# Patient Record
Sex: Female | Born: 1965 | Race: White | Hispanic: No | Marital: Married | Smoking: Former smoker
Health system: Southern US, Community
[De-identification: ages and names within clinical notes are randomized; demographics above are authoritative.]

## PROBLEM LIST (undated history)

## (undated) DIAGNOSIS — H359 Unspecified retinal disorder: Secondary | ICD-10-CM

## (undated) DIAGNOSIS — H269 Unspecified cataract: Secondary | ICD-10-CM

## (undated) DIAGNOSIS — N390 Urinary tract infection, site not specified: Secondary | ICD-10-CM

## (undated) DIAGNOSIS — Z87440 Personal history of urinary (tract) infections: Secondary | ICD-10-CM

## (undated) DIAGNOSIS — N200 Calculus of kidney: Secondary | ICD-10-CM

## (undated) DIAGNOSIS — N3281 Overactive bladder: Secondary | ICD-10-CM

## (undated) DIAGNOSIS — J42 Unspecified chronic bronchitis: Secondary | ICD-10-CM

## (undated) DIAGNOSIS — I10 Essential (primary) hypertension: Secondary | ICD-10-CM

## (undated) DIAGNOSIS — R32 Unspecified urinary incontinence: Secondary | ICD-10-CM

## (undated) DIAGNOSIS — K219 Gastro-esophageal reflux disease without esophagitis: Secondary | ICD-10-CM

## (undated) DIAGNOSIS — H3321 Serous retinal detachment, right eye: Secondary | ICD-10-CM

## (undated) DIAGNOSIS — Z8489 Family history of other specified conditions: Secondary | ICD-10-CM

## (undated) DIAGNOSIS — K8020 Calculus of gallbladder without cholecystitis without obstruction: Secondary | ICD-10-CM

## (undated) DIAGNOSIS — E669 Obesity, unspecified: Secondary | ICD-10-CM

## (undated) DIAGNOSIS — K635 Polyp of colon: Secondary | ICD-10-CM

## (undated) DIAGNOSIS — M1990 Unspecified osteoarthritis, unspecified site: Secondary | ICD-10-CM

## (undated) HISTORY — DX: Polyp of colon: K63.5

## (undated) HISTORY — DX: Urinary tract infection, site not specified: N39.0

## (undated) HISTORY — PX: CHOLECYSTECTOMY: SHX55

## (undated) HISTORY — PX: EYE SURGERY: SHX253

## (undated) HISTORY — DX: Serous retinal detachment, right eye: H33.21

## (undated) HISTORY — DX: Obesity, unspecified: E66.9

## (undated) HISTORY — DX: Calculus of gallbladder without cholecystitis without obstruction: K80.20

## (undated) HISTORY — PX: JOINT REPLACEMENT: SHX530

## (undated) HISTORY — DX: Unspecified cataract: H26.9

---

## 2006-09-26 HISTORY — PX: CYSTOSCOPY W/ STONE MANIPULATION: SHX1427

## 2011-10-14 HISTORY — PX: TUBAL LIGATION: SHX77

## 2012-03-28 ENCOUNTER — Emergency Department: Payer: BC Managed Care – PPO

## 2012-03-28 ENCOUNTER — Encounter: Payer: Self-pay | Admitting: *Deleted

## 2012-03-28 ENCOUNTER — Emergency Department (HOSPITAL_BASED_OUTPATIENT_CLINIC_OR_DEPARTMENT_OTHER): Payer: BC Managed Care – PPO

## 2012-03-28 ENCOUNTER — Emergency Department (INDEPENDENT_AMBULATORY_CARE_PROVIDER_SITE_OTHER): Payer: BC Managed Care – PPO

## 2012-03-28 ENCOUNTER — Emergency Department
Admission: EM | Admit: 2012-03-28 | Discharge: 2012-03-28 | Disposition: A | Discharge status: Home / Self Care | Payer: Self-pay | Source: Home / Self Care | Attending: Emergency Medicine | Admitting: Emergency Medicine

## 2012-03-28 DIAGNOSIS — X58XXXA Exposure to other specified factors, initial encounter: Secondary | ICD-10-CM

## 2012-03-28 DIAGNOSIS — M79646 Pain in unspecified finger(s): Secondary | ICD-10-CM

## 2012-03-28 DIAGNOSIS — M25539 Pain in unspecified wrist: Secondary | ICD-10-CM

## 2012-03-28 DIAGNOSIS — M654 Radial styloid tenosynovitis [de Quervain]: Secondary | ICD-10-CM

## 2012-03-28 DIAGNOSIS — M79609 Pain in unspecified limb: Secondary | ICD-10-CM

## 2012-03-28 DIAGNOSIS — S6980XA Other specified injuries of unspecified wrist, hand and finger(s), initial encounter: Secondary | ICD-10-CM

## 2012-03-28 DIAGNOSIS — S6990XA Unspecified injury of unspecified wrist, hand and finger(s), initial encounter: Secondary | ICD-10-CM

## 2012-03-28 HISTORY — DX: Unspecified urinary incontinence: R32

## 2012-03-28 HISTORY — DX: Unspecified retinal disorder: H35.9

## 2012-03-28 NOTE — ED Notes (Signed)
Pt states that she injured her RT thumb x 1 mth ago while mowing her yard. Since then she c/o pain, swelling, and heat. She has taken naproxen.
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
## 2012-03-29 NOTE — ED Provider Notes (Signed)
Agree with exam, assessment, and plan.   Lattie Haw, MD 03/29/12 [phone]

## 2012-04-06 ENCOUNTER — Emergency Department
Admission: EM | Admit: 2012-04-06 | Discharge: 2012-04-06 | Disposition: A | Discharge status: Home / Self Care | Payer: BC Managed Care – PPO | Source: Home / Self Care

## 2012-04-06 ENCOUNTER — Telehealth: Payer: Self-pay | Admitting: Family Medicine

## 2012-04-06 DIAGNOSIS — N390 Urinary tract infection, site not specified: Secondary | ICD-10-CM

## 2012-04-06 DIAGNOSIS — R82998 Other abnormal findings in urine: Secondary | ICD-10-CM

## 2012-04-06 DIAGNOSIS — R300 Dysuria: Secondary | ICD-10-CM

## 2012-04-06 LAB — POCT URINALYSIS DIP (MANUAL ENTRY)
Bilirubin, UA: NEGATIVE
Ketones, POC UA: NEGATIVE
Protein Ur, POC: 30
Spec Grav, UA: >=1.03 (ref 1.005–1.03)

## 2012-04-06 MED ORDER — CEPHALEXIN 500 MG PO CAPS: 500.0000 mg | ORAL_CAPSULE | Freq: Three times a day (TID) | ORAL | Status: AC

## 2012-04-06 NOTE — ED Provider Notes (Signed)
History     CSN: [id number]  Arrival date & time 04/06/12  1213   First MD Initiated Contact with Patient 04/06/12 1325      Chief Complaint  Patient presents with  . Dysuria    x 1 week  HPI Comments: DYSURIA Onset:  3-4 days Description: increased urination, change in urine color, worsening odor Modifying factors: none  Symptoms Urgency:  yes Frequency: no  Hesitancy:  no Hematuria:  no Flank Pain:  no Fever: no Nausea/Vomiting:  no Missed LMP: no STD exposure: no Discharge: minimal  Irritants: no Rash: no  Red Flags   More than 3 UTI's last 12 months:  no PMH of  Diabetes or Immunosuppression:  no Renal Disease/Calculi: yes; hx/o kidney stones in the past. Does not feel like previous UTIs.  Urinary Tract Abnormality:  no Instrumentation or Trauma: no  Pt reports having UTI approx 6 months ago with similar presentation treated with abx by PCP that resolved. No urine culture was done at that time.    Patient is a 46 y.o. female presenting with dysuria. The history is provided by the patient.  Dysuria  This is a new problem.    Past Medical History  Diagnosis Date  . Incontinence   . Retina disorder     Past Surgical History  Procedure Date  . Cesarean section   . Tubal ligation   . Kidney stone surgery     Family History  Problem Relation Age of Onset  . Hypertension Mother   . Depression Mother   . Hypertension Father   . Cancer Father     tongue/kidney    History  Substance Use Topics  . Smoking status: Former Games developer  . Smokeless tobacco: Not on file  . Alcohol Use: Yes    OB History    Grav Para Term Preterm Abortions TAB SAB Ect Mult Living                  Review of Systems  Genitourinary: Positive for dysuria.  All other systems reviewed and are negative.    Allergies  Review of patient's allergies indicates no known allergies.  Home Medications   Current Outpatient Rx  Name Route Sig Dispense Refill  . ASPIRIN 81  MG PO TABS Oral Take 81 mg by mouth daily.    Marland Kitchen ESTRACE PO Oral Take by mouth.    Marland Kitchen NAPROXEN SODIUM 220 MG PO TABS Oral Take 220 mg by mouth 2 (two) times daily with a meal.    . SOLIFENACIN SUCCINATE 10 MG PO TABS Oral Take 5 mg by mouth daily.    . CEPHALEXIN 500 MG PO CAPS Oral Take 1 capsule (500 mg total) by mouth 3 (three) times daily. 21 capsule 0    BP 146/85  Pulse 84  Temp 98.1 F (36.7 C) (Oral)  Resp 16  Ht 5\' 4"  (1.626 m)  Wt 238 lb (107.956 kg)  BMI 40.85 kg/m2  SpO2 96%  LMP 09/29/2011  Physical Exam  Constitutional: She appears well-developed and well-nourished.  HENT:  Head: Normocephalic and atraumatic.  Eyes: Conjunctivae are normal. Pupils are equal, round, and reactive to light.  Neck: Normal range of motion. Neck supple.  Cardiovascular: Normal rate and regular rhythm.   Pulmonary/Chest: Effort normal and breath sounds normal.  Abdominal: Soft. Bowel sounds are normal.       No suprapubic tenderness.    Musculoskeletal: Normal range of motion.  Neurological: She is alert.  ED Course  Procedures (including critical care time)   Labs Reviewed  POCT URINALYSIS DIP (MANUAL ENTRY) - Abnormal; Notable for the following:   URINE CULTURE   No results found.   1. Dark urine       MDM  Exam clinically consistent with UTI.  Will treat with keflex Urine culture.  Discussed infectious red flags.  Handout given.  Follow up as needed.     The patient and/or caregiver has been counseled thoroughly with regard to treatment plan and/or medications prescribed including dosage, schedule, interactions, rationale for use, and possible side effects and they verbalize understanding. Diagnoses and expected course of recovery discussed and will return if not improved as expected or if the condition worsens. Patient and/or caregiver verbalized understanding.              Floydene Flock, MD 04/06/12 1340

## 2012-04-06 NOTE — ED Provider Notes (Signed)
Agree with exam, assessment, and plan.   Lattie Haw, MD 04/06/12 [phone]

## 2012-04-06 NOTE — ED Notes (Signed)
Wanda Valencia complains of dark urine that has a foul odor for 1 week. Denies fever, chills or sweats.

## 2012-04-08 LAB — URINE CULTURE

## 2012-04-09 ENCOUNTER — Telehealth: Payer: Self-pay | Admitting: Family Medicine

## 2012-04-30 ENCOUNTER — Ambulatory Visit (INDEPENDENT_AMBULATORY_CARE_PROVIDER_SITE_OTHER): Payer: BC Managed Care – PPO | Admitting: Sports Medicine

## 2012-04-30 ENCOUNTER — Encounter: Payer: Self-pay | Admitting: Sports Medicine

## 2012-04-30 VITALS — BP 153/93 | HR 81 | Wt 239.0 lb

## 2012-04-30 DIAGNOSIS — M25549 Pain in joints of unspecified hand: Secondary | ICD-10-CM | POA: Insufficient documentation

## 2012-04-30 NOTE — Addendum Note (Signed)
Addended by: Monica Becton on: 04/30/2012 10:02 PM   Modules accepted: Level of Service
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
## 2012-04-30 NOTE — Patient Instructions (Signed)
Great to meet you. Continuing brace for another 2 weeks. Continue icing as needed. Stop Aleve, naproxen, ibuprofen, and Tylenol instead. Come back to see me in 2-3 weeks, and we can discuss. Wanda Valencia. Wanda Valencia, M.D.    .

## 2012-05-21 ENCOUNTER — Ambulatory Visit (INDEPENDENT_AMBULATORY_CARE_PROVIDER_SITE_OTHER): Payer: BC Managed Care – PPO | Admitting: Sports Medicine

## 2012-05-21 ENCOUNTER — Encounter: Payer: Self-pay | Admitting: Sports Medicine

## 2012-05-21 VITALS — BP 154/94 | HR 78 | Temp 98.2°F | Resp 16 | Wt 237.0 lb

## 2012-05-21 DIAGNOSIS — M25549 Pain in joints of unspecified hand: Secondary | ICD-10-CM

## 2012-05-21 NOTE — Progress Notes (Signed)
Patient ID: Wanda Valencia, female   DOB: 1966/07/16, 46 y.o.   MRN: [id number] Subjective:    CC: right thumb pain  HPI: overall pain is improved, not constant like before, but still present.  She localizes the to the right metacarpal phalangeal joint of the first digit.  She has been in immobilization in a thumb spica, and has been on oral NSAIDs.  She's also doing home rehabilitation exercises.  The pain is localized, and does not radiate.  Past medical history, Surgical history, Family history, Social history, Allergies, and medications have been entered into the medical record, reviewed, and no changes needed.   Review of Systems: No fevers, chills, night sweats, weight loss, chest pain, or shortness of breath.   Objective:    General: Well Developed, well nourished, and in no acute distress.  Neuro: Alert and oriented x3, extra-ocular muscles intact.  HEENT: Normocephalic, atraumatic, pupils equal round reactive to light, neck supple, no masses, no lymphadenopathy, thyroid nonpalpable.  Skin: Warm and dry, no rashes. Cardiac: Regular rate and rhythm, no murmurs rubs or gallops.  Respiratory: Clear to auscultation bilaterally. Not using accessory muscles, speaking in full sentences. Tender to palpation at the joint line at the first metacarpophalangeal joint.  Range of motion is good. She has a negative Finkelstein sign. There is no tenderness at the carpometacarpal joint.  Procedure: Real-time Ultrasound Guided Injection of right first metacarpophalangeal joint Device: GE Logiq E  Ultrasound guided injection is preferred based studies that show increased duration, increased effect, greater accuracy, decreased procedural pain, increased response rate, and decreased cost with ultrasound guided versus blind injection.  Verbal informed consent obtained.  Time-out conducted.  Noted no overlying erythema, induration, or other signs of local infection.  Skin prepped in a sterile fashion.    Local anesthesia: Topical Ethyl chloride.  With sterile technique and under real time ultrasound guidance:  Needle advanced in a short axis into the joint, 1/2 cc Kenalog 40, 1 cc lidocaine injected easily. Completed without difficulty  Pain immediately resolved suggesting accurate placement of the medication.  Advised to call if fevers/chills, erythema, induration, drainage, or persistent bleeding.  Images permanently stored and available for review in the ultrasound unit.  Impression: Technically successful ultrasound guided injection.  Impression and Recommendations:
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
## 2012-06-19 ENCOUNTER — Ambulatory Visit: Payer: BC Managed Care – PPO | Admitting: Sports Medicine

## 2012-07-17 ENCOUNTER — Ambulatory Visit (INDEPENDENT_AMBULATORY_CARE_PROVIDER_SITE_OTHER): Payer: BC Managed Care – PPO | Admitting: Sports Medicine

## 2012-07-17 ENCOUNTER — Ambulatory Visit (INDEPENDENT_AMBULATORY_CARE_PROVIDER_SITE_OTHER): Payer: BC Managed Care – PPO

## 2012-07-17 ENCOUNTER — Other Ambulatory Visit: Payer: Self-pay | Admitting: Sports Medicine

## 2012-07-17 ENCOUNTER — Encounter: Payer: Self-pay | Admitting: Sports Medicine

## 2012-07-17 VITALS — BP 161/88 | HR 84 | Temp 98.4°F | Resp 18 | Ht 63.0 in | Wt 237.0 lb

## 2012-07-17 DIAGNOSIS — M25562 Pain in left knee: Secondary | ICD-10-CM

## 2012-07-17 DIAGNOSIS — R030 Elevated blood-pressure reading, without diagnosis of hypertension: Secondary | ICD-10-CM

## 2012-07-17 DIAGNOSIS — R52 Pain, unspecified: Secondary | ICD-10-CM

## 2012-07-17 DIAGNOSIS — I10 Essential (primary) hypertension: Secondary | ICD-10-CM | POA: Insufficient documentation

## 2012-07-17 DIAGNOSIS — Z0000 Encounter for general adult medical examination without abnormal findings: Secondary | ICD-10-CM

## 2012-07-17 DIAGNOSIS — Z23 Encounter for immunization: Secondary | ICD-10-CM

## 2012-07-17 DIAGNOSIS — Z299 Encounter for prophylactic measures, unspecified: Secondary | ICD-10-CM

## 2012-07-17 DIAGNOSIS — M2240 Chondromalacia patellae, unspecified knee: Secondary | ICD-10-CM | POA: Insufficient documentation

## 2012-07-17 DIAGNOSIS — M25569 Pain in unspecified knee: Secondary | ICD-10-CM

## 2012-07-17 DIAGNOSIS — M1710 Unilateral primary osteoarthritis, unspecified knee: Secondary | ICD-10-CM

## 2012-07-17 DIAGNOSIS — M25549 Pain in joints of unspecified hand: Secondary | ICD-10-CM

## 2012-07-17 DIAGNOSIS — E669 Obesity, unspecified: Secondary | ICD-10-CM | POA: Insufficient documentation

## 2012-07-17 LAB — CBC
HCT: 41.5 % (ref 36.0–46.0)
Hemoglobin: 14.3 g/dL (ref 12.0–15.0)
MCH: 30.6 pg (ref 26.0–34.0)
MCHC: 34.5 g/dL (ref 30.0–36.0)
MCV: 88.9 fL (ref 78.0–100.0)
Platelets: 317 K/uL (ref 150–400)
RBC: 4.67 MIL/uL (ref 3.87–5.11)
RDW: 14 % (ref 11.5–15.5)
WBC: 6 10*3/uL (ref 4.0–10.5)

## 2012-07-17 LAB — HEMOGLOBIN A1C
Hgb A1c MFr Bld: 5.7 % — ABNORMAL HIGH (ref <5.7)
Mean Plasma Glucose: 117 mg/dL — ABNORMAL HIGH (ref <117)

## 2012-07-17 LAB — COMPREHENSIVE METABOLIC PANEL
Albumin: 4.4 g/dL (ref 3.5–5.2)
Alkaline Phosphatase: 90 U/L (ref 39–117)
BUN: 14 mg/dL (ref 6–23)
CO2: 27 mEq/L (ref 19–32)
Chloride: 105 mEq/L (ref 96–112)
Glucose, Bld: 87 mg/dL (ref 70–99)
Potassium: 4.6 mEq/L (ref 3.5–5.3)
Sodium: 140 mEq/L (ref 135–145)
Total Protein: 6.7 g/dL (ref 6.0–8.3)

## 2012-07-17 LAB — LIPID PANEL
Cholesterol: 199 mg/dL (ref 0–200)
HDL: 51 mg/dL (ref >39)
LDL Cholesterol: 130 mg/dL — ABNORMAL HIGH (ref 0–99)
Total CHOL/HDL Ratio: 3.9 Ratio
Triglycerides: 92 mg/dL (ref <150)
VLDL: 18 mg/dL (ref 0–40)

## 2012-07-17 LAB — TSH: TSH: 2.126 u[IU]/mL (ref 0.350–4.500)

## 2012-07-17 LAB — COMPREHENSIVE METABOLIC PANEL WITH GFR
ALT: 33 U/L (ref 0–35)
AST: 27 U/L (ref 0–37)
Calcium: 9.7 mg/dL (ref 8.4–10.5)
Creat: 0.72 mg/dL (ref 0.50–1.10)
Total Bilirubin: 0.5 mg/dL (ref 0.3–1.2)

## 2012-07-17 LAB — CHG MAMMOGRAM, SCREENING

## 2012-07-17 MED ORDER — PHENTERMINE HCL 37.5 MG PO TABS: 37.5000 mg | ORAL_TABLET | Freq: Every day | ORAL | Status: DC

## 2012-07-17 MED ORDER — MELOXICAM 15 MG PO TABS: ORAL_TABLET | ORAL | Status: DC

## 2012-07-17 NOTE — Patient Instructions (Signed)
Check out the DASH diet = 1.5 Gram Low Sodium Diet   A 1.5 gram sodium diet restricts the amount of sodium in the diet to no more than 1.5 g or 1500 mg daily.  The American Heart Association recommends Americans over the age of 20 to consume no more than 1500 mg of sodium each day to reduce the risk of developing high blood pressure.  Research also shows that limiting sodium may reduce heart attack and stroke risk.  Many foods contain sodium for flavor and sometimes as a preservative.  When the amount of sodium in a diet needs to be low, it is important to know what to look for when choosing foods and drinks.  The following includes some information and guidelines to help make it easier for you to adapt to a low sodium diet.    QUICK TIPS  Do not add salt to food.  Avoid convenience items and fast food.  Choose unsalted snack foods.  Buy lower sodium products, often labeled as "lower sodium" or "no salt added."  Check food labels to learn how much sodium is in 1 serving.  When eating at a restaurant, ask that your food be prepared with less salt or none, if possible.    READING FOOD LABELS FOR SODIUM INFORMATION  The nutrition facts label is a good place to find how much sodium is in foods. Look for products with no more than 400 mg of sodium per serving.  Remember that 1.5 g = 1500 mg.  The food label may also list foods as:  Sodium-free: Less than 5 mg in a serving.  Very low sodium: 35 mg or less in a serving.  Low-sodium: 140 mg or less in a serving.  Light in sodium: 50% less sodium in a serving. For example, if a food that usually has 300 mg of sodium is changed to become light in sodium, it will have 150 mg of sodium.  Reduced sodium: 25% less sodium in a serving. For example, if a food that usually has 400 mg of sodium is changed to reduced sodium, it will have 300 mg of sodium.    CHOOSING FOODS  Grains  Avoid: Salted crackers and snack items. Some cereals, including instant hot  cereals. Bread stuffing and biscuit mixes. Seasoned rice or pasta mixes.  Choose: Unsalted snack items. Low-sodium cereals, oats, puffed wheat and rice, shredded wheat. English muffins and bread. Pasta.  Meats  Avoid: Salted, canned, smoked, spiced, pickled meats, including fish and poultry. Bacon, ham, sausage, cold cuts, hot dogs, anchovies.  Choose: Low-sodium canned tuna and salmon. Fresh or frozen meat, poultry, and fish.  Dairy  Avoid: Processed cheese and spreads. Cottage cheese. Buttermilk and condensed milk. Regular cheese.  Choose: Milk. Low-sodium cottage cheese. Yogurt. Sour cream. Low-sodium cheese.  Fruits and Vegetables  Avoid: Regular canned vegetables. Regular canned tomato sauce and paste. Frozen vegetables in sauces. Olives. Pickles. Relishes. Sauerkraut.  Choose: Low-sodium canned vegetables. Low-sodium tomato sauce and paste. Frozen or fresh vegetables. Fresh and frozen fruit.  Condiments  Avoid: Canned and packaged gravies. Worcestershire sauce. Tartar sauce. Barbecue sauce. Soy sauce. Steak sauce. Ketchup. Onion, garlic, and table salt. Meat flavorings and tenderizers.  Choose: Fresh and dried herbs and spices. Low-sodium varieties of mustard and ketchup. Lemon juice. Tabasco sauce. Horseradish.    SAMPLE 1.5 GRAM SODIUM MEAL PLAN:   Breakfast / Sodium (mg)  1 cup low-fat milk / 143 mg  1 whole-wheat English muffin / 240 mg    1 tbs heart-healthy margarine / 153 mg  1 hard-boiled egg / 139 mg  1 small orange / 0 mg  Lunch / Sodium (mg)  1 cup raw carrots / 76 mg  2 tbs no salt added peanut butter / 5 mg  2 slices whole-wheat bread / 270 mg  1 tbs jelly / 6 mg   cup red grapes / 2 mg  Dinner / Sodium (mg)  1 cup whole-wheat pasta / 2 mg  1 cup low-sodium tomato sauce / 73 mg  3 oz lean ground beef / 57 mg  1 small side salad (1 cup raw spinach leaves,  cup cucumber,  cup yellow bell pepper) with 1 tsp olive oil and 1 tsp red wine vinegar / 25 mg  Snack /  Sodium (mg)  1 container low-fat vanilla yogurt / 107 mg  3 graham cracker squares / 127 mg  Nutrient Analysis  Calories: 1745  Protein: 75 g  Carbohydrate: 237 g  Fat: 57 g  Sodium: 1425 mg  Document Released: 09/12/2005 Document Revised: 05/25/2011 Document Reviewed: 12/14/2009  [employer] Patient Information 2012 [employer].   

## 2012-07-17 NOTE — Assessment & Plan Note (Signed)
Physical exam normal. Some acrochordons present, she will come back to have these removed. Checking lipids, CMET, CBC, TSH, A1c.

## 2012-07-17 NOTE — Assessment & Plan Note (Signed)
Start phentermine. We'll followup monthly for weight checks, and blood pressure checks.

## 2012-07-17 NOTE — Assessment & Plan Note (Signed)
DASH diet. We'll recheck at subsequent visits, if no better will need to start lisinopril/Hydrochlorothiazide.

## 2012-07-17 NOTE — Assessment & Plan Note (Signed)
With gelling. 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 We'll x-ray her knee, and start Mobic. Home rehabilitation exercises will be printed out.

## 2012-07-17 NOTE — Progress Notes (Signed)
Subjective:    CC: CPE.  HPI:  Obesity: Has been on phentermine in the past, she was only on this for 3 months, and lost 15-20 pounds. Is interested in trying this again.  Elevated blood pressure: No specific diagnosis yet of hypertension. No headaches, chest pain, lower extremity swelling.  Left knee pain: Present for many years, particularly bad now that she is put on some weight. Pain is localized in the anterior knee. She exhibits stiffness in the morning, as well as when sitting for long periods of time. No imaging, or workup done yet for this.  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 Pain continues to be 100% resolved.  Preventive: Needs flu shot, up-to-date on tetanus, mammogram, and Pap smears.  Past medical history, Surgical history, Family history, Social history, Allergies, and medications have been entered into the medical record, reviewed, and no changes needed.   Review of Systems: No headache, visual changes, nausea, vomiting, diarrhea, constipation, dizziness, abdominal pain, skin rash, fevers, chills, night sweats, swollen lymph nodes, weight loss, chest pain, body aches, joint swelling, muscle aches, or shortness of breath.   Objective:    General: Well Developed, well nourished, and in no acute distress.  Neuro: Alert and oriented x3, extra-ocular muscles intact.  HEENT: Normocephalic, atraumatic, pupils equal round reactive to light, neck supple, no masses, no lymphadenopathy, thyroid nonpalpable.  Skin: Warm and dry, no rashes noted. Multiple acrochordons noted on back. Cardiac: Regular rate and rhythm, no murmurs rubs or gallops.  Respiratory: Clear to auscultation bilaterally. Not using accessory muscles, speaking in full sentences.  Abdominal: Soft, nontender, nondistended, positive bowel sounds, no masses, no organomegaly.  Musculoskeletal: Shoulder, elbow, wrist, hip, knee,  ankle stable, and with full range of motion.  Impression and Recommendations:

## 2012-07-17 NOTE — Assessment & Plan Note (Signed)
Pain completely resolved status post ultrasound guided injection.

## 2012-07-18 ENCOUNTER — Encounter: Payer: Self-pay | Admitting: Sports Medicine

## 2012-08-20 ENCOUNTER — Ambulatory Visit (INDEPENDENT_AMBULATORY_CARE_PROVIDER_SITE_OTHER): Payer: BC Managed Care – PPO | Admitting: Sports Medicine

## 2012-08-20 ENCOUNTER — Encounter: Payer: Self-pay | Admitting: Sports Medicine

## 2012-08-20 VITALS — BP 160/94 | HR 84 | Wt 240.0 lb

## 2012-08-20 DIAGNOSIS — M25569 Pain in unspecified knee: Secondary | ICD-10-CM

## 2012-08-20 DIAGNOSIS — M25562 Pain in left knee: Secondary | ICD-10-CM

## 2012-08-20 NOTE — Assessment & Plan Note (Signed)
Ultrasound guided injection as above. Mobic as needed. Come back to see me in one month.

## 2012-08-20 NOTE — Progress Notes (Signed)
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  Pain is localized along the medial and lateral joint lines, she does get a significant amount of gelling. The pain is localized, does not radiate.  Past medical history, Surgical history, Family history, Social history, Allergies, and medications have been entered into the medical record, reviewed, and no changes needed.   Review of Systems: No headache, visual changes, nausea, vomiting, diarrhea, constipation, dizziness, abdominal pain, skin rash, fevers, chills, night sweats, weight loss, swollen lymph nodes, body aches, joint swelling, muscle aches, chest pain, or shortness of breath.   Objective:   Vitals:  Afebrile, vital signs stable. General: Well Developed, well nourished, and in no acute distress.  Neuro/Psych: Alert and oriented x3, extra-ocular muscles intact, able to move all 4 extremities.  Skin: Warm and dry, no rashes noted.  Respiratory: Not using accessory muscles, speaking in full sentences, trachea midline.  Cardiovascular: Pulses palpable, no extremity edema. Abdomen: Does not appear distended. Left Knee: Normal to inspection with no erythema or effusion or obvious bony abnormalities. Medial and lateral joint line pain. ROM full in flexion and extension and lower leg rotation. Ligaments with solid consistent endpoints including ACL, PCL, LCL, MCL. Negative Mcmurray's, Apley's, and Thessalonian tests. Non painful patellar compression. Patellar glide without crepitus. Patellar and quadriceps tendons unremarkable. Hamstring and quadriceps strength is normal.   Procedure: Real-time Ultrasound Guided Injection of left knee Device: GE Logiq E  Ultrasound guided injection is preferred based studies that show increased  duration, increased effect, greater accuracy, decreased procedural pain, increased response rate, and decreased cost with ultrasound guided versus blind injection.  Verbal informed consent obtained.  Time-out conducted.  Noted no overlying erythema, induration, or other signs of local infection.  Skin prepped in a sterile fashion.  Local anesthesia: Topical Ethyl chloride.  With sterile technique and under real time ultrasound guidance:  2 cc Kenalog 40, 4 cc lidocaine injected into the suprapatellar recess. Completed without difficulty  Pain immediately resolved suggesting accurate placement of the medication.  Advised to call if fevers/chills, erythema, induration, drainage, or persistent bleeding.  Images permanently stored and available for review in the ultrasound unit.  Impression: Technically successful ultrasound guided injection.  Impression and Recommendations:   This case required medical decision making of moderate complexity.

## 2012-09-24 ENCOUNTER — Ambulatory Visit: Payer: BC Managed Care – PPO | Admitting: Sports Medicine

## 2012-11-29 ENCOUNTER — Encounter: Payer: Self-pay | Admitting: Sports Medicine

## 2012-11-29 ENCOUNTER — Ambulatory Visit (INDEPENDENT_AMBULATORY_CARE_PROVIDER_SITE_OTHER): Payer: BC Managed Care – PPO | Admitting: Sports Medicine

## 2012-11-29 ENCOUNTER — Other Ambulatory Visit: Payer: Self-pay | Admitting: Sports Medicine

## 2012-11-29 VITALS — BP 153/93 | HR 80 | Temp 98.0°F | Wt 234.0 lb

## 2012-11-29 DIAGNOSIS — R030 Elevated blood-pressure reading, without diagnosis of hypertension: Secondary | ICD-10-CM

## 2012-11-29 DIAGNOSIS — E785 Hyperlipidemia, unspecified: Secondary | ICD-10-CM

## 2012-11-29 DIAGNOSIS — J0100 Acute maxillary sinusitis, unspecified: Secondary | ICD-10-CM

## 2012-11-29 MED ORDER — FLUTICASONE PROPIONATE 50 MCG/ACT NA SUSP: NASAL | Status: DC

## 2012-11-29 MED ORDER — AMOXICILLIN-POT CLAVULANATE 875-125 MG PO TABS: 1.0000 | ORAL_TABLET | Freq: Two times a day (BID) | ORAL | Status: DC

## 2012-11-29 NOTE — Assessment & Plan Note (Signed)
Rechecking at next visit, if continues to be elevated I am going to add Lipitor.

## 2012-11-29 NOTE — Assessment & Plan Note (Signed)
Augmentin, Flonase. 

## 2012-11-29 NOTE — Assessment & Plan Note (Signed)
Still elevated but currently ill. If continues to be elevated next visit I will treat.

## 2012-11-29 NOTE — Progress Notes (Signed)
Subjective:    CC: Sinus congestion  HPI: Malin is a pleasant 47 year-old lady who presents with a history of sinus congestion, cough, headache, and sore throat for three weeks. She also complains of maxillary sinus pressure and left-sided otalgia. Her symptoms had nearly subsided until two days ago, at which time her sinus congestion and pressure worsened. She has been taking Nyquil, Dayquil, and Sudafed, which have helped to relieve her symptoms until the past two days.   Past medical history, Surgical history, Family history not pertinant except as noted below, Social history, Allergies, and medications have been entered into the medical record, reviewed, and no changes needed.   Review of Systems: No fevers, chills, night sweats, weight loss, chest pain, or shortness of breath.   Objective:    General: Well Developed, well nourished, and in no acute distress.  Neuro: Alert and oriented x3, extra-ocular muscles intact, sensation grossly intact.  HEENT: Normocephalic, atraumatic, pupils equal round reactive to light, neck supple, no masses, no lymphadenopathy, thyroid nonpalpable. TMs normal bilaterally, mild TTP over maxillary sinuses; throat erythematous with no exudate. Skin: Warm and dry, no rashes. Cardiac: Regular rate and rhythm, no murmurs rubs or gallops.  Respiratory: Clear to auscultation bilaterally, no wheezes or crackles. Not using accessory muscles, speaking in full sentences.  I was present for all essential parts of this visit and procedure. Ihor Austin. Benjamin Stain, M.D. Impression and Recommendations:

## 2013-01-07 ENCOUNTER — Ambulatory Visit (INDEPENDENT_AMBULATORY_CARE_PROVIDER_SITE_OTHER): Payer: BC Managed Care – PPO | Admitting: Sports Medicine

## 2013-01-07 ENCOUNTER — Encounter: Payer: Self-pay | Admitting: Sports Medicine

## 2013-01-07 VITALS — BP 180/99 | HR 82 | Wt 235.0 lb

## 2013-01-07 DIAGNOSIS — E785 Hyperlipidemia, unspecified: Secondary | ICD-10-CM

## 2013-01-07 DIAGNOSIS — M2240 Chondromalacia patellae, unspecified knee: Secondary | ICD-10-CM

## 2013-01-07 DIAGNOSIS — I10 Essential (primary) hypertension: Secondary | ICD-10-CM

## 2013-01-07 MED ORDER — LISINOPRIL-HYDROCHLOROTHIAZIDE 20-25 MG PO TABS: 1.0000 | ORAL_TABLET | Freq: Every day | ORAL | Status: DC

## 2013-01-07 NOTE — Progress Notes (Signed)
  Subjective:    CC: Followup  HPI: Elevated blood pressure: Does not have a previous diagnosis of hypertension, but the last time I saw her we talked about the DASH plan which she's been working on. Unfortunately her blood pressure is not any better. She denies any headaches, chest pain, visual changes.  Hyperlipidemia: Has been working on dietary changes. Present for years likely.  Knee pain: Bilateral, the right side was injected sometime ago, pain has resolved, she has not done any exercises. Currently she is pain that she localizes in the anterior left knee, worse with flexion, and worse when trying to get up out of a chair. No mechanical symptoms. She does desire injection.  Past medical history, Surgical history, Family history not pertinant except as noted below, Social history, Allergies, and medications have been entered into the medical record, reviewed, and no changes needed.   Review of Systems: No fevers, chills, night sweats, weight loss, chest pain, or shortness of breath.   Objective:    General: Well Developed, well nourished, and in no acute distress.  Neuro: Alert and oriented x3, extra-ocular muscles intact, sensation grossly intact.  HEENT: Normocephalic, atraumatic, pupils equal round reactive to light, neck supple, no masses, no lymphadenopathy, thyroid nonpalpable.  Skin: Warm and dry, no rashes. Cardiac: Regular rate and rhythm, no murmurs rubs or gallops, no lower extremity edema.  Respiratory: Clear to auscultation bilaterally. Not using accessory muscles, speaking in full sentences.  Procedure: Real-time Ultrasound Guided Injection of left knee. Device: GE Logiq E  Ultrasound guided injection is preferred based studies that show increased duration, increased effect, greater accuracy, decreased procedural pain, increased response rate, and decreased cost with ultrasound guided versus blind injection.  Verbal informed consent obtained.  Time-out conducted.    Noted no overlying erythema, induration, or other signs of local infection.  Skin prepped in a sterile fashion.  Local anesthesia: Topical Ethyl chloride.  With sterile technique and under real time ultrasound guidance:  2 cc Kenalog 40, 4 cc lidocaine injected into the suprapatellar recess. Completed without difficulty  Pain immediately resolved suggesting accurate placement of the medication.  Advised to call if fevers/chills, erythema, induration, drainage, or persistent bleeding.  Images permanently stored and available for review in the ultrasound unit.  Impression: Technically successful ultrasound guided injection.  Impression and Recommendations:

## 2013-01-07 NOTE — Assessment & Plan Note (Addendum)
Right knee is doing well status post injection. I injected left knee today with ultrasound guidance. Strapped knee with compressive bandage. Formal physical therapy, return in 4 weeks for this.

## 2013-01-07 NOTE — Assessment & Plan Note (Signed)
Has had several months for dietary modification. She will come back in 2 weeks at which point we will need to check her creatinine is I'm starting her on an ACE inhibitor. His lipids are still elevated we will consider statin.

## 2013-01-07 NOTE — Assessment & Plan Note (Signed)
Lisinopril/hydrochlorothiazide. Return in 2 weeks, we can on medications at that time, and recheck her BMET and likely lipids. She does want blood work sent to an outside facility.

## 2013-01-14 ENCOUNTER — Encounter: Payer: Self-pay | Admitting: Physical Therapy

## 2013-01-18 ENCOUNTER — Ambulatory Visit (INDEPENDENT_AMBULATORY_CARE_PROVIDER_SITE_OTHER): Payer: BC Managed Care – PPO | Admitting: Physical Therapy

## 2013-01-18 DIAGNOSIS — M25569 Pain in unspecified knee: Secondary | ICD-10-CM

## 2013-01-18 DIAGNOSIS — M25669 Stiffness of unspecified knee, not elsewhere classified: Secondary | ICD-10-CM

## 2013-01-18 DIAGNOSIS — M2240 Chondromalacia patellae, unspecified knee: Secondary | ICD-10-CM

## 2013-01-18 DIAGNOSIS — M6281 Muscle weakness (generalized): Secondary | ICD-10-CM

## 2013-01-22 ENCOUNTER — Encounter (INDEPENDENT_AMBULATORY_CARE_PROVIDER_SITE_OTHER): Payer: BC Managed Care – PPO | Admitting: Physical Therapy

## 2013-01-22 DIAGNOSIS — M2240 Chondromalacia patellae, unspecified knee: Secondary | ICD-10-CM

## 2013-01-22 DIAGNOSIS — M25669 Stiffness of unspecified knee, not elsewhere classified: Secondary | ICD-10-CM

## 2013-01-22 DIAGNOSIS — M25569 Pain in unspecified knee: Secondary | ICD-10-CM

## 2013-01-22 DIAGNOSIS — M6281 Muscle weakness (generalized): Secondary | ICD-10-CM

## 2013-01-23 ENCOUNTER — Encounter: Payer: Self-pay | Admitting: Sports Medicine

## 2013-01-23 ENCOUNTER — Ambulatory Visit (INDEPENDENT_AMBULATORY_CARE_PROVIDER_SITE_OTHER): Payer: BC Managed Care – PPO | Admitting: Sports Medicine

## 2013-01-23 VITALS — BP 119/82 | HR 81

## 2013-01-23 DIAGNOSIS — I10 Essential (primary) hypertension: Secondary | ICD-10-CM

## 2013-01-23 DIAGNOSIS — E785 Hyperlipidemia, unspecified: Secondary | ICD-10-CM

## 2013-01-23 DIAGNOSIS — M2240 Chondromalacia patellae, unspecified knee: Secondary | ICD-10-CM

## 2013-01-23 NOTE — Assessment & Plan Note (Signed)
Pain free. Continue physical therapy. Return as needed for this.

## 2013-01-23 NOTE — Progress Notes (Signed)
  Subjective:    CC: Followup  HPI: Hypertension: Well controlled. No adverse effects from the medication, stable.  Hyperlipidemia: Due for recheck. Has been stable.  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  Past medical history, Surgical history, Family history not pertinant except as noted below, Social history, Allergies, and medications have been entered into the medical record, reviewed, and no changes needed.   Review of Systems: No fevers, chills, night sweats, weight loss, chest pain, or shortness of breath.   Objective:    General: Well Developed, well nourished, and in no acute distress.  Neuro: Alert and oriented x3, extra-ocular muscles intact, sensation grossly intact.  HEENT: Normocephalic, atraumatic, pupils equal round reactive to light, neck supple, no masses, no lymphadenopathy, thyroid nonpalpable.  Skin: Warm and dry, no rashes. Cardiac: Regular rate and rhythm, no murmurs rubs or gallops, 1+ lower extremity edema.  Respiratory: Clear to auscultation bilaterally. Not using accessory muscles, speaking in full sentences. Impression and Recommendations:

## 2013-01-23 NOTE — Assessment & Plan Note (Signed)
Rechecking lipids. 

## 2013-01-23 NOTE — Assessment & Plan Note (Signed)
Well-controlled. Rechecking BMET

## 2013-01-24 LAB — BASIC METABOLIC PANEL WITH GFR
CO2: 26 meq/L (ref 19–32)
Calcium: 9.8 mg/dL (ref 8.4–10.5)
Glucose, Bld: 98 mg/dL (ref 70–99)
Potassium: 4.2 meq/L (ref 3.5–5.3)
Sodium: 141 meq/L (ref 135–145)

## 2013-01-24 LAB — LIPID PANEL
Cholesterol: 176 mg/dL (ref 0–200)
HDL: 47 mg/dL (ref >39)
LDL Cholesterol: 108 mg/dL — ABNORMAL HIGH (ref 0–99)
Total CHOL/HDL Ratio: 3.7 ratio
Triglycerides: 105 mg/dL (ref <150)
VLDL: 21 mg/dL (ref 0–40)

## 2013-01-24 LAB — BASIC METABOLIC PANEL
BUN: 17 mg/dL (ref 6–23)
Chloride: 106 mEq/L (ref 96–112)
Creat: 0.82 mg/dL (ref 0.50–1.10)

## 2013-01-25 ENCOUNTER — Encounter (INDEPENDENT_AMBULATORY_CARE_PROVIDER_SITE_OTHER): Payer: BC Managed Care – PPO | Admitting: Physical Therapy

## 2013-01-25 DIAGNOSIS — M25669 Stiffness of unspecified knee, not elsewhere classified: Secondary | ICD-10-CM

## 2013-01-25 DIAGNOSIS — M6281 Muscle weakness (generalized): Secondary | ICD-10-CM

## 2013-01-25 DIAGNOSIS — M25569 Pain in unspecified knee: Secondary | ICD-10-CM

## 2013-01-25 DIAGNOSIS — M2240 Chondromalacia patellae, unspecified knee: Secondary | ICD-10-CM

## 2013-01-28 ENCOUNTER — Encounter: Payer: BC Managed Care – PPO | Admitting: Physical Therapy

## 2013-02-01 ENCOUNTER — Encounter: Payer: BC Managed Care – PPO | Admitting: Physical Therapy

## 2013-02-04 ENCOUNTER — Encounter: Payer: BC Managed Care – PPO | Admitting: Physical Therapy

## 2013-03-22 ENCOUNTER — Ambulatory Visit (INDEPENDENT_AMBULATORY_CARE_PROVIDER_SITE_OTHER): Payer: BC Managed Care – PPO | Admitting: Sports Medicine

## 2013-03-22 VITALS — BP 115/74 | HR 86 | Wt 235.0 lb

## 2013-03-22 DIAGNOSIS — M25541 Pain in joints of right hand: Secondary | ICD-10-CM

## 2013-03-22 DIAGNOSIS — M25549 Pain in joints of unspecified hand: Secondary | ICD-10-CM

## 2013-03-22 NOTE — Progress Notes (Signed)
  Subjective:    CC: Right thumb pain  HPI: I injected Wanda Valencia's right first metacarpophalangeal joint approximately 8 months ago. She had an excellent response and so recently, and desires repeat injection. Pain is localized, doesn't radiate, moderate.  Past medical history, Surgical history, Family history not pertinant except as noted below, Social history, Allergies, and medications have been entered into the medical record, reviewed, and no changes needed.   Review of Systems: No fevers, chills, night sweats, weight loss, chest pain, or shortness of breath.   Objective:    General: Well Developed, well nourished, and in no acute distress.  Neuro: Alert and oriented x3, extra-ocular muscles intact, sensation grossly intact.  HEENT: Normocephalic, atraumatic, pupils equal round reactive to light, neck supple, no masses, no lymphadenopathy, thyroid nonpalpable.  Skin: Warm and dry, no rashes. Cardiac: Regular rate and rhythm, no murmurs rubs or gallops, no lower extremity edema.  Respiratory: Clear to auscultation bilaterally. Not using accessory muscles, speaking in full sentences.  Procedure: Real-time Ultrasound Guided Injection of right first metacarpophalangeal joint Device: GE Logiq E  Verbal informed consent obtained.  Time-out conducted.  Noted no overlying erythema, induration, or other signs of local infection.  Skin prepped in a sterile fashion.  Local anesthesia: Topical Ethyl chloride.  With sterile technique and under real time ultrasound guidance:  Needle advanced into the joint, 0.5 cc Kenalog 40, 1 cc lidocaine injected easily. Completed without difficulty  Pain immediately resolved suggesting accurate placement of the medication.  Advised to call if fevers/chills, erythema, induration, drainage, or persistent bleeding.  Images permanently stored and available for review in the ultrasound unit.  Impression: Technically successful ultrasound guided  injection.  Impression and Recommendations:

## 2013-03-22 NOTE — Assessment & Plan Note (Signed)
Right first metacarpal phalangeal joint injection as above. The last one lasted for 8 months. Return as needed.

## 2013-03-27 ENCOUNTER — Other Ambulatory Visit: Payer: Self-pay | Admitting: Sports Medicine

## 2013-04-04 ENCOUNTER — Encounter: Payer: Self-pay | Admitting: Sports Medicine

## 2013-04-04 ENCOUNTER — Telehealth: Payer: Self-pay

## 2013-04-04 NOTE — Telephone Encounter (Signed)
Pt notified letter was ready for pickup.  

## 2013-04-04 NOTE — Telephone Encounter (Signed)
Letter in outbox

## 2013-04-04 NOTE — Telephone Encounter (Signed)
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 she is seeing you for knee injections, she needs a letter stating that she is seeing you for knee problems so she can park closer to the building. This is only to last for a few weeks. Delton Stelle,CMA

## 2013-04-07 ENCOUNTER — Emergency Department
Admission: EM | Admit: 2013-04-07 | Discharge: 2013-04-07 | Disposition: A | Discharge status: Home / Self Care | Payer: BC Managed Care – PPO | Source: Home / Self Care | Attending: Family Medicine | Admitting: Family Medicine

## 2013-04-07 DIAGNOSIS — N3000 Acute cystitis without hematuria: Secondary | ICD-10-CM

## 2013-04-07 DIAGNOSIS — R300 Dysuria: Secondary | ICD-10-CM

## 2013-04-07 HISTORY — DX: Essential (primary) hypertension: I10

## 2013-04-07 HISTORY — DX: Overactive bladder: N32.81

## 2013-04-07 LAB — POCT URINALYSIS DIP (MANUAL ENTRY)
Ketones, POC UA: NEGATIVE
Nitrite, UA: NEGATIVE
Protein Ur, POC: NEGATIVE
Urobilinogen, UA: 0.2 (ref 0–1)

## 2013-04-07 MED ORDER — CEPHALEXIN 250 MG/5ML PO SUSR: ORAL | Status: DC

## 2013-04-07 NOTE — ED Notes (Signed)
Start Friday night with burning during urination

## 2013-04-07 NOTE — ED Provider Notes (Signed)
History    CSN: [id number] Arrival date & time 04/07/13  1126  First MD Initiated Contact with Patient 04/07/13 1241     Chief Complaint  Patient presents with  . Dysuria      HPI Comments: Patient complains of onset of dysuria two days ago.  She has a history of overactive bladder.  She has had mild nausea but no vomiting.  Patient is a 47 y.o. female presenting with dysuria. The history is provided by the patient.  Dysuria Pain quality:  Burning Pain severity:  Mild Onset quality:  Sudden Duration:  2 days Timing:  Intermittent Progression:  Worsening Chronicity:  New Recent urinary tract infections: no   Relieved by:  Nothing Worsened by:  Nothing tried Ineffective treatments:  None tried Urinary symptoms: no discolored urine, no foul-smelling urine, no frequent urination, no hematuria, no hesitancy and no bladder incontinence   Associated symptoms: nausea   Associated symptoms: no abdominal pain, no fever, no flank pain, no genital lesions, no vaginal discharge and no vomiting    Past Medical History  Diagnosis Date  . Incontinence   . Retina disorder   . Hypertension   . Overactive bladder    Past Surgical History  Procedure Laterality Date  . Cesarean section    . Tubal ligation    . Kidney stone surgery    . Tubal ligation  10/14/2011   Family History  Problem Relation Age of Onset  . Hypertension Mother   . Depression Mother   . Hypertension Father   . Cancer Father     tongue/kidney   History  Substance Use Topics  . Smoking status: Former Games developer  . Smokeless tobacco: Not on file  . Alcohol Use: Yes   OB History   Grav Para Term Preterm Abortions TAB SAB Ect Mult Living                 Review of Systems  Constitutional: Negative for fever.  Gastrointestinal: Positive for nausea. Negative for vomiting and abdominal pain.  Genitourinary: Positive for dysuria. Negative for flank pain and vaginal discharge.  All other systems reviewed and are  negative.    Allergies  Review of patient's allergies indicates no known allergies.  Home Medications   Current Outpatient Rx  Name  Route  Sig  Dispense  Refill  . acetaminophen (TYLENOL) 500 MG tablet   Oral   Take 1,000 mg by mouth every 6 (six) hours as needed.         Marland Kitchen aspirin 81 MG tablet   Oral   Take 81 mg by mouth daily.         . cephALEXin (KEFLEX) 250 MG/5ML suspension      Take 10mL by mouth twice daily.   140 mL   0   . lisinopril-hydrochlorothiazide (PRINZIDE,ZESTORETIC) 20-25 MG per tablet   Oral   Take 1 tablet by mouth daily.   30 tablet   3   . meloxicam (MOBIC) 15 MG tablet      TAKE 1 TABLET BY MOUTH EVERY MORNING WITH BREAKFAST FOR 2 WEEKS THEN DAILY AS NEEDED FOR PAIN   90 tablet   3   . MYRBETRIQ 25 MG TB24      25 mg daily.           BP 112/76  Pulse 75  Temp(Src) 98.1 F (36.7 C) (Oral)  Ht 5\' 4"  (1.626 m)  Wt 232 lb 8 oz (105.461 kg)  BMI 39.89  kg/m2  SpO2 97%  LMP 09/29/2011 Physical Exam Nursing notes and Vital Signs reviewed. Appearance:  Patient appears stated age, and in no acute distress.  Patient is obese (BMI 39.9) Eyes:  Pupils are equal, round, and reactive to light and accomodation.  Extraocular movement is intact.  Conjunctivae are not inflamed  Ears:  Canals normal.  Tympanic membranes normal.  Nose:   Normal turbinates.  No sinus tenderness.   Pharynx:  Normal Neck:  Supple.  No adenopathy Lungs:  Clear to auscultation.  Breath sounds are equal.  Heart:  Regular rate and rhythm without murmurs, rubs, or gallops.  Abdomen:  Nontender without masses or hepatosplenomegaly.  Bowel sounds are present.  No CVA or flank tenderness.  Extremities:  No edema.  No calf tenderness Skin:  No rash present.    ED Course  Procedures  None   Labs Reviewed  URINE CULTURE pending  POCT URINALYSIS DIP (MANUAL ENTRY) BLO trace lysed; LEU moderate    1. Dysuria   2. Acute cystitis     MDM  Urine culture  pending. Begin Keflex (patient prefers liquid medication) Increase fluid intake. Followup with Family Doctor if not improved in about 5 days.  Lattie Haw, MD 04/07/13 [phone]

## 2013-04-09 ENCOUNTER — Telehealth: Payer: Self-pay | Admitting: Emergency Medicine

## 2013-04-09 LAB — URINE CULTURE

## 2013-05-13 ENCOUNTER — Telehealth: Payer: Self-pay | Admitting: *Deleted

## 2013-05-13 DIAGNOSIS — I10 Essential (primary) hypertension: Secondary | ICD-10-CM

## 2013-05-13 MED ORDER — LISINOPRIL-HYDROCHLOROTHIAZIDE 20-25 MG PO TABS: 1.0000 | ORAL_TABLET | Freq: Every day | ORAL | Status: DC

## 2013-05-13 NOTE — Telephone Encounter (Signed)
Pt calls and needs a 90 day supply of BP med insurance will no longer cover a 30 day supply as it is a maintenance drug.  Rx sent . Barry Dienes, LPN

## 2013-07-09 ENCOUNTER — Ambulatory Visit (INDEPENDENT_AMBULATORY_CARE_PROVIDER_SITE_OTHER): Payer: BC Managed Care – PPO | Admitting: Sports Medicine

## 2013-07-09 ENCOUNTER — Encounter: Payer: Self-pay | Admitting: Sports Medicine

## 2013-07-09 VITALS — BP 111/73 | HR 86 | Wt 234.0 lb

## 2013-07-09 DIAGNOSIS — R109 Unspecified abdominal pain: Secondary | ICD-10-CM

## 2013-07-09 DIAGNOSIS — L821 Other seborrheic keratosis: Secondary | ICD-10-CM

## 2013-07-09 DIAGNOSIS — N12 Tubulo-interstitial nephritis, not specified as acute or chronic: Secondary | ICD-10-CM | POA: Insufficient documentation

## 2013-07-09 DIAGNOSIS — L918 Other hypertrophic disorders of the skin: Secondary | ICD-10-CM | POA: Insufficient documentation

## 2013-07-09 DIAGNOSIS — N3090 Cystitis, unspecified without hematuria: Secondary | ICD-10-CM

## 2013-07-09 LAB — POCT URINALYSIS DIPSTICK
Bilirubin, UA: NEGATIVE
Glucose, UA: NEGATIVE
Nitrite, UA: NEGATIVE
Protein, UA: NEGATIVE
Spec Grav, UA: >=1.03
Urobilinogen, UA: 0.2
pH, UA: 6

## 2013-07-09 MED ORDER — CEPHALEXIN 500 MG PO CAPS: 500.0000 mg | ORAL_CAPSULE | Freq: Two times a day (BID) | ORAL | Status: DC

## 2013-07-09 MED ORDER — PHENAZOPYRIDINE HCL 200 MG PO TABS: 200.0000 mg | ORAL_TABLET | Freq: Three times a day (TID) | ORAL | Status: AC

## 2013-07-09 NOTE — Progress Notes (Signed)
  Subjective:    CC: Abdominal pain  HPI: This is a pleasant 47 year old female with a several day history of suprapubic pain, dysuria, frequency, urgency, with mild back pain, no constitutional symptoms, this feels like prior UTIs. Symptoms are mild, persistent. The radiation. No GI symptoms.  Skin tags: Would like these removed.  Skin lesion: Left abdomen, dark, wonders what can be done.  Past medical history, Surgical history, Family history not pertinant except as noted below, Social history, Allergies, and medications have been entered into the medical record, reviewed, and no changes needed.   Review of Systems: No fevers, chills, night sweats, weight loss, chest pain, or shortness of breath.   Objective:    General: Well Developed, well nourished, and in no acute distress.  Neuro: Alert and oriented x3, extra-ocular muscles intact, sensation grossly intact.  HEENT: Normocephalic, atraumatic, pupils equal round reactive to light, neck supple, no masses, no lymphadenopathy, thyroid nonpalpable.  Skin: Warm and dry, no rashes. There are multiple skin tags over her back, she also has a 1.5 cm seborrheic keratosis on the left abdomen. Cardiac: Regular rate and rhythm, no murmurs rubs or gallops, no lower extremity edema.  Respiratory: Clear to auscultation bilaterally. Not using accessory muscles, speaking in full sentences. Abdomen: Soft, tender to palpation in the suprapubic region, no costovertebral angle tenderness, no guarding, no rebound tenderness.  Urinalysis was reviewed and shows leukocytes. Sent for culture.  Procedure:  Cryodestruction of left abdominal 1.5 cm seborrheic keratosis. Consent obtained and verified. Time-out conducted. Noted no overlying erythema, induration, or other signs of local infection. Completed without difficulty using Cryo-Gun. Advised to call if fevers/chills, erythema, induration, drainage, or persistent bleeding.  Impression and  Recommendations:

## 2013-07-09 NOTE — Assessment & Plan Note (Signed)
Cryotherapy performed. She does understand that it may take several treatments to get these completely resolved. 

## 2013-07-09 NOTE — Assessment & Plan Note (Addendum)
Urine culture. Keflex, Pyridium. Return as needed for this. 

## 2013-07-09 NOTE — Assessment & Plan Note (Signed)
We will remove these at a future date.

## 2013-07-11 LAB — URINE CULTURE
Colony Count: NO GROWTH
Organism ID, Bacteria: NO GROWTH

## 2013-07-16 ENCOUNTER — Encounter: Payer: Self-pay | Admitting: Sports Medicine

## 2013-07-16 ENCOUNTER — Ambulatory Visit (INDEPENDENT_AMBULATORY_CARE_PROVIDER_SITE_OTHER): Payer: BC Managed Care – PPO | Admitting: Sports Medicine

## 2013-07-16 VITALS — BP 130/85 | HR 90 | Wt 238.0 lb

## 2013-07-16 DIAGNOSIS — L821 Other seborrheic keratosis: Secondary | ICD-10-CM

## 2013-07-16 DIAGNOSIS — L909 Atrophic disorder of skin, unspecified: Secondary | ICD-10-CM

## 2013-07-16 DIAGNOSIS — L918 Other hypertrophic disorders of the skin: Secondary | ICD-10-CM

## 2013-07-16 NOTE — Assessment & Plan Note (Signed)
Skin tag removal performed today.

## 2013-07-16 NOTE — Progress Notes (Signed)
   Procedure: Removal of 4 skin tags.  Risks, benefits, and alternatives explained and consent obtained. Time out conducted. Surface prepped in a sterile fashion. 1cc lidocaine with epinephine infiltrated under skin tag Adequate anesthesia ensured. Skin tag excised from skin surface. Dressing applied. Pt stable. Pt advised to call or RTC for continued bleeding, spreading erythema/induration, fevers, or chills.  Procedure:  Cryodestruction of left abdominal seborrheic keratosis.  Consent obtained and verified. Time-out conducted. Noted no overlying erythema, induration, or other signs of local infection. Completed without difficulty using Cryo-Gun. Advised to call if fevers/chills, erythema, induration, drainage, or persistent bleeding.

## 2013-07-16 NOTE — Patient Instructions (Signed)
Keep area dry for 48 hours, change dressings/Band-Aid twice per day. Return as needed.

## 2013-07-16 NOTE — Assessment & Plan Note (Signed)
Repeat cryotherapy performed today.

## 2013-08-19 ENCOUNTER — Ambulatory Visit (INDEPENDENT_AMBULATORY_CARE_PROVIDER_SITE_OTHER): Payer: BC Managed Care – PPO | Admitting: Sports Medicine

## 2013-08-19 ENCOUNTER — Encounter: Payer: Self-pay | Admitting: Sports Medicine

## 2013-08-19 VITALS — BP 129/82 | HR 82 | Wt 237.0 lb

## 2013-08-19 DIAGNOSIS — R109 Unspecified abdominal pain: Secondary | ICD-10-CM

## 2013-08-19 DIAGNOSIS — N12 Tubulo-interstitial nephritis, not specified as acute or chronic: Secondary | ICD-10-CM

## 2013-08-19 LAB — POCT URINALYSIS DIPSTICK
Bilirubin, UA: NEGATIVE
Blood, UA: NEGATIVE
Glucose, UA: NEGATIVE
Nitrite, UA: NEGATIVE
Protein, UA: NEGATIVE
Spec Grav, UA: 1.025
Urobilinogen, UA: 0.2
pH, UA: 7

## 2013-08-19 MED ORDER — CIPROFLOXACIN HCL 750 MG PO TABS: 750.0000 mg | ORAL_TABLET | Freq: Two times a day (BID) | ORAL | Status: DC

## 2013-08-19 NOTE — Assessment & Plan Note (Signed)
Urine culture. Cipro twice a day for 14 days. She has 4 urinary tract infections last year and 3 this year, I am going to obtain a renal ultrasound. Return if no better in 2 weeks.

## 2013-08-19 NOTE — Progress Notes (Signed)
Patient ID: Wanda Valencia, female   DOB: [birth year], 47 y.o.   MRN: [id number]  Subjective:    CC: Possible UTI  HPI: This is a 47 year old female who was seen about a month ago for uncomplicated UTI. She was started on keflex  with resolution symptoms. About 2 weeks ago, she started complaining of pain in the lower abdomen and back. She also complains of nausea without vomiting. No dysuria, urinary urgency or frequency.  She denies any fever or chills. She came in today because of worsening of symptoms. Symptoms are moderate.  Past medical history, Surgical history, Family history not pertinant except as noted below, Social history, Allergies, and medications have been entered into the medical record, reviewed, and no changes needed.   Review of Systems: No fevers, chills, night sweats, weight loss, chest pain, or shortness of breath.   Objective:    General: Well Developed, well nourished, and in no acute distress.  Neuro: Alert and oriented x3, extra-ocular muscles intact, sensation grossly intact.  HEENT: Normocephalic, atraumatic, pupils equal round reactive to light, neck supple, no masses, no lymphadenopathy, thyroid nonpalpable.  Skin: Warm and dry, no rashes. Cardiac: Regular rate and rhythm, no murmurs rubs or gallops, no lower extremity edema.  Respiratory: Clear to auscultation bilaterally. Not using accessory muscles, speaking in full sentences. Urinary: Positive  Bilateral CVA tenderness. Abdomen is otherwise soft, no rigidity, no rebound tenderness, no palpable masses.  Lab: Positive leukocytes on urinalysis   Impression and Recommendations:   Assessment: Given the CVA tenderness and positive leukocytes, there is a high suspicion for uncomplicated pyelonephritis. We will treat with aciprofloxacin  750 mg PO two times daily for 14 days Patient advised to return if symptoms do not improve.

## 2013-08-19 NOTE — Patient Instructions (Signed)

## 2013-08-21 ENCOUNTER — Telehealth: Payer: Self-pay | Admitting: *Deleted

## 2013-08-21 ENCOUNTER — Ambulatory Visit (INDEPENDENT_AMBULATORY_CARE_PROVIDER_SITE_OTHER): Payer: BC Managed Care – PPO

## 2013-08-21 DIAGNOSIS — N12 Tubulo-interstitial nephritis, not specified as acute or chronic: Secondary | ICD-10-CM

## 2013-08-21 DIAGNOSIS — Q619 Cystic kidney disease, unspecified: Secondary | ICD-10-CM

## 2013-08-21 LAB — URINE CULTURE: Colony Count: 50000

## 2013-08-21 MED ORDER — SULFAMETHOXAZOLE-TRIMETHOPRIM 200-40 MG/5ML PO SUSP: 20.0000 mL | Freq: Two times a day (BID) | ORAL | Status: DC

## 2013-08-21 NOTE — Telephone Encounter (Signed)
Pt informed.  Wanda Cortner, LPN  

## 2013-08-21 NOTE — Telephone Encounter (Signed)
Misty, I've sent a liquid formulation of bactrim to her [facility] on [address], this will take the place of cipro.

## 2013-08-21 NOTE — Telephone Encounter (Signed)
Dr. Benjamin Stain sent pt Cipro to pharmacy and pt has fear of choking on pills and states that the Cipro has made her sick on her stomach and would like to know if we can send something different to pharmacy that won't make her so sick and that she can either chew or open up and take.  Meyer Cory, LPN

## 2013-11-05 ENCOUNTER — Encounter: Payer: Self-pay | Admitting: Sports Medicine

## 2013-11-05 ENCOUNTER — Ambulatory Visit (INDEPENDENT_AMBULATORY_CARE_PROVIDER_SITE_OTHER): Payer: BC Managed Care – PPO | Admitting: Sports Medicine

## 2013-11-05 ENCOUNTER — Telehealth: Payer: Self-pay

## 2013-11-05 VITALS — BP 121/73 | HR 88 | Ht 64.0 in | Wt 239.0 lb

## 2013-11-05 DIAGNOSIS — Z20828 Contact with and (suspected) exposure to other viral communicable diseases: Secondary | ICD-10-CM

## 2013-11-05 DIAGNOSIS — M25549 Pain in joints of unspecified hand: Secondary | ICD-10-CM

## 2013-11-05 MED ORDER — OSELTAMIVIR PHOSPHATE 75 MG PO CAPS: 75.0000 mg | ORAL_CAPSULE | Freq: Two times a day (BID) | ORAL | Status: DC

## 2013-11-05 MED ORDER — OSELTAMIVIR PHOSPHATE 12 MG/ML PO SUSR: 75.0000 mg | Freq: Two times a day (BID) | ORAL | Status: DC

## 2013-11-05 NOTE — Progress Notes (Signed)
  Subjective:    CC: Followup  HPI: Metacarpophalangeal joint arthritis: Last injection lasted for 8 months, desires repeat, recurrence of pain and swelling of the right first MCP. Pain is localized, moderate, persistent without radiation, worse with use of the hand, she has difficulty gripping cups now.  Exposure to influenza: Daughter was diagnosed with influenza A and is currently on Tamiflu, she is now starting to have a sore throat. Wonders if she needs to be treated.  Past medical history, Surgical history, Family history not pertinant except as noted below, Social history, Allergies, and medications have been entered into the medical record, reviewed, and no changes needed.   Review of Systems: No fevers, chills, night sweats, weight loss, chest pain, or shortness of breath.   Objective:    General: Well Developed, well nourished, and in no acute distress.  Neuro: Alert and oriented x3, extra-ocular muscles intact, sensation grossly intact.  HEENT: Normocephalic, atraumatic, pupils equal round reactive to light, neck supple, no masses, no lymphadenopathy, thyroid nonpalpable.  Skin: Warm and dry, no rashes. Cardiac: Regular rate and rhythm, no murmurs rubs or gallops, no lower extremity edema.  Respiratory: Clear to auscultation bilaterally. Not using accessory muscles, speaking in full sentences.  Procedure: Real-time Ultrasound Guided Injection of right first metacarpophalangeal joint Device: GE Logiq E  Verbal informed consent obtained.  Time-out conducted.  Noted no overlying erythema, induration, or other signs of local infection.  Skin prepped in a sterile fashion.  Local anesthesia: Topical Ethyl chloride.  With sterile technique and under real time ultrasound guidance:  0.5 cc Kenalog 40, 0.5 cc lidocaine injected easily into the joint. Completed without difficulty  Pain immediately resolved suggesting accurate placement of the medication.  Advised to call if  fevers/chills, erythema, induration, drainage, or persistent bleeding.  Images permanently stored and available for review in the ultrasound unit.  Impression: Technically successful ultrasound guided injection.  Impression and Recommendations:

## 2013-11-05 NOTE — Telephone Encounter (Signed)
Patient called stated that the Tamiflu liquid was too expensive she would like a Rx for the tablets in the Tamiflu. Rhonda Cunningham,CMA

## 2013-11-05 NOTE — Telephone Encounter (Signed)
Called in.

## 2013-11-05 NOTE — Assessment & Plan Note (Signed)
Previous injection lasted for 8 months. Repeat first metacarpophalangeal joint injection today. Return as needed.

## 2013-11-05 NOTE — Assessment & Plan Note (Signed)
Daughter was diagnosed with influenza, currently taking Tamiflu. Now the patient is developing some mild symptoms. She does desire a liquid, I am going to add Tamiflu liquid. If the liquid is too expensive I am happy to switch this to a pill.

## 2013-11-14 ENCOUNTER — Other Ambulatory Visit: Payer: Self-pay | Admitting: Sports Medicine

## 2014-01-27 ENCOUNTER — Ambulatory Visit (INDEPENDENT_AMBULATORY_CARE_PROVIDER_SITE_OTHER): Payer: BC Managed Care – PPO | Admitting: Sports Medicine

## 2014-01-27 ENCOUNTER — Encounter: Payer: Self-pay | Admitting: Sports Medicine

## 2014-01-27 VITALS — BP 126/79 | HR 91 | Ht 64.0 in | Wt 241.0 lb

## 2014-01-27 DIAGNOSIS — M25549 Pain in joints of unspecified hand: Secondary | ICD-10-CM

## 2014-01-27 MED ORDER — NAPROXEN 500 MG PO TABS: 500.0000 mg | ORAL_TABLET | Freq: Two times a day (BID) | ORAL | Status: DC

## 2014-01-27 NOTE — Assessment & Plan Note (Signed)
Repeat first metacarpal phalangeal joint injection as above. Return as needed. We are switching to naproxen from Mobic.

## 2014-01-27 NOTE — Progress Notes (Signed)
  Subjective:    CC: Recheck thumb pain  HPI: Right metacarpophalangeal joint osteoarthritis: Last injection was about 4 months ago, the previous injection was 8 months prior, desires a repeat, pain is localized at the right first metacarpophalangeal joint without radiation, stiff in the mornings. Moderate, persistent.  Past medical history, Surgical history, Family history not pertinant except as noted below, Social history, Allergies, and medications have been entered into the medical record, reviewed, and no changes needed.   Review of Systems: No fevers, chills, night sweats, weight loss, chest pain, or shortness of breath.   Objective:    General: Well Developed, well nourished, and in no acute distress.  Neuro: Alert and oriented x3, extra-ocular muscles intact, sensation grossly intact.  HEENT: Normocephalic, atraumatic, pupils equal round reactive to light, neck supple, no masses, no lymphadenopathy, thyroid nonpalpable.  Skin: Warm and dry, no rashes. Cardiac: Regular rate and rhythm, no murmurs rubs or gallops, no lower extremity edema.  Respiratory: Clear to auscultation bilaterally. Not using accessory muscles, speaking in full sentences.  Procedure: Real-time Ultrasound Guided Injection of right first MCP Device: GE Logiq E  Verbal informed consent obtained.  Time-out conducted.  Noted no overlying erythema, induration, or other signs of local infection.  Skin prepped in a sterile fashion.  Local anesthesia: Topical Ethyl chloride.  With sterile technique and under real time ultrasound guidance:  25-gauge needle advanced into the MCP, a total 0.5 cc Kenalog 40, 0.5 cc lidocaine injected slowly. Completed without difficulty  Pain immediately resolved suggesting accurate placement of the medication.  Advised to call if fevers/chills, erythema, induration, drainage, or persistent bleeding.  Images permanently stored and available for review in the ultrasound unit.    Impression: Technically successful ultrasound guided injection.  Impression and Recommendations:

## 2014-04-01 ENCOUNTER — Other Ambulatory Visit: Payer: Self-pay | Admitting: Sports Medicine

## 2014-04-21 ENCOUNTER — Ambulatory Visit (INDEPENDENT_AMBULATORY_CARE_PROVIDER_SITE_OTHER): Payer: BC Managed Care – PPO | Admitting: Physician Assistant

## 2014-04-21 ENCOUNTER — Encounter: Payer: Self-pay | Admitting: Physician Assistant

## 2014-04-21 VITALS — BP 126/75 | HR 73 | Ht 64.0 in | Wt 240.0 lb

## 2014-04-21 DIAGNOSIS — K6289 Other specified diseases of anus and rectum: Secondary | ICD-10-CM

## 2014-04-21 DIAGNOSIS — K625 Hemorrhage of anus and rectum: Secondary | ICD-10-CM

## 2014-04-21 DIAGNOSIS — K5900 Constipation, unspecified: Secondary | ICD-10-CM

## 2014-04-21 MED ORDER — HYDROCORTISONE 2.5 % RE CREA: 1.0000 "application " | TOPICAL_CREAM | Freq: Two times a day (BID) | RECTAL | Status: DC

## 2014-04-21 NOTE — Patient Instructions (Addendum)
Miralax 1 capful daily as needed  anusul twice daily as needed.   Hemorrhoids Hemorrhoids are swollen veins around the rectum or anus. There are two types of hemorrhoids:   Internal hemorrhoids. These occur in the veins just inside the rectum. They may poke through to the outside and become irritated and painful.  External hemorrhoids. These occur in the veins outside the anus and can be felt as a painful swelling or hard lump near the anus. CAUSES  Pregnancy.   Obesity.   Constipation or diarrhea.   Straining to have a bowel movement.   Sitting for long periods on the toilet.  Heavy lifting or other activity that caused you to strain.  Anal intercourse. SYMPTOMS   Pain.   Anal itching or irritation.   Rectal bleeding.   Fecal leakage.   Anal swelling.   One or more lumps around the anus.  DIAGNOSIS  Your caregiver may be able to diagnose hemorrhoids by visual examination. Other examinations or tests that may be performed include:   Examination of the rectal area with a gloved hand (digital rectal exam).   Examination of anal canal using a small tube (scope).   A blood test if you have lost a significant amount of blood.  A test to look inside the colon (sigmoidoscopy or colonoscopy). TREATMENT Most hemorrhoids can be treated at home. However, if symptoms do not seem to be getting better or if you have a lot of rectal bleeding, your caregiver may perform a procedure to help make the hemorrhoids get smaller or remove them completely. Possible treatments include:   Placing a rubber band at the base of the hemorrhoid to cut off the circulation (rubber band ligation).   Injecting a chemical to shrink the hemorrhoid (sclerotherapy).   Using a tool to burn the hemorrhoid (infrared light therapy).   Surgically removing the hemorrhoid (hemorrhoidectomy).   Stapling the hemorrhoid to block blood flow to the tissue (hemorrhoid stapling).  HOME CARE  INSTRUCTIONS   Eat foods with fiber, such as whole grains, beans, nuts, fruits, and vegetables. Ask your doctor about taking products with added fiber in them (fibersupplements).  Increase fluid intake. Drink enough water and fluids to keep your urine clear or pale yellow.   Exercise regularly.   Go to the bathroom when you have the urge to have a bowel movement. Do not wait.   Avoid straining to have bowel movements.   Keep the anal area dry and clean. Use wet toilet paper or moist towelettes after a bowel movement.   Medicated creams and suppositories may be used or applied as directed.   Only take over-the-counter or prescription medicines as directed by your caregiver.   Take warm sitz baths for 15-20 minutes, 3-4 times a day to ease pain and discomfort.   Place ice packs on the hemorrhoids if they are tender and swollen. Using ice packs between sitz baths may be helpful.   Put ice in a plastic bag.   Place a towel between your skin and the bag.   Leave the ice on for 15-20 minutes, 3-4 times a day.   Do not use a donut-shaped pillow or sit on the toilet for long periods. This increases blood pooling and pain.  SEEK MEDICAL CARE IF:  You have increasing pain and swelling that is not controlled by treatment or medicine.  You have uncontrolled bleeding.  You have difficulty or you are unable to have a bowel movement.  You have pain or  inflammation outside the area of the hemorrhoids. MAKE SURE YOU:  Understand these instructions.  Will watch your condition.  Will get help right away if you are not doing well or get worse. Document Released: 09/09/2000 Document Revised: 08/29/2012 Document Reviewed: 07/17/2012 [facility] Patient Information 2015 [place]. This information is not intended to replace advice given to you by your health care provider. Make sure you discuss any questions you have with your health care provider.   High-Fiber Diet Fiber  is found in fruits, vegetables, and grains. A high-fiber diet encourages the addition of more whole grains, legumes, fruits, and vegetables in your diet. The recommended amount of fiber for adult males is 38 g per day. For adult females, it is 25 g per day. Pregnant and lactating women should get 28 g of fiber per day. If you have a digestive or bowel problem, ask your caregiver for advice before adding high-fiber foods to your diet. Eat a variety of high-fiber foods instead of only a select few type of foods.  PURPOSE  To increase stool bulk.  To make bowel movements more regular to prevent constipation.  To lower cholesterol.  To prevent overeating. WHEN IS THIS DIET USED?  It may be used if you have constipation and hemorrhoids.  It may be used if you have uncomplicated diverticulosis (intestine condition) and irritable bowel syndrome.  It may be used if you need help with weight management.  It may be used if you want to add it to your diet as a protective measure against atherosclerosis, diabetes, and cancer. SOURCES OF FIBER  Whole-grain breads and cereals.  Fruits, such as apples, oranges, bananas, berries, prunes, and pears.  Vegetables, such as green peas, carrots, sweet potatoes, beets, broccoli, cabbage, spinach, and artichokes.  Legumes, such split peas, soy, lentils.  Almonds. FIBER CONTENT IN FOODS Starches and Grains / Dietary Fiber (g)  Cheerios, 1 cup / 3 g  Corn Flakes cereal, 1 cup / 0.7 g  Rice crispy treat cereal, 1 cup / 0.3 g  Instant oatmeal (cooked),  cup / 2 g  Frosted wheat cereal, 1 cup / 5.1 g  Brown, long-grain rice (cooked), 1 cup / 3.5 g  White, long-grain rice (cooked), 1 cup / 0.6 g  Enriched macaroni (cooked), 1 cup / 2.5 g Legumes / Dietary Fiber (g)  Baked beans (canned, plain, or vegetarian),  cup / 5.2 g  Kidney beans (canned),  cup / 6.8 g  Pinto beans (cooked),  cup / 5.5 g Breads and Crackers / Dietary Fiber  (g)  Plain or honey graham crackers, 2 squares / 0.7 g  Saltine crackers, 3 squares / 0.3 g  Plain, salted pretzels, 10 pieces / 1.8 g  Whole-wheat bread, 1 slice / 1.9 g  White bread, 1 slice / 0.7 g  Raisin bread, 1 slice / 1.2 g  Plain bagel, 3 oz / 2 g  Flour tortilla, 1 oz / 0.9 g  Corn tortilla, 1 small / 1.5 g  Hamburger or hotdog bun, 1 small / 0.9 g Fruits / Dietary Fiber (g)  Apple with skin, 1 medium / 4.4 g  Sweetened applesauce,  cup / 1.5 g  Banana,  medium / 1.5 g  Grapes, 10 grapes / 0.4 g  Orange, 1 small / 2.3 g  Raisin, 1.5 oz / 1.6 g  Melon, 1 cup / 1.4 g Vegetables / Dietary Fiber (g)  Green beans (canned),  cup / 1.3 g  Carrots (cooked),  cup / 2.3 g  Broccoli (cooked),  cup / 2.8 g  Peas (cooked),  cup / 4.4 g  Mashed potatoes,  cup / 1.6 g  Lettuce, 1 cup / 0.5 g  Corn (canned),  cup / 1.6 g  Tomato,  cup / 1.1 g Document Released: 09/12/2005 Document Revised: 03/13/2012 Document Reviewed: 12/15/2011 [facility] Patient Information 2015 [place], [place]. This information is not intended to replace advice given to you by your health care provider. Make sure you discuss any questions you have with your health care provider.

## 2014-04-21 NOTE — Progress Notes (Signed)
   Subjective:    Patient ID: Wanda Valencia, female    DOB: 1965/12/22, 48 y.o.   MRN: [id number]  HPI Pt presents to the clinic with rectal pain that started 4 weeks ago after she had a hard stool with straining. She bled with bowel movement and then for a few days after. She had rectal discomfort for 10 days but seemed to be getting better. She then had another hard stool with pain and felt like it has gotten worse. rectacare is helping. She did start metamucil about 2 weeks ago and not had another hard painful stool. No blood since last episode. She goes every morning but stool consistency is often harder. No abdominal pain. No fever, chills. No hx of hemorrhoids.     Review of Systems  All other systems reviewed and are negative.      Objective:   Physical Exam  Constitutional: She is oriented to person, place, and time. She appears well-developed and well-nourished.  HENT:  Head: Normocephalic and atraumatic.  Cardiovascular: Normal rate, regular rhythm and normal heart sounds.   Pulmonary/Chest: Effort normal and breath sounds normal.  Genitourinary: Guaiac negative stool.     Neurological: She is alert and oriented to person, place, and time.  Psychiatric: She has a normal mood and affect. Her behavior is normal.          Assessment & Plan:  Rectal pain/bleeding/constipation- anal fissure vs hemorrhoid. Hemoccult negative today. She certainly does have one hemorrhoid that seems to be resolving  but could be a fissure as well. Treat conservately at first with anusol bid as needed. Encouraged warm sitz baths and working on soft stools. Encouraged miralax 1 capful daily, high fiber diet and increase in water. HO given. Follow up if worsening or not improving.

## 2014-05-02 ENCOUNTER — Ambulatory Visit (INDEPENDENT_AMBULATORY_CARE_PROVIDER_SITE_OTHER): Payer: BC Managed Care – PPO | Admitting: Sports Medicine

## 2014-05-02 ENCOUNTER — Encounter: Payer: Self-pay | Admitting: Sports Medicine

## 2014-05-02 VITALS — BP 130/79 | HR 89 | Wt 242.0 lb

## 2014-05-02 DIAGNOSIS — M2240 Chondromalacia patellae, unspecified knee: Secondary | ICD-10-CM

## 2014-05-02 DIAGNOSIS — IMO0002 Reserved for concepts with insufficient information to code with codable children: Secondary | ICD-10-CM

## 2014-05-02 DIAGNOSIS — M1710 Unilateral primary osteoarthritis, unspecified knee: Secondary | ICD-10-CM

## 2014-05-02 NOTE — Progress Notes (Signed)
  Subjective:    CC: Knee pain  HPI: Knee osteoarthritis: Bilateral, starting to have a recurrence of pain after injection one year in 4 months ago, the pain is moderate, persistent, localized to the medial joint lines bilaterally with gelling and no mechanical symptoms. No radiation.  Past medical history, Surgical history, Family history not pertinant except as noted below, Social history, Allergies, and medications have been entered into the medical record, reviewed, and no changes needed.   Review of Systems: No fevers, chills, night sweats, weight loss, chest pain, or shortness of breath.   Objective:    General: Well Developed, well nourished, and in no acute distress.  Neuro: Alert and oriented x3, extra-ocular muscles intact, sensation grossly intact.  HEENT: Normocephalic, atraumatic, pupils equal round reactive to light, neck supple, no masses, no lymphadenopathy, thyroid nonpalpable.  Skin: Warm and dry, no rashes. Cardiac: Regular rate and rhythm, no murmurs rubs or gallops, no lower extremity edema.  Respiratory: Clear to auscultation bilaterally. Not using accessory muscles, speaking in full sentences. Bilateral Knee: Normal to inspection with no erythema or effusion or obvious bony abnormalities. Tender to palpation at the joint lines bilaterally. ROM normal in flexion and extension and lower leg rotation. Ligaments with solid consistent endpoints including ACL, PCL, LCL, MCL. Negative Mcmurray's and provocative meniscal tests. Non painful patellar compression. Patellar and quadriceps tendons unremarkable. Hamstring and quadriceps strength is normal.  Procedure: Real-time Ultrasound Guided Injection of left knee Device: GE Logiq E  Verbal informed consent obtained.  Time-out conducted.  Noted no overlying erythema, induration, or other signs of local infection.  Skin prepped in a sterile fashion.  Local anesthesia: Topical Ethyl chloride.  With sterile technique and  under real time ultrasound guidance:  2 cc Kenalog 40, 4 cc lidocaine injected easily into the suprapatellar recess, syringe switched and 30 mg/2 mL of OrthoVisc (sodium hyaluronate) in a prefilled syringe was injected easily into the knee through a 22-gauge needle. Completed without difficulty  Pain immediately resolved suggesting accurate placement of the medication.  Advised to call if fevers/chills, erythema, induration, drainage, or persistent bleeding.  Images permanently stored and available for review in the ultrasound unit.  Impression: Technically successful ultrasound guided injection.  Procedure: Real-time Ultrasound Guided Injection of right knee Device: GE Logiq E  Verbal informed consent obtained.  Time-out conducted.  Noted no overlying erythema, induration, or other signs of local infection.  Skin prepped in a sterile fashion.  Local anesthesia: Topical Ethyl chloride.  With sterile technique and under real time ultrasound guidance:  2 cc Kenalog 40, 4 cc lidocaine injected easily into the suprapatellar recess, syringe switched and 30 mg/2 mL of OrthoVisc (sodium hyaluronate) in a prefilled syringe was injected easily into the knee through a 22-gauge needle. Completed without difficulty  Pain immediately resolved suggesting accurate placement of the medication.  Advised to call if fevers/chills, erythema, induration, drainage, or persistent bleeding.  Images permanently stored and available for review in the ultrasound unit.  Impression: Technically successful ultrasound guided injection.  Impression and Recommendations:

## 2014-05-02 NOTE — Assessment & Plan Note (Signed)
Bilateral injection and OrthoVisc. Return in one week for OrthoVisc injection #2 of 4 into both knees.

## 2014-05-08 ENCOUNTER — Encounter: Payer: Self-pay | Admitting: Sports Medicine

## 2014-05-08 ENCOUNTER — Ambulatory Visit (INDEPENDENT_AMBULATORY_CARE_PROVIDER_SITE_OTHER): Payer: BC Managed Care – PPO | Admitting: Sports Medicine

## 2014-05-08 VITALS — BP 132/80 | HR 64 | Ht 64.0 in | Wt 234.0 lb

## 2014-05-08 DIAGNOSIS — M2240 Chondromalacia patellae, unspecified knee: Secondary | ICD-10-CM

## 2014-05-08 NOTE — Progress Notes (Signed)
    Procedure: Real-time Ultrasound Guided Injection of left knee Device: GE Logiq E  Verbal informed consent obtained.  Time-out conducted.  Noted no overlying erythema, induration, or other signs of local infection.  Skin prepped in a sterile fashion.  Local anesthesia: Topical Ethyl chloride.  With sterile technique and under real time ultrasound guidance:  30 mg/2 mL of OrthoVisc (sodium hyaluronate) in a prefilled syringe was injected easily into the knee through a 22-gauge needle. Completed without difficulty  Pain immediately resolved suggesting accurate placement of the medication.  Advised to call if fevers/chills, erythema, induration, drainage, or persistent bleeding.  Images permanently stored and available for review in the ultrasound unit.  Impression: Technically successful ultrasound guided injection.  Procedure: Real-time Ultrasound Guided Injection of right knee Device: GE Logiq E  Verbal informed consent obtained.  Time-out conducted.  Noted no overlying erythema, induration, or other signs of local infection.  Skin prepped in a sterile fashion.  Local anesthesia: Topical Ethyl chloride.  With sterile technique and under real time ultrasound guidance: 30 mg/2 mL of OrthoVisc (sodium hyaluronate) in a prefilled syringe was injected easily into the knee through a 22-gauge needle. Completed without difficulty  Pain immediately resolved suggesting accurate placement of the medication.  Advised to call if fevers/chills, erythema, induration, drainage, or persistent bleeding.  Images permanently stored and available for review in the ultrasound unit.  Impression: Technically successful ultrasound guided injection.  Impression and Recommendations:

## 2014-05-08 NOTE — Assessment & Plan Note (Signed)
OrthoVisc injection #2 into both knees. Return in one week for #3, currently pain-free.

## 2014-05-10 ENCOUNTER — Other Ambulatory Visit: Payer: Self-pay | Admitting: Sports Medicine

## 2014-05-11 NOTE — Telephone Encounter (Signed)
Refilled lisinopril-hydrochlorothiazide for 6 more months. She hasn't been seen for a HTN visit for over a year but due to the fact she has a physical in October and has been seen multiple times for other problems and her blood pressure was stable I refilled medication.

## 2014-05-15 ENCOUNTER — Ambulatory Visit: Payer: BC Managed Care – PPO | Admitting: Sports Medicine

## 2014-05-16 ENCOUNTER — Encounter: Payer: Self-pay | Admitting: Sports Medicine

## 2014-05-16 ENCOUNTER — Ambulatory Visit (INDEPENDENT_AMBULATORY_CARE_PROVIDER_SITE_OTHER): Payer: BC Managed Care – PPO | Admitting: Sports Medicine

## 2014-05-16 VITALS — BP 116/67 | HR 79 | Ht 64.0 in | Wt 239.0 lb

## 2014-05-16 DIAGNOSIS — M2240 Chondromalacia patellae, unspecified knee: Secondary | ICD-10-CM

## 2014-05-16 NOTE — Progress Notes (Signed)
    Procedure: Real-time Ultrasound Guided Injection of left knee Device: GE Logiq E  Verbal informed consent obtained.  Time-out conducted.  Noted no overlying erythema, induration, or other signs of local infection.  Skin prepped in a sterile fashion.  Local anesthesia: Topical Ethyl chloride.  With sterile technique and under real time ultrasound guidance:  30 mg/2 mL of OrthoVisc (sodium hyaluronate) in a prefilled syringe was injected easily into the knee through a 22-gauge needle. Completed without difficulty  Pain immediately resolved suggesting accurate placement of the medication.  Advised to call if fevers/chills, erythema, induration, drainage, or persistent bleeding.  Images permanently stored and available for review in the ultrasound unit.  Impression: Technically successful ultrasound guided injection.  Procedure: Real-time Ultrasound Guided Injection of right knee Device: GE Logiq E  Verbal informed consent obtained.  Time-out conducted.  Noted no overlying erythema, induration, or other signs of local infection.  Skin prepped in a sterile fashion.  Local anesthesia: Topical Ethyl chloride.  With sterile technique and under real time ultrasound guidance: 30 mg/2 mL of OrthoVisc (sodium hyaluronate) in a prefilled syringe was injected easily into the knee through a 22-gauge needle. Completed without difficulty  Pain immediately resolved suggesting accurate placement of the medication.  Advised to call if fevers/chills, erythema, induration, drainage, or persistent bleeding.  Images permanently stored and available for review in the ultrasound unit.  Impression: Technically successful ultrasound guided injection.  Impression and Recommendations:     

## 2014-05-16 NOTE — Assessment & Plan Note (Signed)
OrthoVisc injection #3 into both knees. Return in one week for #4.

## 2014-05-20 ENCOUNTER — Encounter: Payer: Self-pay | Admitting: Physician Assistant

## 2014-05-20 ENCOUNTER — Ambulatory Visit (INDEPENDENT_AMBULATORY_CARE_PROVIDER_SITE_OTHER): Payer: BC Managed Care – PPO | Admitting: Physician Assistant

## 2014-05-20 VITALS — BP 108/67 | HR 75 | Temp 98.2°F | Ht 64.0 in | Wt 238.0 lb

## 2014-05-20 DIAGNOSIS — R8290 Unspecified abnormal findings in urine: Secondary | ICD-10-CM

## 2014-05-20 DIAGNOSIS — R109 Unspecified abdominal pain: Secondary | ICD-10-CM

## 2014-05-20 DIAGNOSIS — R1030 Lower abdominal pain, unspecified: Secondary | ICD-10-CM

## 2014-05-20 DIAGNOSIS — R82998 Other abnormal findings in urine: Secondary | ICD-10-CM

## 2014-05-20 DIAGNOSIS — Z87442 Personal history of urinary calculi: Secondary | ICD-10-CM

## 2014-05-20 DIAGNOSIS — R10A0 Flank pain, unspecified side: Secondary | ICD-10-CM

## 2014-05-20 LAB — POCT URINALYSIS DIPSTICK
Bilirubin, UA: NEGATIVE
Blood, UA: NEGATIVE
Glucose, UA: NEGATIVE
Ketones, UA: NEGATIVE
LEUKOCYTES UA: NEGATIVE
Nitrite, UA: NEGATIVE
PH UA: 6.5
PROTEIN UA: NEGATIVE
Spec Grav, UA: 1.02
Urobilinogen, UA: 0.2

## 2014-05-20 MED ORDER — TAMSULOSIN HCL 0.4 MG PO CAPS: 0.4000 mg | ORAL_CAPSULE | Freq: Every day | ORAL | Status: DC

## 2014-05-20 NOTE — Progress Notes (Signed)
   Subjective:    Patient ID: Wanda Valencia, female    DOB: [birth year], 48 y.o.   MRN: [id number]  HPI Pt is a 48 yo female with hx of kidney stones and recurrent UTI's that presents with 3 days of bilateral flank pain. This morning she also noticed an odor to her urine and some abdominal cramping. Pain is sporatic and rates 4/10. In October 2014 she had renal u/s that showed some stones in kidney. Denies any fever, chills, n/v/d. No pain with urination.    Review of Systems  All other systems reviewed and are negative.      Objective:   Physical Exam  Constitutional: She is oriented to person, place, and time. She appears well-developed and well-nourished.  HENT:  Head: Normocephalic and atraumatic.  Cardiovascular: Normal rate, regular rhythm and normal heart sounds.   Pulmonary/Chest: Effort normal and breath sounds normal.  No CVA tenderness.   Abdominal: Soft. Bowel sounds are normal.  Discomfort with lower right and left palpation but no tenderness, guarding, or rebound.   Neurological: She is alert and oriented to person, place, and time.  Skin: Skin is dry.  Psychiatric: She has a normal mood and affect. Her behavior is normal.          Assessment & Plan:  Flank pain/abdominal cramping/urine odor/hx of kidney stones-  Results for orders placed in visit on 05/20/14  POCT URINALYSIS DIPSTICK      Result Value Ref Range   Color, UA yellow     Clarity, UA clear     Glucose, UA neg     Bilirubin, UA neg     Ketones, UA neg     Spec Grav, UA 1.020     Blood, UA neg     pH, UA 6.5     Protein, UA neg     Urobilinogen, UA 0.2     Nitrite, UA neg     Leukocytes, UA Negative     Will culture could be early that not detecting in dipstick.  Gave flomax to help if is start of a stone to pass. Use tylenol for pain. No nausea. Discussed with pt with no blood makes kidney stone less likely.  Continue to drink water and stay hydrated.  Will get CBC and cmp today.  Red  flags discussed. Pt aware if any worsening symptoms to call office. At that time we could get images if pain increasing.

## 2014-05-21 LAB — CBC WITH DIFFERENTIAL/PLATELET
BASOS ABS: 0 10*3/uL (ref 0.0–0.1)
Basophils Relative: 0 % (ref 0–1)
EOS PCT: 5 % (ref 0–5)
Eosinophils Absolute: 0.3 10*3/uL (ref 0.0–0.7)
HCT: 40.6 % (ref 36.0–46.0)
Hemoglobin: 13.3 g/dL (ref 12.0–15.0)
Lymphocytes Relative: 26 % (ref 12–46)
Lymphs Abs: 1.5 10*3/uL (ref 0.7–4.0)
MCH: 30.2 pg (ref 26.0–34.0)
MCHC: 32.8 g/dL (ref 30.0–36.0)
MCV: 92.1 fL (ref 78.0–100.0)
Monocytes Absolute: 0.6 10*3/uL (ref 0.1–1.0)
Monocytes Relative: 11 % (ref 3–12)
Neutro Abs: 3.3 10*3/uL (ref 1.7–7.7)
Neutrophils Relative %: 58 % (ref 43–77)
Platelets: 276 10*3/uL (ref 150–400)
RBC: 4.41 MIL/uL (ref 3.87–5.11)
RDW: 13.5 % (ref 11.5–15.5)
WBC: 5.7 10*3/uL (ref 4.0–10.5)

## 2014-05-21 LAB — COMPLETE METABOLIC PANEL WITH GFR
ALT: 40 U/L — ABNORMAL HIGH (ref 0–35)
AST: 29 U/L (ref 0–37)
Albumin: 4.1 g/dL (ref 3.5–5.2)
Alkaline Phosphatase: 62 U/L (ref 39–117)
BUN: 22 mg/dL (ref 6–23)
CALCIUM: 9.5 mg/dL (ref 8.4–10.5)
CO2: 29 mEq/L (ref 19–32)
CREATININE: 0.92 mg/dL (ref 0.50–1.10)
Chloride: 103 mEq/L (ref 96–112)
GFR, EST NON AFRICAN AMERICAN: 73 mL/min
GFR, Est African American: 84 mL/min
Glucose, Bld: 92 mg/dL (ref 70–99)
Potassium: 4.4 mEq/L (ref 3.5–5.3)
Sodium: 139 mEq/L (ref 135–145)
Total Bilirubin: 0.5 mg/dL (ref 0.2–1.2)
Total Protein: 6.2 g/dL (ref 6.0–8.3)

## 2014-05-22 ENCOUNTER — Other Ambulatory Visit: Payer: Self-pay | Admitting: Physician Assistant

## 2014-05-22 MED ORDER — CIPROFLOXACIN HCL 500 MG PO TABS: 500.0000 mg | ORAL_TABLET | Freq: Two times a day (BID) | ORAL | Status: DC

## 2014-05-23 ENCOUNTER — Encounter: Payer: Self-pay | Admitting: Sports Medicine

## 2014-05-23 ENCOUNTER — Ambulatory Visit (INDEPENDENT_AMBULATORY_CARE_PROVIDER_SITE_OTHER): Payer: BC Managed Care – PPO | Admitting: Sports Medicine

## 2014-05-23 ENCOUNTER — Other Ambulatory Visit: Payer: Self-pay | Admitting: Physician Assistant

## 2014-05-23 VITALS — BP 116/71 | HR 97 | Ht 64.0 in | Wt 240.0 lb

## 2014-05-23 DIAGNOSIS — M2240 Chondromalacia patellae, unspecified knee: Secondary | ICD-10-CM

## 2014-05-23 LAB — URINE CULTURE: Colony Count: 75000

## 2014-05-23 MED ORDER — CIPROFLOXACIN 500 MG/5ML (10%) PO SUSR: 500.0000 mg | Freq: Two times a day (BID) | ORAL | Status: DC

## 2014-05-23 NOTE — Progress Notes (Signed)
    Procedure: Real-time Ultrasound Guided Injection of left knee Device: GE Logiq E  Verbal informed consent obtained.  Time-out conducted.  Noted no overlying erythema, induration, or other signs of local infection.  Skin prepped in a sterile fashion.  Local anesthesia: Topical Ethyl chloride.  With sterile technique and under real time ultrasound guidance:  30 mg/2 mL of OrthoVisc (sodium hyaluronate) in a prefilled syringe was injected easily into the knee through a 22-gauge needle. Completed without difficulty  Pain immediately resolved suggesting accurate placement of the medication.  Advised to call if fevers/chills, erythema, induration, drainage, or persistent bleeding.  Images permanently stored and available for review in the ultrasound unit.  Impression: Technically successful ultrasound guided injection.  Procedure: Real-time Ultrasound Guided Injection of right knee Device: GE Logiq E  Verbal informed consent obtained.  Time-out conducted.  Noted no overlying erythema, induration, or other signs of local infection.  Skin prepped in a sterile fashion.  Local anesthesia: Topical Ethyl chloride.  With sterile technique and under real time ultrasound guidance: 30 mg/2 mL of OrthoVisc (sodium hyaluronate) in a prefilled syringe was injected easily into the knee through a 22-gauge needle. Completed without difficulty  Pain immediately resolved suggesting accurate placement of the medication.  Advised to call if fevers/chills, erythema, induration, drainage, or persistent bleeding.  Images permanently stored and available for review in the ultrasound unit.  Impression: Technically successful ultrasound guided injection.  Impression and Recommendations:

## 2014-05-23 NOTE — Assessment & Plan Note (Signed)
OrthoVisc injection #4 of 4 into both knees. Pain-free. Return as needed.

## 2014-06-09 ENCOUNTER — Telehealth: Payer: Self-pay | Admitting: *Deleted

## 2014-06-09 DIAGNOSIS — R748 Abnormal levels of other serum enzymes: Secondary | ICD-10-CM

## 2014-06-09 NOTE — Telephone Encounter (Signed)
CMP ordered to recheck liver enzymes. 

## 2014-06-10 LAB — COMPLETE METABOLIC PANEL WITH GFR
ALK PHOS: 66 U/L (ref 39–117)
ALT: 43 U/L — AB (ref 0–35)
AST: 32 U/L (ref 0–37)
Albumin: 4.3 g/dL (ref 3.5–5.2)
BUN: 17 mg/dL (ref 6–23)
CO2: 28 mEq/L (ref 19–32)
Calcium: 9.9 mg/dL (ref 8.4–10.5)
Chloride: 105 mEq/L (ref 96–112)
Creat: 0.69 mg/dL (ref 0.50–1.10)
GFR, Est African American: >89 mL/min
GLUCOSE: 91 mg/dL (ref 70–99)
POTASSIUM: 4.6 meq/L (ref 3.5–5.3)
SODIUM: 143 meq/L (ref 135–145)
TOTAL PROTEIN: 6.5 g/dL (ref 6.0–8.3)
Total Bilirubin: 0.4 mg/dL (ref 0.2–1.2)

## 2014-06-13 ENCOUNTER — Other Ambulatory Visit: Payer: Self-pay | Admitting: Physician Assistant

## 2014-06-13 DIAGNOSIS — R748 Abnormal levels of other serum enzymes: Secondary | ICD-10-CM

## 2014-06-16 ENCOUNTER — Other Ambulatory Visit: Payer: Self-pay | Admitting: Physician Assistant

## 2014-06-17 ENCOUNTER — Ambulatory Visit (INDEPENDENT_AMBULATORY_CARE_PROVIDER_SITE_OTHER): Payer: BC Managed Care – PPO

## 2014-06-17 ENCOUNTER — Encounter: Payer: Self-pay | Admitting: Physician Assistant

## 2014-06-17 DIAGNOSIS — K760 Fatty (change of) liver, not elsewhere classified: Secondary | ICD-10-CM | POA: Insufficient documentation

## 2014-06-17 DIAGNOSIS — K8020 Calculus of gallbladder without cholecystitis without obstruction: Secondary | ICD-10-CM

## 2014-06-17 DIAGNOSIS — K8050 Calculus of bile duct without cholangitis or cholecystitis without obstruction: Secondary | ICD-10-CM | POA: Insufficient documentation

## 2014-06-17 LAB — IRON AND TIBC
%SAT: 25 % (ref 20–55)
Iron: 81 ug/dL (ref 42–145)
TIBC: 327 ug/dL (ref 250–470)
UIBC: 246 ug/dL (ref 125–400)

## 2014-06-17 LAB — HEPATITIS PANEL, ACUTE
HCV Ab: NEGATIVE
Hep A IgM: NONREACTIVE
Hep B C IgM: NONREACTIVE
Hepatitis B Surface Ag: NEGATIVE

## 2014-06-17 LAB — FERRITIN: FERRITIN: 30 ng/mL (ref 10–291)

## 2014-06-28 ENCOUNTER — Other Ambulatory Visit: Payer: Self-pay | Admitting: Physician Assistant

## 2014-07-14 ENCOUNTER — Ambulatory Visit (INDEPENDENT_AMBULATORY_CARE_PROVIDER_SITE_OTHER): Payer: BC Managed Care – PPO | Admitting: Sports Medicine

## 2014-07-14 ENCOUNTER — Encounter: Payer: Self-pay | Admitting: Sports Medicine

## 2014-07-14 VITALS — BP 136/93 | HR 97 | Ht 64.0 in | Wt 240.0 lb

## 2014-07-14 DIAGNOSIS — M2240 Chondromalacia patellae, unspecified knee: Secondary | ICD-10-CM

## 2014-07-14 DIAGNOSIS — I10 Essential (primary) hypertension: Secondary | ICD-10-CM | POA: Diagnosis not present

## 2014-07-14 DIAGNOSIS — Z23 Encounter for immunization: Secondary | ICD-10-CM | POA: Diagnosis not present

## 2014-07-14 DIAGNOSIS — Z0000 Encounter for general adult medical examination without abnormal findings: Secondary | ICD-10-CM | POA: Diagnosis not present

## 2014-07-14 MED ORDER — FLUTICASONE PROPIONATE 50 MCG/ACT NA SUSP: NASAL | Status: DC

## 2014-07-14 NOTE — Assessment & Plan Note (Addendum)
Unremarkable physical exam today. Influenza vaccine given today. Colonoscopy was done at 38 due to maternal history of colon cancer, mammogram and Pap smears were normal 2 weeks ago. Mild rhinitis, adding Flonase.

## 2014-07-14 NOTE — Assessment & Plan Note (Signed)
Unfortunately pain has returned after viscous supplementation. Return in 1.5 months and we can consider repeat steroid injection If no effect she would be a candidate for arthroscopy.

## 2014-07-14 NOTE — Progress Notes (Signed)
  Subjective:    CC: Complete physical  HPI:  This is a very pleasant 48 year old female, she is here for her physical, she is up-to-date on all screening measures, her colonoscopy was done when she was 38 due to early family history of colon cancer. She does have a slight sore throat, and would like me to take a look.  Past medical history, Surgical history, Family history not pertinant except as noted below, Social history, Allergies, and medications have been entered into the medical record, reviewed, and no changes needed.   Review of Systems: No headache, visual changes, nausea, vomiting, diarrhea, constipation, dizziness, abdominal pain, skin rash, fevers, chills, night sweats, swollen lymph nodes, weight loss, chest pain, body aches, joint swelling, muscle aches, shortness of breath, mood changes, visual or auditory hallucinations.  Objective:    General: Well Developed, well nourished, and in no acute distress.  Neuro: Alert and oriented x3, extra-ocular muscles intact, sensation grossly intact. Cranial nerves II through XII are intact, motor, sensory, and coordinative functions are all intact. HEENT: Normocephalic, atraumatic, pupils equal round reactive to light, neck supple, no masses, no lymphadenopathy, thyroid nonpalpable. Oropharynx, nasopharynx, external ear canals are unremarkable, with the exception of nasal turbinates being slightly boggy. Skin: Warm and dry, no rashes noted.  Cardiac: Regular rate and rhythm, no murmurs rubs or gallops.  Respiratory: Clear to auscultation bilaterally. Not using accessory muscles, speaking in full sentences.  Abdominal: Soft, nontender, nondistended, positive bowel sounds, no masses, no organomegaly.  Musculoskeletal: Shoulder, elbow, wrist, hip, knee, ankle stable, and with full range of motion.  Impression and Recommendations:    The patient was counselled, risk factors were discussed, anticipatory guidance given.

## 2014-07-14 NOTE — Assessment & Plan Note (Signed)
Good control

## 2014-08-23 ENCOUNTER — Other Ambulatory Visit: Payer: Self-pay | Admitting: Physician Assistant

## 2014-08-25 ENCOUNTER — Ambulatory Visit (INDEPENDENT_AMBULATORY_CARE_PROVIDER_SITE_OTHER): Payer: BC Managed Care – PPO | Admitting: Sports Medicine

## 2014-08-25 ENCOUNTER — Encounter: Payer: Self-pay | Admitting: Sports Medicine

## 2014-08-25 VITALS — BP 145/79 | HR 83 | Ht 64.0 in | Wt 240.0 lb

## 2014-08-25 DIAGNOSIS — M722 Plantar fascial fibromatosis: Secondary | ICD-10-CM | POA: Diagnosis not present

## 2014-08-25 DIAGNOSIS — M2240 Chondromalacia patellae, unspecified knee: Secondary | ICD-10-CM | POA: Diagnosis not present

## 2014-08-25 MED ORDER — DICLOFENAC SODIUM 75 MG PO TBEC: 75.0000 mg | DELAYED_RELEASE_TABLET | Freq: Two times a day (BID) | ORAL | Status: DC

## 2014-08-25 NOTE — Progress Notes (Signed)
  Subjective:    CC: Follow-up  HPI: Bilateral knee osteoarthritis: Going on a trip soon, it has been 3 months and she desires a repeat injection.  Left heel pain: Localize on the plantar aspect of the calcaneal insertion of the plantar fascia, moderate, persistent, worse in the morning. No radiation.  Past medical history, Surgical history, Family history not pertinant except as noted below, Social history, Allergies, and medications have been entered into the medical record, reviewed, and no changes needed.   Review of Systems: No fevers, chills, night sweats, weight loss, chest pain, or shortness of breath.   Objective:    General: Well Developed, well nourished, and in no acute distress.  Neuro: Alert and oriented x3, extra-ocular muscles intact, sensation grossly intact.  HEENT: Normocephalic, atraumatic, pupils equal round reactive to light, neck supple, no masses, no lymphadenopathy, thyroid nonpalpable.  Skin: Warm and dry, no rashes. Cardiac: Regular rate and rhythm, no murmurs rubs or gallops, no lower extremity edema.  Respiratory: Clear to auscultation bilaterally. Not using accessory muscles, speaking in full sentences. Foot: No visible erythema or swelling. Range of motion is full in all directions. Strength is 5/5 in all directions. No hallux valgus. No pes cavus or pes planus. No abnormal callus noted. No pain over the navicular prominence, or base of fifth metatarsal. Tender to palpation of the calcaneal insertion of plantar fascia. No pain at the Achilles insertion. No pain over the calcaneal bursa. No pain of the retrocalcaneal bursa. No tenderness to palpation over the tarsals, metatarsals, or phalanges. No hallux rigidus or limitus. No tenderness palpation over interphalangeal joints. No pain with compression of the metatarsal heads. Neurovascularly intact distally.  Procedure: Real-time Ultrasound Guided Injection of left knee  Device: GE Logiq E  Verbal  informed consent obtained.  Time-out conducted.  Noted no overlying erythema, induration, or other signs of local infection.  Skin prepped in a sterile fashion.  Local anesthesia: Topical Ethyl chloride.  With sterile technique and under real time ultrasound guidance: 2 mL kenalog 40, 4 mL lidocaine injected easily.  Completed without difficulty  Pain immediately resolved suggesting accurate placement of the medication.  Advised to call if fevers/chills, erythema, induration, drainage, or persistent bleeding.  Images permanently stored and available for review in the ultrasound unit.  Impression: Technically successful ultrasound guided injection.  Procedure: Real-time Ultrasound Guided Injection of right knee  Device: GE Logiq E  Verbal informed consent obtained.  Time-out conducted.  Noted no overlying erythema, induration, or other signs of local infection.  Skin prepped in a sterile fashion.  Local anesthesia: Topical Ethyl chloride.  With sterile technique and under real time ultrasound guidance: 2 mL kenalog 40, 4 mL lidocaine injected easily.  Completed without difficulty  Pain immediately resolved suggesting accurate placement of the medication.  Advised to call if fevers/chills, erythema, induration, drainage, or persistent bleeding.  Images permanently stored and available for review in the ultrasound unit.  Impression: Technically successful ultrasound guided injection.  Impression and Recommendations:

## 2014-08-25 NOTE — Assessment & Plan Note (Signed)
Gel cups and rehabilitation exercises given.

## 2014-08-25 NOTE — Assessment & Plan Note (Signed)
Bilateral injection as above. If no response or insufficient duration of response we will refer for arthroscopy.

## 2014-09-26 DIAGNOSIS — K635 Polyp of colon: Secondary | ICD-10-CM

## 2014-09-26 HISTORY — DX: Polyp of colon: K63.5

## 2014-09-26 HISTORY — PX: COLONOSCOPY: SHX174

## 2014-10-13 ENCOUNTER — Ambulatory Visit: Payer: Self-pay | Admitting: Sports Medicine

## 2014-11-19 ENCOUNTER — Ambulatory Visit (INDEPENDENT_AMBULATORY_CARE_PROVIDER_SITE_OTHER): Payer: BLUE CROSS/BLUE SHIELD | Admitting: Physician Assistant

## 2014-11-19 ENCOUNTER — Encounter: Payer: Self-pay | Admitting: Physician Assistant

## 2014-11-19 ENCOUNTER — Telehealth: Payer: Self-pay | Admitting: *Deleted

## 2014-11-19 VITALS — BP 124/73 | HR 74 | Temp 98.0°F | Ht 64.0 in | Wt 241.0 lb

## 2014-11-19 DIAGNOSIS — R110 Nausea: Secondary | ICD-10-CM

## 2014-11-19 DIAGNOSIS — R300 Dysuria: Secondary | ICD-10-CM | POA: Diagnosis not present

## 2014-11-19 DIAGNOSIS — N3001 Acute cystitis with hematuria: Secondary | ICD-10-CM | POA: Diagnosis not present

## 2014-11-19 LAB — POCT URINALYSIS DIPSTICK
Bilirubin, UA: NEGATIVE
GLUCOSE UA: NEGATIVE
Nitrite, UA: NEGATIVE
Spec Grav, UA: 1.02
Urobilinogen, UA: 0.2
pH, UA: 6

## 2014-11-19 MED ORDER — CIPROFLOXACIN 500 MG/5ML (10%) PO SUSR: 500.0000 mg | Freq: Two times a day (BID) | ORAL | Status: DC

## 2014-11-19 NOTE — Telephone Encounter (Signed)
Called multiple [place] pharmacies and no one has liquid cipro so gave a verbal order for bactrim suspension bid for 8 days.

## 2014-11-19 NOTE — Telephone Encounter (Signed)
Can we call another pharmacy to see if she does have.

## 2014-11-19 NOTE — Progress Notes (Signed)
   Subjective:    Patient ID: Wanda Valencia, female    DOB: 08-Feb-1966, 49 y.o.   MRN: [id number]  HPI Patient is a 49 year old female who presents to the clinic with UTI symptoms that started yesterday morning with painful urination. She has a history of UTIs. This morning she woke up with a few sharp pains in her lower right abdomen. She also felt very nauseated this morning. She has not taken anything to make better. She denies any flank pain. She denies any fever, chills, or bowel changes.   Review of Systems  All other systems reviewed and are negative.      Objective:   Physical Exam  Constitutional: She is oriented to person, place, and time. She appears well-developed and well-nourished.  HENT:  Head: Normocephalic and atraumatic.  Cardiovascular: Normal rate, regular rhythm and normal heart sounds.   Pulmonary/Chest: Effort normal and breath sounds normal. She has no wheezes.  No CVA tenderness.  Abdominal: Soft. Bowel sounds are normal. She exhibits no distension and no mass. There is no tenderness. There is no rebound and no guarding.  Neurological: She is alert and oriented to person, place, and time.  Skin: Skin is dry.  Psychiatric: She has a normal mood and affect. Her behavior is normal.          Assessment & Plan:  Acute cystitis-.. Results for orders placed or performed in visit on 11/19/14  POCT urinalysis dipstick  Result Value Ref Range   Color, UA dark yellow    Clarity, UA slightly cloudy    Glucose, UA neg    Bilirubin, UA neg    Ketones, UA trace    Spec Grav, UA 1.020    Blood, UA trace-intact    pH, UA 6.0    Protein, UA trace    Urobilinogen, UA 0.2    Nitrite, UA neg    Leukocytes, UA moderate (2+)    Treated with cipro. She does not tolerate pills. We did send over solution for 3 days. Handout given for UTIs. Encouraged hydration. She could also try peridium over-the-counter to relieve some symptoms. Offered antinausea. Patient declined.  Follow-up if symptoms persist or worsening.

## 2014-11-19 NOTE — Patient Instructions (Signed)

## 2014-11-19 NOTE — Telephone Encounter (Signed)
Pt left vm stating that the pharm is out of the liquid cipro and won't be able to get any until tomorrow.  She wanted to know if there was something else that can be sent.  Please advise.

## 2014-11-24 ENCOUNTER — Encounter: Payer: Self-pay | Admitting: Physician Assistant

## 2014-11-24 ENCOUNTER — Encounter: Payer: Self-pay | Admitting: Sports Medicine

## 2014-11-24 MED ORDER — LISINOPRIL-HYDROCHLOROTHIAZIDE 20-25 MG PO TABS: 1.0000 | ORAL_TABLET | Freq: Every day | ORAL | Status: DC

## 2014-12-08 ENCOUNTER — Other Ambulatory Visit: Payer: Self-pay | Admitting: Physician Assistant

## 2014-12-09 ENCOUNTER — Ambulatory Visit (INDEPENDENT_AMBULATORY_CARE_PROVIDER_SITE_OTHER): Payer: BLUE CROSS/BLUE SHIELD | Admitting: Sports Medicine

## 2014-12-09 ENCOUNTER — Encounter: Payer: Self-pay | Admitting: Sports Medicine

## 2014-12-09 VITALS — BP 112/80 | HR 70 | Wt 238.0 lb

## 2014-12-09 DIAGNOSIS — B079 Viral wart, unspecified: Secondary | ICD-10-CM

## 2014-12-09 NOTE — Assessment & Plan Note (Signed)
Aggressive cryotherapy 1. Return in 2 weeks. We can repeat cryotherapy versus consideration of Aldara versus surgical removal.

## 2014-12-09 NOTE — Patient Instructions (Signed)
Warts Warts are a common viral infection. They are most commonly caused by the human papillomavirus (HPV). Warts can occur at all ages. However, they occur most frequently in older children and infrequently in the elderly. Warts may be single or multiple. Location and size varies. Warts can be spread by scratching the wart and then scratching normal skin. The life cycle of warts varies. However, most will disappear over many months to a couple years. Warts commonly do not cause problems (asymptomatic) unless they are over an area of pressure, such as the bottom of the foot. If they are large enough, they may cause pain with walking. DIAGNOSIS  Warts are most commonly diagnosed by their appearance. Tissue samples (biopsies) are not required unless the wart looks abnormal. Most warts have a rough surface, are round, oval, or irregular, and are skin-colored to light yellow, brown, or gray. They are generally less than  inch (1.3 cm), but they can be any size. TREATMENT   Observation or no treatment.  Freezing with liquid nitrogen.  High heat (cautery).  Boosting the body's immunity to fight off the wart (immunotherapy using Candida antigen).  Laser surgery.  Application of various irritants and solutions. HOME CARE INSTRUCTIONS  Follow your caregiver's instructions. No special precautions are necessary. Often, treatment may be followed by a return (recurrence) of warts. Warts are generally difficult to treat and get rid of. If treatment is done in a clinic setting, usually more than 1 treatment is required. This is usually done on only a monthly basis until the wart is completely gone. SEEK IMMEDIATE MEDICAL CARE IF: The treated skin becomes red, puffy (swollen), or painful. Document Released: 06/22/2005 Document Revised: 01/07/2013 Document Reviewed: 12/18/2009 Exit[employer] Patient Information 2015 [employer]. This information is not intended to replace advice given to you by your health care  provider. Make sure you discuss any questions you have with your health care provider.  

## 2014-12-09 NOTE — Progress Notes (Signed)
  Subjective:    CC: Thumb lesion  HPI: For the past several weeks this pleasant 49 year old female has had a lesion on her right thumb, is approximately 1 cm across, nontender, she thinks it's a cyst, but she did try an over-the-counter cryotherapy which did not work. Symptoms are mild, persistent.  Past medical history, Surgical history, Family history not pertinant except as noted below, Social history, Allergies, and medications have been entered into the medical record, reviewed, and no changes needed.   Review of Systems: No fevers, chills, night sweats, weight loss, chest pain, or shortness of breath.   Objective:    General: Well Developed, well nourished, and in no acute distress.  Neuro: Alert and oriented x3, extra-ocular muscles intact, sensation grossly intact.  HEENT: Normocephalic, atraumatic, pupils equal round reactive to light, neck supple, no masses, no lymphadenopathy, thyroid nonpalpable.  Skin: Warm and dry, no rashes. Cardiac: Regular rate and rhythm, no murmurs rubs or gallops, no lower extremity edema.  Respiratory: Clear to auscultation bilaterally. Not using accessory muscles, speaking in full sentences. Right hand: There is a dome shaped 1 cm fleshy lesion with visible thrombosed capillaries over the dorsum of the right first interphalangeal joint. It is nontender.  Procedure:  Cryodestruction of right thumb verruca Consent obtained and verified. Time-out conducted. Noted no overlying erythema, induration, or other signs of local infection. Completed without difficulty using Cryo-Gun. Advised to call if fevers/chills, erythema, induration, drainage, or persistent bleeding.  Impression and Recommendations:

## 2014-12-23 ENCOUNTER — Encounter: Payer: Self-pay | Admitting: Sports Medicine

## 2014-12-23 ENCOUNTER — Ambulatory Visit (INDEPENDENT_AMBULATORY_CARE_PROVIDER_SITE_OTHER): Payer: BLUE CROSS/BLUE SHIELD | Admitting: Sports Medicine

## 2014-12-23 VITALS — BP 106/69 | HR 80 | Ht 64.0 in | Wt 241.0 lb

## 2014-12-23 DIAGNOSIS — B079 Viral wart, unspecified: Secondary | ICD-10-CM | POA: Diagnosis not present

## 2014-12-23 NOTE — Progress Notes (Signed)
  Subjective:    CC: Recheck wart  HPI: This pleasant 49 year old female had cryotherapy about 2-3 weeks ago, there was a fantastic response with near complete resolution of the wart on her thumb, she is a minimal to do a single additional session.  Past medical history, Surgical history, Family history not pertinant except as noted below, Social history, Allergies, and medications have been entered into the medical record, reviewed, and no changes needed.   Review of Systems: No fevers, chills, night sweats, weight loss, chest pain, or shortness of breath.   Objective:    General: Well Developed, well nourished, and in no acute distress.  Neuro: Alert and oriented x3, extra-ocular muscles intact, sensation grossly intact.  HEENT: Normocephalic, atraumatic, pupils equal round reactive to light, neck supple, no masses, no lymphadenopathy, thyroid nonpalpable.  Skin: Warm and dry, no rashes. The wart on her left thumb is almost completely gone, its flat, with only a few small dark spots. Cardiac: Regular rate and rhythm, no murmurs rubs or gallops, no lower extremity edema.  Respiratory: Clear to auscultation bilaterally. Not using accessory muscles, speaking in full sentences.  Procedure:  Cryodestruction of left thumb verruca Consent obtained and verified. Time-out conducted. Noted no overlying erythema, induration, or other signs of local infection. Completed without difficulty using Cryo-Gun. Advised to call if fevers/chills, erythema, induration, drainage, or persistent bleeding.  Impression and Recommendations:

## 2014-12-23 NOTE — Assessment & Plan Note (Signed)
Excellent response to initial cryotherapy, repeat cryotherapy today. I do suspect complete resolution.

## 2015-02-06 ENCOUNTER — Ambulatory Visit (INDEPENDENT_AMBULATORY_CARE_PROVIDER_SITE_OTHER): Payer: BLUE CROSS/BLUE SHIELD | Admitting: Sports Medicine

## 2015-02-06 ENCOUNTER — Encounter: Payer: Self-pay | Admitting: Sports Medicine

## 2015-02-06 VITALS — BP 96/54 | HR 80 | Ht 64.0 in | Wt 243.0 lb

## 2015-02-06 DIAGNOSIS — M2240 Chondromalacia patellae, unspecified knee: Secondary | ICD-10-CM

## 2015-02-06 NOTE — Progress Notes (Signed)
  Subjective:    CC: Follow-up  HPI: Osteoarthritis of both knees: Previous injection was 6 months ago. Unfortunately are only lasting about 6 weeks which makes her a candidate for operative arthroscopy. She does desire repeat interventional treatment today. Pain is moderate, persistent, localized at the medial joint line as well as under the kneecaps without radiation or mechanical symptoms.  Past medical history, Surgical history, Family history not pertinant except as noted below, Social history, Allergies, and medications have been entered into the medical record, reviewed, and no changes needed.   Review of Systems: No fevers, chills, night sweats, weight loss, chest pain, or shortness of breath.   Objective:    General: Well Developed, well nourished, and in no acute distress.  Neuro: Alert and oriented x3, extra-ocular muscles intact, sensation grossly intact.  HEENT: Normocephalic, atraumatic, pupils equal round reactive to light, neck supple, no masses, no lymphadenopathy, thyroid nonpalpable.  Skin: Warm and dry, no rashes. Cardiac: Regular rate and rhythm, no murmurs rubs or gallops, no lower extremity edema.  Respiratory: Clear to auscultation bilaterally. Not using accessory muscles, speaking in full sentences.  Procedure: Real-time Ultrasound Guided Injection of left knee  Device: GE Logiq E  Verbal informed consent obtained.  Time-out conducted.  Noted no overlying erythema, induration, or other signs of local infection.  Skin prepped in a sterile fashion.  Local anesthesia: Topical Ethyl chloride.  With sterile technique and under real time ultrasound guidance: 2 mL kenalog 40, 4 mL lidocaine injected easily.  Completed without difficulty  Pain immediately resolved suggesting accurate placement of the medication.  Advised to call if fevers/chills, erythema, induration, drainage, or persistent bleeding.  Images permanently stored and available for review in  the ultrasound unit.  Impression: Technically successful ultrasound guided injection.  Procedure: Real-time Ultrasound Guided Injection of right knee  Device: GE Logiq E  Verbal informed consent obtained.  Time-out conducted.  Noted no overlying erythema, induration, or other signs of local infection.  Skin prepped in a sterile fashion.  Local anesthesia: Topical Ethyl chloride.  With sterile technique and under real time ultrasound guidance: 2 mL kenalog 40, 4 mL lidocaine injected easily.  Completed without difficulty  Pain immediately resolved suggesting accurate placement of the medication.  Advised to call if fevers/chills, erythema, induration, drainage, or persistent bleeding.  Images permanently stored and available for review in the ultrasound unit.  Impression: Technically successful ultrasound guided injection.  Impression and Recommendations:

## 2015-02-06 NOTE — Assessment & Plan Note (Signed)
Bilateral injection as above, previous injection was 6 months ago. Injections are however only lasting approximately 6 weeks so she will schedule with Dr. Luiz BlareGraves for arthroscopy. Certainly we can proceed with another injection plus/minus Visco supplementation afterwards.

## 2015-02-09 LAB — HM COLONOSCOPY

## 2015-02-26 ENCOUNTER — Encounter: Payer: Self-pay | Admitting: *Deleted

## 2015-02-26 ENCOUNTER — Emergency Department
Admission: EM | Admit: 2015-02-26 | Discharge: 2015-02-26 | Disposition: A | Discharge status: Home / Self Care | Payer: BLUE CROSS/BLUE SHIELD | Source: Home / Self Care | Attending: Emergency Medicine | Admitting: Emergency Medicine

## 2015-02-26 DIAGNOSIS — R358 Other polyuria: Secondary | ICD-10-CM | POA: Diagnosis not present

## 2015-02-26 DIAGNOSIS — N3000 Acute cystitis without hematuria: Secondary | ICD-10-CM | POA: Diagnosis not present

## 2015-02-26 DIAGNOSIS — R3589 Other polyuria: Secondary | ICD-10-CM

## 2015-02-26 LAB — POCT URINALYSIS DIP (MANUAL ENTRY)
Bilirubin, UA: NEGATIVE
Blood, UA: NEGATIVE
Glucose, UA: NEGATIVE
Ketones, POC UA: NEGATIVE
Nitrite, UA: NEGATIVE
Protein Ur, POC: NEGATIVE
Spec Grav, UA: 1.025 (ref 1.005–1.03)
Urobilinogen, UA: 0.2 (ref 0–1)
pH, UA: 6 (ref 5–8)

## 2015-02-26 MED ORDER — CIPROFLOXACIN 500 MG/5ML (10%) PO SUSR
500.0000 mg | Freq: Two times a day (BID) | ORAL | Status: DC
Start: 2015-02-26 — End: 2015-03-27

## 2015-02-26 NOTE — ED Provider Notes (Signed)
CSN: [id number]     Arrival date & time 02/26/15  1543 History   First MD Initiated Contact with Patient 02/26/15 1557     Chief Complaint  Patient presents with  . Polyuria  . Nausea   (Consider location/radiation/quality/duration/timing/severity/associated sxs/prior Treatment) HPI This is a 49 y.o. female who presents today with UTI symptoms for 9 days.  + dysuria + frequency + urgency No hematuria No vaginal discharge Positive for low-grade fever and sweats. No chills Mild suprapubic lower abdominal pain. Mild bilateral flank pain Mild nausea, but she is tolerating by mouth's No vomiting No back pain No fatigue She denies chance of pregnancy. Has tried over-the-counter measures without improvement.   She has a history of recurrent UTIs, last one about 4 months ago, treated effectively with Cipro She denies seeing a urologist recently for evaluation of recurrent UTIs.  No cardiorespiratory symptoms. Past Medical History  Diagnosis Date  . Incontinence   . Retina disorder   . Hypertension   . Overactive bladder    Past Surgical History  Procedure Laterality Date  . Cesarean section    . Tubal ligation    . Kidney stone surgery    . Tubal ligation  10/14/2011   Family History  Problem Relation Age of Onset  . Hypertension Mother   . Depression Mother   . Hypertension Father   . Cancer Father     tongue/kidney   History  Substance Use Topics  . Smoking status: Former Games developermoker  . Smokeless tobacco: Not on file  . Alcohol Use: Yes   OB History    No data available     Review of Systems  All other systems reviewed and are negative.   Allergies  Review of patient's allergies indicates no known allergies.  Home Medications   Prior to Admission medications   Medication Sig Start Date End Date Taking? Authorizing Provider  acetaminophen (TYLENOL) 500 MG tablet Take 1,000 mg by mouth every 6 (six) hours as needed.    Historical Provider, MD  aspirin 81 MG  tablet Take 81 mg by mouth daily.    Historical Provider, MD  ciprofloxacin (CIPRO) 500 MG/5ML (10%) suspension Take 5 mLs (500 mg total) by mouth 2 (two) times daily. For 10 days 02/26/15   Lajean Manesavid Massey, MD  diclofenac (VOLTAREN) 75 MG EC tablet Take 1 tablet (75 mg total) by mouth 2 (two) times daily. 08/25/14   Monica Bectonhomas J Thekkekandam, MD  fluticasone (FLONASE) 50 MCG/ACT nasal spray One spray in each nostril twice a day, use left hand for right nostril, and right hand for left nostril. Patient taking differently: Place 1 spray into both nostrils as needed. One spray in each nostril twice a day, use left hand for right nostril, and right hand for left nostril. 07/14/14   Monica Bectonhomas J Thekkekandam, MD  lisinopril-hydrochlorothiazide (PRINZIDE,ZESTORETIC) 20-25 MG per tablet Take 1 tablet by mouth daily. 11/24/14   Monica Bectonhomas J Thekkekandam, MD   BP 107/73 mmHg  Pulse 87  Temp(Src) 97.8 F (36.6 C) (Oral)  Resp 16  Wt 236 lb (107.049 kg)  SpO2 98%  LMP 09/29/2011 Physical Exam  Constitutional: She is oriented to person, place, and time. She appears well-developed and well-nourished. No distress.  HENT:  Mouth/Throat: Oropharynx is clear and moist.  Eyes: No scleral icterus.  Neck: Neck supple.  Cardiovascular: Normal rate, regular rhythm and normal heart sounds.   Pulmonary/Chest: Breath sounds normal.  Abdominal: Soft. She exhibits no mass. There is no hepatosplenomegaly. There  is tenderness in the suprapubic area. There is no rebound, no guarding and no CVA tenderness.  Lymphadenopathy:    She has no cervical adenopathy.  Neurological: She is alert and oriented to person, place, and time.  Skin: Skin is warm and dry.  Nursing note and vitals reviewed.   ED Course  Procedures (including critical care time) Labs Review Labs Reviewed  URINE CULTURE  POCT URINALYSIS DIP (MANUAL ENTRY)   Results for orders placed or performed during the hospital encounter of 02/26/15  POCT urinalysis dipstick  (new)  Result Value Ref Range   Color, UA yellow    Clarity, UA clear    Glucose, UA neg    Bilirubin, UA negative    Bilirubin, UA negative    Spec Grav, UA 1.025 1.005 - 1.03   Blood, UA negative    pH, UA 6.0 5 - 8   Protein Ur, POC negative    Urobilinogen, UA 0.2 0 - 1   Nitrite, UA Negative    Leukocytes, UA moderate (2+)      MDM   1. Polyuria   2. Acute cystitis without hematuria    Treatment options discussed, as well as risks, benefits, alternatives. She requests antibiotic liquid form. Patient voiced understanding and agreement with the following plans: ciprofloxacin (CIPRO) 500 MG/5ML (10%) suspension Take 5 mLs (500 mg total) by mouth 2 (two) times daily. For 10 days 100 mL  Urine culture sent Other symptomatic care discussed Follow-up with your primary care doctor in 10 days , sooner  if not improving, or sooner if symptoms become worse. Advised her to discuss with her PCP possibility of referral to urologist for recurrent UTIs. Precautions discussed. Red flags discussed. Questions invited and answered. Patient voiced understanding and agreement.       Lajean Manes, MD 02/26/15 [phone]

## 2015-02-26 NOTE — ED Notes (Signed)
Pt c/o 9 days of polyuria and nausea. Also c/o sweats and flank pain in AM.

## 2015-02-28 ENCOUNTER — Telehealth: Payer: Self-pay | Admitting: *Deleted

## 2015-02-28 LAB — URINE CULTURE: Colony Count: 50000

## 2015-03-20 ENCOUNTER — Encounter: Payer: Self-pay | Admitting: Emergency Medicine

## 2015-03-20 ENCOUNTER — Emergency Department (INDEPENDENT_AMBULATORY_CARE_PROVIDER_SITE_OTHER)
Admission: EM | Admit: 2015-03-20 | Discharge: 2015-03-20 | Disposition: A | Discharge status: Home / Self Care | Payer: BLUE CROSS/BLUE SHIELD | Source: Home / Self Care | Attending: Family Medicine | Admitting: Family Medicine

## 2015-03-20 DIAGNOSIS — N3000 Acute cystitis without hematuria: Secondary | ICD-10-CM | POA: Diagnosis not present

## 2015-03-20 LAB — POCT URINALYSIS DIP (MANUAL ENTRY)
BILIRUBIN UA: NEGATIVE
BILIRUBIN UA: NEGATIVE
Glucose, UA: NEGATIVE
Nitrite, UA: NEGATIVE
Protein Ur, POC: NEGATIVE
RBC UA: NEGATIVE
SPEC GRAV UA: 1.025 (ref 1.005–1.03)
Urobilinogen, UA: 0.2 (ref 0–1)
pH, UA: 7 (ref 5–8)

## 2015-03-20 MED ORDER — CEPHALEXIN 250 MG/5ML PO SUSR: ORAL | Status: DC

## 2015-03-20 NOTE — ED Provider Notes (Signed)
CSN: [id number]     Arrival date & time 03/20/15  1558 History   First MD Initiated Contact with Patient 03/20/15 1722     Chief Complaint  Patient presents with  . Urinary Frequency      HPI Comments: Patient complains of five day history of urinary frequency and mild nausea without vomiting.  She developed mild dysuria today.  No fevers, chills, and sweats.  No abdominal pain.  She has a past history of bladder polyps in 2007.  Patient's last menstrual period was 09/29/2011.   Patient is a 49 y.o. female presenting with dysuria. The history is provided by the patient.  Dysuria Pain quality:  Burning Pain severity:  Mild Onset quality:  Sudden Duration:  1 day Timing:  Constant Progression:  Worsening Chronicity:  New Recent urinary tract infections: no   Relieved by:  None tried Worsened by:  Nothing tried Ineffective treatments:  None tried Urinary symptoms: frequent urination and hesitancy   Urinary symptoms: no discolored urine, no foul-smelling urine, no hematuria and no bladder incontinence   Associated symptoms: nausea   Associated symptoms: no abdominal pain, no fever, no flank pain, no genital lesions, no vaginal discharge and no vomiting   Risk factors: no recurrent urinary tract infections     Past Medical History  Diagnosis Date  . Incontinence   . Retina disorder   . Hypertension   . Overactive bladder    Past Surgical History  Procedure Laterality Date  . Cesarean section    . Tubal ligation    . Kidney stone surgery    . Tubal ligation  10/14/2011   Family History  Problem Relation Age of Onset  . Hypertension Mother   . Depression Mother   . Hypertension Father   . Cancer Father     tongue/kidney   History  Substance Use Topics  . Smoking status: Former Games developer  . Smokeless tobacco: Not on file  . Alcohol Use: Yes   OB History    No data available     Review of Systems  Constitutional: Negative for fever.  Gastrointestinal: Positive for  nausea. Negative for vomiting and abdominal pain.  Genitourinary: Positive for dysuria. Negative for flank pain and vaginal discharge.  All other systems reviewed and are negative.   Allergies  Review of patient's allergies indicates no known allergies.  Home Medications   Prior to Admission medications   Medication Sig Start Date End Date Taking? Authorizing Provider  acetaminophen (TYLENOL) 500 MG tablet Take 1,000 mg by mouth every 6 (six) hours as needed.    Historical Provider, MD  aspirin 81 MG tablet Take 81 mg by mouth daily.    Historical Provider, MD  cephALEXin (KEFLEX) 250 MG/5ML suspension Take 38mL by mouth twice daily (every 12 hours) 03/20/15   Lattie Haw, MD  ciprofloxacin (CIPRO) 500 MG/5ML (10%) suspension Take 5 mLs (500 mg total) by mouth 2 (two) times daily. For 10 days 02/26/15   Lajean Manes, MD  diclofenac (VOLTAREN) 75 MG EC tablet Take 1 tablet (75 mg total) by mouth 2 (two) times daily. 08/25/14   Monica Becton, MD  fluticasone (FLONASE) 50 MCG/ACT nasal spray One spray in each nostril twice a day, use left hand for right nostril, and right hand for left nostril. Patient taking differently: Place 1 spray into both nostrils as needed. One spray in each nostril twice a day, use left hand for right nostril, and right hand for left nostril. 07/14/14  Monica Becton, MD  lisinopril-hydrochlorothiazide (PRINZIDE,ZESTORETIC) 20-25 MG per tablet Take 1 tablet by mouth daily. 11/24/14   Monica Becton, MD   BP 97/66 mmHg  Pulse 68  Temp(Src) 98.3 F (36.8 C) (Oral)  Resp 16  Ht  (1.626 m)  Wt 235 lb (106.595 kg)  BMI 40.32 kg/m2  SpO2 97%  LMP 09/29/2011 Physical Exam Nursing notes and Vital Signs reviewed. Appearance:  Patient appears stated age, and in no acute distress Eyes:  Pupils are equal, round, and reactive to light and accomodation.  Extraocular movement is intact.  Conjunctivae are not inflamed  Nose:  Normal Pharynx:   Normal; moist mucous membranes  Neck:  Supple.  No adenopathy Lungs:  Clear to auscultation.  Breath sounds are equal.  Heart:  Regular rate and rhythm without murmurs, rubs, or gallops.  Abdomen:  Nontender without masses or hepatosplenomegaly.  Bowel sounds are present.  No CVA or flank tenderness.  Extremities:  No edema.  No calf tenderness Skin:  No rash present.   ED Course  Procedures  None    Labs Reviewed  POCT URINALYSIS DIP (MANUAL ENTRY) - Abnormal; Notable for the following:    Color, UA light yellow (*)    Leukocytes, UA Trace (*)    All other components within normal limits  URINE CULTURE      MDM   1. Acute cystitis without hematuria    Urine culture pending.  Begin Keflex suspension /17mL, 10mL Q12 hours. Increase fluid intake.  May use non-prescription AZO for about two days, if desired, to decrease urinary discomfort.  If symptoms become significantly worse during the night or over the weekend, proceed to the local emergency room.  Followup with Family Doctor if not improved in one week.     Lattie Haw, MD 03/24/15 2013

## 2015-03-20 NOTE — ED Notes (Signed)
Reports urinary frequency x 5 days; second UTI in recent history.

## 2015-03-20 NOTE — ED Notes (Signed)
Bed: K[facility]3 Expected date:  Expected time:  Means of arrival:  Comments: [facility]

## 2015-03-20 NOTE — Discharge Instructions (Signed)
Increase fluid intake.  May use non-prescription AZO for about two days, if desired, to decrease urinary discomfort.  °If symptoms become significantly worse during the night or over the weekend, proceed to the local emergency room.  ° ° °Urinary Tract Infection °Urinary tract infections (UTIs) can develop anywhere along your urinary tract. Your urinary tract is your body's drainage system for removing wastes and extra water. Your urinary tract includes two kidneys, two ureters, a bladder, and a urethra. Your kidneys are a pair of bean-shaped organs. Each kidney is about the size of your fist. They are located below your ribs, one on each side of your spine. °CAUSES °Infections are caused by microbes, which are microscopic organisms, including fungi, viruses, and bacteria. These organisms are so small that they can only be seen through a microscope. Bacteria are the microbes that most commonly cause UTIs. °SYMPTOMS  °Symptoms of UTIs may vary by age and gender of the patient and by the location of the infection. Symptoms in young women typically include a frequent and intense urge to urinate and a painful, burning feeling in the bladder or urethra during urination. Older women and men are more likely to be tired, shaky, and weak and have muscle aches and abdominal pain. A fever may mean the infection is in your kidneys. Other symptoms of a kidney infection include pain in your back or sides below the ribs, nausea, and vomiting. °DIAGNOSIS °To diagnose a UTI, your caregiver will ask you about your symptoms. Your caregiver also will ask to provide a urine sample. The urine sample will be tested for bacteria and white blood cells. White blood cells are made by your body to help fight infection. °TREATMENT  °Typically, UTIs can be treated with medication. Because most UTIs are caused by a bacterial infection, they usually can be treated with the use of antibiotics. The choice of antibiotic and length of treatment depend  on your symptoms and the type of bacteria causing your infection. °HOME CARE INSTRUCTIONS °· If you were prescribed antibiotics, take them exactly as your caregiver instructs you. Finish the medication even if you feel better after you have only taken some of the medication. °· Drink enough water and fluids to keep your urine clear or pale yellow. °· Avoid caffeine, tea, and carbonated beverages. They tend to irritate your bladder. °· Empty your bladder often. Avoid holding urine for long periods of time. °· Empty your bladder before and after sexual intercourse. °· After a bowel movement, women should cleanse from front to back. Use each tissue only once. °SEEK MEDICAL CARE IF:  °· You have back pain. °· You develop a fever. °· Your symptoms do not begin to resolve within 3 days. °SEEK IMMEDIATE MEDICAL CARE IF:  °· You have severe back pain or lower abdominal pain. °· You develop chills. °· You have nausea or vomiting. °· You have continued burning or discomfort with urination. °MAKE SURE YOU:  °· Understand these instructions. °· Will watch your condition. °· Will get help right away if you are not doing well or get worse. °Document Released: 06/22/2005 Document Revised: 03/13/2012 Document Reviewed: 10/21/2011 °ExitCare® Patient Information ©2015 [employer]. This information is not intended to replace advice given to you by your health care provider. Make sure you discuss any questions you have with your health care provider. ° °

## 2015-03-22 LAB — URINE CULTURE: Colony Count: 70000

## 2015-03-27 ENCOUNTER — Ambulatory Visit (INDEPENDENT_AMBULATORY_CARE_PROVIDER_SITE_OTHER): Payer: BLUE CROSS/BLUE SHIELD | Admitting: Family Medicine

## 2015-03-27 ENCOUNTER — Encounter: Payer: Self-pay | Admitting: Family Medicine

## 2015-03-27 VITALS — BP 117/74 | HR 70 | Wt 237.0 lb

## 2015-03-27 DIAGNOSIS — R350 Frequency of micturition: Secondary | ICD-10-CM

## 2015-03-27 DIAGNOSIS — R195 Other fecal abnormalities: Secondary | ICD-10-CM

## 2015-03-27 LAB — POCT URINALYSIS DIPSTICK
Bilirubin, UA: NEGATIVE
Glucose, UA: NEGATIVE
Ketones, UA: NEGATIVE
Nitrite, UA: NEGATIVE
Protein, UA: NEGATIVE
RBC UA: NEGATIVE
SPEC GRAV UA: 1.025
Urobilinogen, UA: 0.2
pH, UA: 6

## 2015-03-27 LAB — POCT HEMOGLOBIN: HEMOGLOBIN: 12.5 g/dL (ref 12.2–16.2)

## 2015-03-27 NOTE — Progress Notes (Signed)
CC: Wanda Valencia is a 49 y.o. female is here for Abdominal Pain   Subjective: HPI:  Since Monday patient has been experiencing lower pelvic pain and nausea and black stool. She denies any diarrhea but she usually only poops once a day now it's been twice a day. Symptoms are mild in severity. Interestingly her husband has identical symptoms that began on Sunday. She only takes medication for blood pressure and begin Keflex late last week but is on no other new medication. No interventions as of yet. Pain is nonradiating. Pain is not related to bowel or bladder habits however she does admit to urinary urgency and frequency. She denies any epigastric pain or back pain. Interestingly she had a colonoscopy in May which was reportedly normal. She takes ranitidine daily for GERD which is currently well controlled on her mind. She denies racing heart, chest pain, shortness of breath, nor fatigue.   Review Of Systems Outlined In HPI  Past Medical History  Diagnosis Date  . Incontinence   . Retina disorder   . Hypertension   . Overactive bladder     Past Surgical History  Procedure Laterality Date  . Cesarean section    . Tubal ligation    . Kidney stone surgery    . Tubal ligation  10/14/2011   Family History  Problem Relation Age of Onset  . Hypertension Mother   . Depression Mother   . Hypertension Father   . Cancer Father     tongue/kidney    History   Social History  . Marital Status: Married    Spouse Name: N/A  . Number of Children: N/A  . Years of Education: N/A   Occupational History  . Not on file.   Social History Main Topics  . Smoking status: Former Games developermoker  . Smokeless tobacco: Not on file  . Alcohol Use: Yes  . Drug Use: No  . Sexual Activity: Not on file   Other Topics Concern  . Not on file   Social History Narrative     Objective: BP 117/74 mmHg  Pulse 70  Wt 237 lb (107.502 kg)  LMP 09/29/2011  General: Alert and Oriented, No Acute  Distress HEENT: Pupils equal, round, reactive to light. Conjunctivae clear. Moist pedis membranes Lungs: Clear and comfortable work of breathing Cardiac: Regular rate and rhythm.  Abdomen: Normal bowel sounds, soft and non tender without palpable masses. Extremities: No peripheral edema.  Strong peripheral pulses.  Mental Status: No depression, anxiety, nor agitation. Skin: Warm and dry.  Assessment & Plan: Wanda Valencia was seen today for abdominal pain.  Diagnoses and all orders for this visit:  Dark stools Orders: -     POCT hemoglobin  Frequent urination Orders: -     Urinalysis Dipstick -     Urine Culture   Dark stools: Fingerstick hemoglobin is reassuring. It seems kind of odd that her husband has identical symptoms which makes me think there is probably something infectious going on as opposed to a GI bleed. Given her comfort level, normal vitals, normal hemoglobin she'll start taking Zofran for nausea and will collect stool samples for stool cards over the weekend to see if there is actually blood that is causing her stool to be black. Signs and symptoms requring emergent/urgent reevaluation were discussed with the patient. Urinalysis is unremarkable will follow culture   Return if symptoms worsen or fail to improve.

## 2015-03-29 LAB — URINE CULTURE
COLONY COUNT: NO GROWTH
ORGANISM ID, BACTERIA: NO GROWTH

## 2015-04-01 ENCOUNTER — Encounter: Payer: Self-pay | Admitting: Emergency Medicine

## 2015-04-01 ENCOUNTER — Emergency Department (INDEPENDENT_AMBULATORY_CARE_PROVIDER_SITE_OTHER)
Admission: EM | Admit: 2015-04-01 | Discharge: 2015-04-01 | Disposition: A | Discharge status: Home / Self Care | Payer: BLUE CROSS/BLUE SHIELD | Source: Home / Self Care | Attending: Family Medicine | Admitting: Family Medicine

## 2015-04-01 ENCOUNTER — Other Ambulatory Visit (INDEPENDENT_AMBULATORY_CARE_PROVIDER_SITE_OTHER): Payer: BLUE CROSS/BLUE SHIELD | Admitting: Sports Medicine

## 2015-04-01 DIAGNOSIS — R195 Other fecal abnormalities: Secondary | ICD-10-CM | POA: Diagnosis not present

## 2015-04-01 DIAGNOSIS — J069 Acute upper respiratory infection, unspecified: Secondary | ICD-10-CM

## 2015-04-01 LAB — POC HEMOCCULT BLD/STL (HOME/3-CARD/SCREEN)
Card #2 Fecal Occult Blod, POC: NEGATIVE
FECAL OCCULT BLD: NEGATIVE
FECAL OCCULT BLD: NEGATIVE

## 2015-04-01 MED ORDER — SALINE SPRAY 0.65 % NA SOLN: 1.0000 | NASAL | Status: DC | PRN

## 2015-04-01 MED ORDER — BENZONATATE 100 MG PO CAPS: 100.0000 mg | ORAL_CAPSULE | Freq: Three times a day (TID) | ORAL | Status: DC

## 2015-04-01 NOTE — Discharge Instructions (Signed)
You may take 400-600mg  Ibuprofen (Motrin) every 6-8 hours for fever and pain  Alternate with Tylenol  You may take  Tylenol every 4-6 hours as needed for fever and pain  Follow-up with your primary care provider next week for recheck of symptoms if not improving.  Be sure to drink plenty of fluids and rest, at least 8hrs of sleep a night, preferably more while you are sick. Return urgent care or go to your closest ER if you cannot keep down fluids/signs of dehydration, fever not reducing with Tylenol, difficulty breathing/wheezing, stiff neck, worsening condition, or other concerns (see below)

## 2015-04-01 NOTE — ED Notes (Signed)
Dry Cough, wheezing, congestion x 3 days

## 2015-04-01 NOTE — ED Provider Notes (Signed)
CSN: [id number]     Arrival date & time 04/01/15  1325 History   First MD Initiated Contact with Patient 04/01/15 1347     Chief Complaint  Patient presents with  . Cough   (Consider location/radiation/quality/duration/timing/severity/associated sxs/prior Treatment) HPI Pt is a 49yo female presenting to [facility] with c/o gradually worsening dry cough, wheeze and nasal congestion with sore throat for 3 days. Cough is mild intermittent in severity.  Pt states she was around two other people with similar symptoms this past weekend, both tested negative for strep throat. She has taken ibuprofen with minimal relief. Pt denies fever, chills, n/v/d. Denies difficulty breathing or swallowing. Denies hx of asthma or COPD. Denies CP, leg pain or swelling.  Past Medical History  Diagnosis Date  . Incontinence   . Retina disorder   . Hypertension   . Overactive bladder    Past Surgical History  Procedure Laterality Date  . Cesarean section    . Tubal ligation    . Kidney stone surgery    . Tubal ligation  10/14/2011   Family History  Problem Relation Age of Onset  . Hypertension Mother   . Depression Mother   . Hypertension Father   . Cancer Father     tongue/kidney   History  Substance Use Topics  . Smoking status: Former Games developer  . Smokeless tobacco: Not on file  . Alcohol Use: Yes   OB History    No data available     Review of Systems  Constitutional: Positive for appetite change. Negative for fever, chills, diaphoresis and fatigue.  HENT: Positive for congestion and sore throat. Negative for ear pain, trouble swallowing and voice change.   Respiratory: Positive for cough ( dry) and wheezing. Negative for shortness of breath and stridor.   Cardiovascular: Negative for chest pain and palpitations.  Gastrointestinal: Negative for nausea, vomiting, abdominal pain and diarrhea.  Musculoskeletal: Negative for myalgias, back pain and arthralgias.    Allergies  Review of patient's  allergies indicates not on file.  Home Medications   Prior to Admission medications   Medication Sig Start Date End Date Taking? Authorizing Provider  benzonatate (TESSALON) 100 MG capsule Take 1 capsule (100 mg total) by mouth every 8 (eight) hours. 04/01/15   Junius Finner, PA-C  lisinopril-hydrochlorothiazide (PRINZIDE,ZESTORETIC) 20-25 MG per tablet Take 1 tablet by mouth daily. 11/24/14   Monica Becton, MD  sodium chloride (OCEAN) 0.65 % SOLN nasal spray Place 1 spray into both nostrils as needed for congestion. 04/01/15   Junius Finner, PA-C   BP 128/73 mmHg  Pulse 76  Temp(Src) 98 F (36.7 C) (Oral)  Ht  (1.6 m)  Wt 238 lb (107.956 kg)  BMI 42.17 kg/m2  SpO2 96%  LMP 09/29/2011 Physical Exam  Constitutional: She appears well-developed and well-nourished. No distress.  HENT:  Head: Normocephalic and atraumatic.  Right Ear: Hearing, tympanic membrane, external ear and ear canal normal.  Left Ear: Hearing, tympanic membrane, external ear and ear canal normal.  Nose: Nose normal.  Mouth/Throat: Uvula is midline and mucous membranes are normal. Posterior oropharyngeal erythema present. No oropharyngeal exudate, posterior oropharyngeal edema or tonsillar abscesses.  Eyes: Conjunctivae are normal. No scleral icterus.  Neck: Normal range of motion. Neck supple.  Cardiovascular: Normal rate, regular rhythm and normal heart sounds.   Pulmonary/Chest: Effort normal and breath sounds normal. No respiratory distress. She has no wheezes. She has no rales. She exhibits no tenderness.  No respiratory distress, able to speak  in full sentences w/o difficulty. No wheeze, stridor or rhonchi. Lungs: CTAB  Abdominal: Soft. Bowel sounds are normal. She exhibits no distension and no mass. There is no tenderness. There is no rebound and no guarding.  Musculoskeletal: Normal range of motion.  Neurological: She is alert.  Skin: Skin is warm and dry. She is not diaphoretic.  Nursing note and  vitals reviewed.   ED Course  Procedures (including critical care time) Labs Review Labs Reviewed - No data to display  Imaging Review No results found.   MDM   1. Acute upper respiratory infection    Pt is a 49yo female c/o gradually worsening URI symptoms. Pt appears well, non-toxic, afebrile, NAD. Lungs: CTAB. No wheeze or rhonchi on exam.  Doubt pneumonia at this time due to short duration of symptoms and mild severity.  Will hold off on CXR at this time, tx symptomatically. Rx: ocean saline nasal spray and tessalon pearls for cough.  Advised pt to use acetaminophen and ibuprofen as needed for fever and pain. Encouraged rest and fluids. Return precautions provided. Pt verbalized understanding and agreement with tx plan.     Junius Finnerrin O'Malley, PA-C 04/01/15 1523

## 2015-04-14 ENCOUNTER — Ambulatory Visit (INDEPENDENT_AMBULATORY_CARE_PROVIDER_SITE_OTHER): Payer: BLUE CROSS/BLUE SHIELD | Admitting: Family Medicine

## 2015-04-14 ENCOUNTER — Encounter: Payer: Self-pay | Admitting: Family Medicine

## 2015-04-14 ENCOUNTER — Ambulatory Visit (INDEPENDENT_AMBULATORY_CARE_PROVIDER_SITE_OTHER): Payer: BLUE CROSS/BLUE SHIELD

## 2015-04-14 VITALS — BP 120/78 | HR 86 | Ht 63.0 in | Wt 234.0 lb

## 2015-04-14 DIAGNOSIS — R059 Cough, unspecified: Secondary | ICD-10-CM | POA: Insufficient documentation

## 2015-04-14 DIAGNOSIS — K8020 Calculus of gallbladder without cholecystitis without obstruction: Secondary | ICD-10-CM

## 2015-04-14 DIAGNOSIS — R05 Cough: Secondary | ICD-10-CM

## 2015-04-14 MED ORDER — PREDNISONE 10 MG PO TABS: 30.0000 mg | ORAL_TABLET | Freq: Every day | ORAL | Status: DC

## 2015-04-14 MED ORDER — IPRATROPIUM BROMIDE 0.06 % NA SOLN: 2.0000 | Freq: Four times a day (QID) | NASAL | Status: DC

## 2015-04-14 MED ORDER — GUAIFENESIN-CODEINE 100-10 MG/5ML PO SOLN: 5.0000 mL | Freq: Every evening | ORAL | Status: DC | PRN

## 2015-04-14 NOTE — Assessment & Plan Note (Signed)
Likely bronchitis. Trial of prednisone Atrovent nasal spray and codeine cough syrup. Chest x-ray pending. We'll call in antibiotics as needed.

## 2015-04-14 NOTE — Patient Instructions (Signed)
Thank you for coming in today. Take prednisone, use cough syrup at bedtime, and use nasal spray. I will call you with x-ray results. Call or go to the emergency room if you get worse, have trouble breathing, have chest pains, or palpitations.   Acute Bronchitis Bronchitis is inflammation of the airways that extend from the windpipe into the lungs (bronchi). The inflammation often causes mucus to develop. This leads to a cough, which is the most common symptom of bronchitis.  In acute bronchitis, the condition usually develops suddenly and goes away over time, usually in a couple weeks. Smoking, allergies, and asthma can make bronchitis worse. Repeated episodes of bronchitis may cause further lung problems.  CAUSES Acute bronchitis is most often caused by the same virus that causes a cold. The virus can spread from person to person (contagious) through coughing, sneezing, and touching contaminated objects. SIGNS AND SYMPTOMS   Cough.   Fever.   Coughing up mucus.   Body aches.   Chest congestion.   Chills.   Shortness of breath.   Sore throat.  DIAGNOSIS  Acute bronchitis is usually diagnosed through a physical exam. Your health care provider will also ask you questions about your medical history. Tests, such as chest X-rays, are sometimes done to rule out other conditions.  TREATMENT  Acute bronchitis usually goes away in a couple weeks. Oftentimes, no medical treatment is necessary. Medicines are sometimes given for relief of fever or cough. Antibiotic medicines are usually not needed but may be prescribed in certain situations. In some cases, an inhaler may be recommended to help reduce shortness of breath and control the cough. A cool mist vaporizer may also be used to help thin bronchial secretions and make it easier to clear the chest.  HOME CARE INSTRUCTIONS  Get plenty of rest.   Drink enough fluids to keep your urine clear or pale yellow (unless you have a medical  condition that requires fluid restriction). Increasing fluids may help thin your respiratory secretions (sputum) and reduce chest congestion, and it will prevent dehydration.   Take medicines only as directed by your health care provider.  If you were prescribed an antibiotic medicine, finish it all even if you start to feel better.  Avoid smoking and secondhand smoke. Exposure to cigarette smoke or irritating chemicals will make bronchitis worse. If you are a smoker, consider using nicotine gum or skin patches to help control withdrawal symptoms. Quitting smoking will help your lungs heal faster.   Reduce the chances of another bout of acute bronchitis by washing your hands frequently, avoiding people with cold symptoms, and trying not to touch your hands to your mouth, nose, or eyes.   Keep all follow-up visits as directed by your health care provider.  SEEK MEDICAL CARE IF: Your symptoms do not improve after 1 week of treatment.  SEEK IMMEDIATE MEDICAL CARE IF:  You develop an increased fever or chills.   You have chest pain.   You have severe shortness of breath.  You have bloody sputum.   You develop dehydration.  You faint or repeatedly feel like you are going to pass out.  You develop repeated vomiting.  You develop a severe headache. MAKE SURE YOU:   Understand these instructions.  Will watch your condition.  Will get help right away if you are not doing well or get worse. Document Released: 10/20/2004 Document Revised: 01/27/2014 Document Reviewed: 03/05/2013 [facility] Patient Information 2015 [employer], Maryland[employer]. This information is not intended to replace advice  given to you by your health care provider. Make sure you discuss any questions you have with your health care provider.  

## 2015-04-14 NOTE — Progress Notes (Signed)
Wanda Valencia is a 49 y.o. female [facility]. Patient was seen in urgent care on July 6 for a cough and sore throat. She was diagnosed with a viral URI and give him symptomatic management with nasal spray and Tessalon. She notes the cough is persistent interfering with sleep and mildly productive. She also notes mild wheezing. She denies any personal history of asthma or smoking. No chest pains palpitations or shortness of breath. No vomiting diarrhea or fever. She feels well otherwise. She has an upcoming knee replacement surgery in August that she would like to have his cough resolved for. She notes a postnasal drip as well.   Past Medical History  Diagnosis Date  . Incontinence   . Retina disorder   . Hypertension   . Overactive bladder    Past Surgical History  Procedure Laterality Date  . Cesarean section    . Tubal ligation    . Kidney stone surgery    . Tubal ligation  10/14/2011   History  Substance Use Topics  . Smoking status: Former Games developermoker  . Smokeless tobacco: Not on file  . Alcohol Use: Yes   ROS as above Medications: Current Outpatient Prescriptions  Medication Sig Dispense Refill  . Aspirin (ASPIR-81 PO) Take by mouth.    . CRANBERRY PO Take by mouth.    . FIBER PO Take by mouth.    Marland Kitchen. lisinopril-hydrochlorothiazide (PRINZIDE,ZESTORETIC) 20-25 MG per tablet Take 1 tablet by mouth daily. 90 tablet 1  . ranitidine (ZANTAC) 150 MG tablet Take 150 mg by mouth 2 (two) times daily.    . sodium chloride (OCEAN) 0.65 % SOLN nasal spray Place 1 spray into both nostrils as needed for congestion. 1 Bottle 0  . guaiFENesin-codeine 100-10 MG/5ML syrup Take 5 mLs by mouth at bedtime as needed for cough. 120 mL 0  . ipratropium (ATROVENT) 0.06 % nasal spray Place 2 sprays into both nostrils 4 (four) times daily. 15 mL 1  . predniSONE (DELTASONE) 10 MG tablet Take 3 tablets (30 mg total) by mouth daily. 15 tablet 0    No current facility-administered medications for this visit.   Not on File   Exam:  BP 120/78 mmHg  Pulse 86  Ht 5\' 3"  (1.6 m)  Wt 234 lb (106.142 kg)  BMI 41.46 kg/m2  LMP 09/29/2011 Gen: Well NAD HEENT: EOMI,  MMM posterior pharynx is difficult to visualize. Normal tympanic membranes bilaterally. Clear nasal discharge.  Lungs: Normal work of breathing. Crackles left lung Heart: RRR no MRG Abd: NABS, Soft. Nondistended, Nontender Exts: Brisk capillary refill, warm and well perfused.   No results found for this or any previous visit (from the past 24 hour(s)). No results found.   Please see individual assessment and plan sections.

## 2015-04-14 NOTE — Progress Notes (Signed)
Quick Note:  Normal, no changes. No pnuemonia ______

## 2015-04-23 ENCOUNTER — Other Ambulatory Visit: Payer: Self-pay | Admitting: Orthopedic Surgery

## 2015-04-27 NOTE — Pre-Procedure Instructions (Signed)
    Wanda Valencia  04/27/2015      [id number] - [place], [place] - [address] [place] [address] Phone: [phone] Fax: [phone]    Your procedure is scheduled on 05-08-2015     Friday    Report to [facility] Admitting at 8:00  A.M.   Call this number if you have problems the morning of surgery:  [phone]   Remember:  Do not eat food or drink liquids after midnight.   Take these medicines the morning of surgery with A SIP OF WATER Atrovent nasal spray if needed,ranitidine(Zantac)   Do not wear jewelry, make-up or nail polish. No watches or rings  Do not wear lotions, powders, or perfumes.    Do not shave 48 hours prior to surgery. .   Do not bring valuables to the hospital.  [facility] is not responsible for any belongings or valuables.  Contacts, dentures or bridgework may not be worn into surgery.  Leave your suitcase in the car.  After surgery it may be brought to your room.  For patients admitted to the hospital, discharge time will be determined by your treatment team.  Patients discharged the day of surgery will not be allowed to drive home.    Special instructions:  See attached sheet  "Preparing for Surgery" for instructions on CHG shower  Please read over the following fact sheets that you were given. Pain Booklet, Coughing and Deep Breathing, Blood Transfusion Information and Surgical Site Infection Prevention

## 2015-04-28 ENCOUNTER — Encounter (HOSPITAL_COMMUNITY): Payer: Self-pay

## 2015-04-28 ENCOUNTER — Encounter (HOSPITAL_COMMUNITY)
Admission: RE | Admit: 2015-04-28 | Discharge: 2015-04-28 | Disposition: A | Discharge status: Home / Self Care | Payer: BLUE CROSS/BLUE SHIELD | Source: Ambulatory Visit | Attending: Orthopedic Surgery | Admitting: Orthopedic Surgery

## 2015-04-28 DIAGNOSIS — Z01812 Encounter for preprocedural laboratory examination: Secondary | ICD-10-CM | POA: Diagnosis present

## 2015-04-28 DIAGNOSIS — Z0183 Encounter for blood typing: Secondary | ICD-10-CM | POA: Diagnosis not present

## 2015-04-28 DIAGNOSIS — M179 Osteoarthritis of knee, unspecified: Secondary | ICD-10-CM | POA: Diagnosis not present

## 2015-04-28 DIAGNOSIS — Z01810 Encounter for preprocedural cardiovascular examination: Secondary | ICD-10-CM | POA: Insufficient documentation

## 2015-04-28 HISTORY — DX: Personal history of urinary (tract) infections: Z87.440

## 2015-04-28 HISTORY — DX: Unspecified osteoarthritis, unspecified site: M19.90

## 2015-04-28 HISTORY — DX: Calculus of kidney: N20.0

## 2015-04-28 LAB — COMPREHENSIVE METABOLIC PANEL
ALK PHOS: 68 U/L (ref 38–126)
ALT: 38 U/L (ref 14–54)
ANION GAP: 9 (ref 5–15)
AST: 30 U/L (ref 15–41)
Albumin: 4.1 g/dL (ref 3.5–5.0)
BUN: 16 mg/dL (ref 6–20)
CHLORIDE: 105 mmol/L (ref 101–111)
CO2: 25 mmol/L (ref 22–32)
Calcium: 9.4 mg/dL (ref 8.9–10.3)
Creatinine, Ser: 0.96 mg/dL (ref 0.44–1.00)
Glucose, Bld: 104 mg/dL — ABNORMAL HIGH (ref 65–99)
POTASSIUM: 3.9 mmol/L (ref 3.5–5.1)
Sodium: 139 mmol/L (ref 135–145)
Total Bilirubin: 0.7 mg/dL (ref 0.3–1.2)
Total Protein: 6.3 g/dL — ABNORMAL LOW (ref 6.5–8.1)

## 2015-04-28 LAB — SURGICAL PCR SCREEN
MRSA, PCR: NEGATIVE
Staphylococcus aureus: NEGATIVE

## 2015-04-28 LAB — CBC WITH DIFFERENTIAL/PLATELET
BASOS PCT: 0 % (ref 0–1)
Basophils Absolute: 0 10*3/uL (ref 0.0–0.1)
EOS PCT: 4 % (ref 0–5)
Eosinophils Absolute: 0.3 10*3/uL (ref 0.0–0.7)
HEMATOCRIT: 42.9 % (ref 36.0–46.0)
Hemoglobin: 14.4 g/dL (ref 12.0–15.0)
LYMPHS PCT: 22 % (ref 12–46)
Lymphs Abs: 1.6 10*3/uL (ref 0.7–4.0)
MCH: 31 pg (ref 26.0–34.0)
MCHC: 33.6 g/dL (ref 30.0–36.0)
MCV: 92.5 fL (ref 78.0–100.0)
Monocytes Absolute: 1 10*3/uL (ref 0.1–1.0)
Monocytes Relative: 13 % — ABNORMAL HIGH (ref 3–12)
NEUTROS ABS: 4.6 10*3/uL (ref 1.7–7.7)
Neutrophils Relative %: 61 % (ref 43–77)
Platelets: 272 10*3/uL (ref 150–400)
RBC: 4.64 MIL/uL (ref 3.87–5.11)
RDW: 13 % (ref 11.5–15.5)
WBC: 7.6 10*3/uL (ref 4.0–10.5)

## 2015-04-28 LAB — URINE MICROSCOPIC-ADD ON

## 2015-04-28 LAB — URINALYSIS, ROUTINE W REFLEX MICROSCOPIC
Bilirubin Urine: NEGATIVE
Glucose, UA: NEGATIVE mg/dL
Hgb urine dipstick: NEGATIVE
Ketones, ur: NEGATIVE mg/dL
NITRITE: NEGATIVE
PROTEIN: NEGATIVE mg/dL
SPECIFIC GRAVITY, URINE: 1.014 (ref 1.005–1.030)
Urobilinogen, UA: 0.2 mg/dL (ref 0.0–1.0)
pH: 6.5 (ref 5.0–8.0)

## 2015-04-28 LAB — PROTIME-INR
INR: 1.01 (ref 0.00–1.49)
PROTHROMBIN TIME: 13.5 s (ref 11.6–15.2)

## 2015-04-28 LAB — APTT: APTT: 23 s — AB (ref 24–37)

## 2015-05-07 MED ORDER — CHLORHEXIDINE GLUCONATE 4 % EX LIQD: 60.0000 mL | CUTANEOUS | Status: DC

## 2015-05-07 MED ORDER — CEFAZOLIN SODIUM-DEXTROSE 2-3 GM-% IV SOLR
2.0000 g | INTRAVENOUS | Status: AC
  Administered 2015-05-08: 2 g via INTRAVENOUS
  Filled 2015-05-07: qty 50

## 2015-05-07 NOTE — Progress Notes (Signed)
Pt notified of time change-new arrival of 0715

## 2015-05-08 ENCOUNTER — Encounter (HOSPITAL_COMMUNITY)
Admission: RE | Disposition: A | Discharge status: Home Health | Payer: Self-pay | Source: Ambulatory Visit | Attending: Orthopedic Surgery

## 2015-05-08 ENCOUNTER — Inpatient Hospital Stay (HOSPITAL_COMMUNITY): Payer: BLUE CROSS/BLUE SHIELD | Admitting: Anesthesiology

## 2015-05-08 ENCOUNTER — Inpatient Hospital Stay (HOSPITAL_COMMUNITY)
Admission: RE | Admit: 2015-05-08 | Discharge: 2015-05-10 | DRG: 470 | Disposition: A | Discharge status: Home Health | Payer: BLUE CROSS/BLUE SHIELD | Source: Ambulatory Visit | Attending: Orthopedic Surgery | Admitting: Orthopedic Surgery

## 2015-05-08 ENCOUNTER — Encounter (HOSPITAL_COMMUNITY): Payer: Self-pay | Admitting: *Deleted

## 2015-05-08 DIAGNOSIS — K760 Fatty (change of) liver, not elsewhere classified: Secondary | ICD-10-CM | POA: Diagnosis present

## 2015-05-08 DIAGNOSIS — Z8249 Family history of ischemic heart disease and other diseases of the circulatory system: Secondary | ICD-10-CM | POA: Diagnosis not present

## 2015-05-08 DIAGNOSIS — Z79899 Other long term (current) drug therapy: Secondary | ICD-10-CM

## 2015-05-08 DIAGNOSIS — E785 Hyperlipidemia, unspecified: Secondary | ICD-10-CM | POA: Diagnosis present

## 2015-05-08 DIAGNOSIS — J42 Unspecified chronic bronchitis: Secondary | ICD-10-CM | POA: Diagnosis present

## 2015-05-08 DIAGNOSIS — K219 Gastro-esophageal reflux disease without esophagitis: Secondary | ICD-10-CM | POA: Diagnosis present

## 2015-05-08 DIAGNOSIS — M1711 Unilateral primary osteoarthritis, right knee: Secondary | ICD-10-CM

## 2015-05-08 DIAGNOSIS — Z7982 Long term (current) use of aspirin: Secondary | ICD-10-CM

## 2015-05-08 DIAGNOSIS — I10 Essential (primary) hypertension: Secondary | ICD-10-CM | POA: Diagnosis present

## 2015-05-08 DIAGNOSIS — Z6841 Body Mass Index (BMI) 40.0 and over, adult: Secondary | ICD-10-CM | POA: Diagnosis not present

## 2015-05-08 DIAGNOSIS — M25561 Pain in right knee: Secondary | ICD-10-CM | POA: Diagnosis present

## 2015-05-08 DIAGNOSIS — Z96651 Presence of right artificial knee joint: Secondary | ICD-10-CM | POA: Diagnosis present

## 2015-05-08 HISTORY — DX: Family history of other specified conditions: Z84.89

## 2015-05-08 HISTORY — PX: TOTAL KNEE ARTHROPLASTY: SHX125

## 2015-05-08 HISTORY — DX: Unspecified chronic bronchitis: J42

## 2015-05-08 HISTORY — DX: Gastro-esophageal reflux disease without esophagitis: K21.9

## 2015-05-08 SURGERY — ARTHROPLASTY, KNEE, TOTAL
Anesthesia: Spinal | Site: Knee | Laterality: Right

## 2015-05-08 MED ORDER — LIDOCAINE HCL (CARDIAC) 20 MG/ML IV SOLN
INTRAVENOUS | Status: DC | PRN
  Administered 2015-05-08: 30 mg via INTRAVENOUS

## 2015-05-08 MED ORDER — FENTANYL CITRATE (PF) 250 MCG/5ML IJ SOLN
INTRAMUSCULAR | Status: AC
  Filled 2015-05-08: qty 5

## 2015-05-08 MED ORDER — ACETAMINOPHEN 650 MG RE SUPP: 650.0000 mg | Freq: Four times a day (QID) | RECTAL | Status: DC | PRN

## 2015-05-08 MED ORDER — PROMETHAZINE HCL 25 MG/ML IJ SOLN
6.2500 mg | INTRAMUSCULAR | Status: DC | PRN
  Administered 2015-05-08: 6.25 mg via INTRAVENOUS

## 2015-05-08 MED ORDER — BUPIVACAINE HCL (PF) 0.5 % IJ SOLN
INTRAMUSCULAR | Status: AC
  Filled 2015-05-08: qty 20

## 2015-05-08 MED ORDER — HYDROCHLOROTHIAZIDE 25 MG PO TABS
25.0000 mg | ORAL_TABLET | Freq: Every day | ORAL | Status: DC
  Administered 2015-05-08 – 2015-05-10 (×3): 25 mg via ORAL
  Filled 2015-05-08 (×3): qty 1

## 2015-05-08 MED ORDER — METHOCARBAMOL 1000 MG/10ML IJ SOLN
500.0000 mg | Freq: Four times a day (QID) | INTRAVENOUS | Status: DC | PRN
  Filled 2015-05-08: qty 5

## 2015-05-08 MED ORDER — POLYETHYLENE GLYCOL 3350 17 G PO PACK: 17.0000 g | PACK | Freq: Every day | ORAL | Status: DC | PRN

## 2015-05-08 MED ORDER — OXYCODONE-ACETAMINOPHEN 5-325 MG PO TABS
1.0000 | ORAL_TABLET | ORAL | Status: DC | PRN
  Administered 2015-05-08 (×2): 2 via ORAL
  Administered 2015-05-09 (×3): 1 via ORAL
  Administered 2015-05-10: 2 via ORAL
  Filled 2015-05-08: qty 2
  Filled 2015-05-08 (×2): qty 1
  Filled 2015-05-08: qty 2
  Filled 2015-05-08: qty 1
  Filled 2015-05-08: qty 2

## 2015-05-08 MED ORDER — LACTATED RINGERS IV SOLN
INTRAVENOUS | Status: DC | PRN
  Administered 2015-05-08 (×2): via INTRAVENOUS

## 2015-05-08 MED ORDER — METHOCARBAMOL 500 MG PO TABS
ORAL_TABLET | ORAL | Status: AC
  Filled 2015-05-08: qty 1

## 2015-05-08 MED ORDER — KETOROLAC TROMETHAMINE 15 MG/ML IJ SOLN
15.0000 mg | Freq: Three times a day (TID) | INTRAMUSCULAR | Status: AC
  Administered 2015-05-08 – 2015-05-09 (×4): 15 mg via INTRAVENOUS
  Filled 2015-05-08 (×4): qty 1

## 2015-05-08 MED ORDER — HYDROMORPHONE HCL 1 MG/ML IJ SOLN
0.2500 mg | INTRAMUSCULAR | Status: DC | PRN
  Administered 2015-05-08 (×2): 0.5 mg via INTRAVENOUS

## 2015-05-08 MED ORDER — HYDROMORPHONE HCL 1 MG/ML IJ SOLN: 0.5000 mg | INTRAMUSCULAR | Status: DC | PRN

## 2015-05-08 MED ORDER — BISACODYL 5 MG PO TBEC
5.0000 mg | DELAYED_RELEASE_TABLET | Freq: Every day | ORAL | Status: DC | PRN
  Administered 2015-05-09: 5 mg via ORAL
  Filled 2015-05-08: qty 1

## 2015-05-08 MED ORDER — MIDAZOLAM HCL 5 MG/5ML IJ SOLN
INTRAMUSCULAR | Status: DC | PRN
  Administered 2015-05-08: 2 mg via INTRAVENOUS

## 2015-05-08 MED ORDER — PROMETHAZINE HCL 25 MG/ML IJ SOLN
INTRAMUSCULAR | Status: AC
  Filled 2015-05-08: qty 1

## 2015-05-08 MED ORDER — BUPIVACAINE LIPOSOME 1.3 % IJ SUSP
20.0000 mL | INTRAMUSCULAR | Status: AC
  Administered 2015-05-08: 20 mL
  Filled 2015-05-08: qty 20

## 2015-05-08 MED ORDER — CEFAZOLIN SODIUM-DEXTROSE 2-3 GM-% IV SOLR
2.0000 g | Freq: Four times a day (QID) | INTRAVENOUS | Status: AC
  Administered 2015-05-08 (×2): 2 g via INTRAVENOUS
  Filled 2015-05-08 (×4): qty 50

## 2015-05-08 MED ORDER — BUPIVACAINE IN DEXTROSE 0.75-8.25 % IT SOLN
INTRATHECAL | Status: DC | PRN
  Administered 2015-05-08: 15 mg via INTRATHECAL

## 2015-05-08 MED ORDER — ACETAMINOPHEN 325 MG PO TABS: 650.0000 mg | ORAL_TABLET | Freq: Four times a day (QID) | ORAL | Status: DC | PRN

## 2015-05-08 MED ORDER — ASPIRIN EC 325 MG PO TBEC
325.0000 mg | DELAYED_RELEASE_TABLET | Freq: Two times a day (BID) | ORAL | Status: DC
  Administered 2015-05-08 – 2015-05-10 (×4): 325 mg via ORAL
  Filled 2015-05-08 (×4): qty 1

## 2015-05-08 MED ORDER — FLEET ENEMA 7-19 GM/118ML RE ENEM: 1.0000 | ENEMA | Freq: Once | RECTAL | Status: DC | PRN

## 2015-05-08 MED ORDER — DOCUSATE SODIUM 100 MG PO CAPS
100.0000 mg | ORAL_CAPSULE | Freq: Two times a day (BID) | ORAL | Status: DC
  Administered 2015-05-08: 100 mg via ORAL
  Filled 2015-05-08 (×4): qty 1

## 2015-05-08 MED ORDER — LISINOPRIL 20 MG PO TABS
20.0000 mg | ORAL_TABLET | Freq: Every day | ORAL | Status: DC
  Administered 2015-05-08 – 2015-05-10 (×3): 20 mg via ORAL
  Filled 2015-05-08 (×3): qty 1

## 2015-05-08 MED ORDER — METHOCARBAMOL 750 MG PO TABS: 750.0000 mg | ORAL_TABLET | Freq: Three times a day (TID) | ORAL | Status: DC | PRN

## 2015-05-08 MED ORDER — ASPIRIN EC 325 MG PO TBEC: 325.0000 mg | DELAYED_RELEASE_TABLET | Freq: Two times a day (BID) | ORAL | Status: DC

## 2015-05-08 MED ORDER — BUPIVACAINE HCL (PF) 0.5 % IJ SOLN
INTRAMUSCULAR | Status: DC | PRN
  Administered 2015-05-08: 20 mL

## 2015-05-08 MED ORDER — PROPOFOL INFUSION 10 MG/ML OPTIME: INTRAVENOUS | Status: DC | PRN

## 2015-05-08 MED ORDER — METHOCARBAMOL 1000 MG/10ML IJ SOLN
500.0000 mg | INTRAVENOUS | Status: AC
  Administered 2015-05-08: 500 mg via INTRAVENOUS
  Filled 2015-05-08: qty 5

## 2015-05-08 MED ORDER — FENTANYL CITRATE (PF) 100 MCG/2ML IJ SOLN
INTRAMUSCULAR | Status: DC | PRN
  Administered 2015-05-08 (×2): 50 ug via INTRAVENOUS
  Administered 2015-05-08: 100 ug via INTRAVENOUS
  Administered 2015-05-08: 50 ug via INTRAVENOUS

## 2015-05-08 MED ORDER — ONDANSETRON HCL 4 MG PO TABS: 4.0000 mg | ORAL_TABLET | Freq: Four times a day (QID) | ORAL | Status: DC | PRN

## 2015-05-08 MED ORDER — SODIUM CHLORIDE 0.9 % IV SOLN
INTRAVENOUS | Status: DC
Start: 2015-05-08 — End: 2015-05-10
  Administered 2015-05-09: 05:00:00 via INTRAVENOUS

## 2015-05-08 MED ORDER — ZOLPIDEM TARTRATE 5 MG PO TABS: 5.0000 mg | ORAL_TABLET | Freq: Every evening | ORAL | Status: DC | PRN

## 2015-05-08 MED ORDER — PROPOFOL INFUSION 10 MG/ML OPTIME
INTRAVENOUS | Status: DC | PRN
  Administered 2015-05-08: 60 ug/kg/min via INTRAVENOUS

## 2015-05-08 MED ORDER — MIDAZOLAM HCL 2 MG/2ML IJ SOLN
INTRAMUSCULAR | Status: AC
  Filled 2015-05-08: qty 4

## 2015-05-08 MED ORDER — TRANEXAMIC ACID 1000 MG/10ML IV SOLN
1000.0000 mg | INTRAVENOUS | Status: AC
  Administered 2015-05-08: 1000 mg via INTRAVENOUS
  Filled 2015-05-08: qty 10

## 2015-05-08 MED ORDER — LISINOPRIL-HYDROCHLOROTHIAZIDE 20-25 MG PO TABS: 1.0000 | ORAL_TABLET | Freq: Every day | ORAL | Status: DC

## 2015-05-08 MED ORDER — METHOCARBAMOL 500 MG PO TABS
500.0000 mg | ORAL_TABLET | Freq: Four times a day (QID) | ORAL | Status: DC | PRN
Start: 2015-05-08 — End: 2015-05-10
  Administered 2015-05-09 – 2015-05-10 (×3): 500 mg via ORAL
  Filled 2015-05-08 (×3): qty 1

## 2015-05-08 MED ORDER — PROPOFOL 10 MG/ML IV BOLUS
INTRAVENOUS | Status: DC | PRN
  Administered 2015-05-08: 50 mg via INTRAVENOUS

## 2015-05-08 MED ORDER — HYDROMORPHONE HCL 1 MG/ML IJ SOLN
INTRAMUSCULAR | Status: AC
  Filled 2015-05-08: qty 1

## 2015-05-08 MED ORDER — OXYCODONE-ACETAMINOPHEN 5-325 MG PO TABS: 1.0000 | ORAL_TABLET | ORAL | Status: DC | PRN

## 2015-05-08 MED ORDER — FAMOTIDINE 20 MG PO TABS
20.0000 mg | ORAL_TABLET | Freq: Two times a day (BID) | ORAL | Status: DC
  Administered 2015-05-08 – 2015-05-10 (×5): 20 mg via ORAL
  Filled 2015-05-08 (×5): qty 1

## 2015-05-08 MED ORDER — MEPERIDINE HCL 25 MG/ML IJ SOLN: 6.2500 mg | INTRAMUSCULAR | Status: DC | PRN

## 2015-05-08 MED ORDER — ONDANSETRON HCL 4 MG/2ML IJ SOLN: 4.0000 mg | Freq: Four times a day (QID) | INTRAMUSCULAR | Status: DC | PRN

## 2015-05-08 MED ORDER — DIPHENHYDRAMINE HCL 12.5 MG/5ML PO ELIX: 12.5000 mg | ORAL_SOLUTION | ORAL | Status: DC | PRN

## 2015-05-08 MED ORDER — ONDANSETRON HCL 4 MG/2ML IJ SOLN
INTRAMUSCULAR | Status: DC | PRN
  Administered 2015-05-08: 4 mg via INTRAVENOUS

## 2015-05-08 MED ORDER — DEXAMETHASONE SODIUM PHOSPHATE 10 MG/ML IJ SOLN
10.0000 mg | Freq: Two times a day (BID) | INTRAMUSCULAR | Status: AC
  Administered 2015-05-08 (×2): 10 mg via INTRAVENOUS
  Filled 2015-05-08 (×2): qty 1

## 2015-05-08 MED ORDER — 0.9 % SODIUM CHLORIDE (POUR BTL) OPTIME
TOPICAL | Status: DC | PRN
  Administered 2015-05-08: 1000 mL

## 2015-05-08 MED ORDER — PROMETHAZINE HCL 25 MG/ML IJ SOLN: 12.5000 mg | Freq: Four times a day (QID) | INTRAMUSCULAR | Status: DC | PRN

## 2015-05-08 MED ORDER — MIDAZOLAM HCL 2 MG/2ML IJ SOLN: 0.5000 mg | Freq: Once | INTRAMUSCULAR | Status: DC | PRN

## 2015-05-08 MED ORDER — IPRATROPIUM BROMIDE 0.06 % NA SOLN
2.0000 | Freq: Four times a day (QID) | NASAL | Status: DC | PRN
  Filled 2015-05-08: qty 15

## 2015-05-08 MED ORDER — ALUM & MAG HYDROXIDE-SIMETH 200-200-20 MG/5ML PO SUSP: 30.0000 mL | ORAL | Status: DC | PRN

## 2015-05-08 MED ORDER — LACTATED RINGERS IV SOLN
INTRAVENOUS | Status: DC
  Administered 2015-05-08: 50 mL/h via INTRAVENOUS

## 2015-05-08 MED ORDER — PROPOFOL 10 MG/ML IV BOLUS
INTRAVENOUS | Status: AC
  Filled 2015-05-08: qty 20

## 2015-05-08 SURGICAL SUPPLY — 74 items
BANDAGE ESMARK 6X9 LF (GAUZE/BANDAGES/DRESSINGS) ×1 IMPLANT
BENZOIN TINCTURE PRP APPL 2/3 (GAUZE/BANDAGES/DRESSINGS) ×3 IMPLANT
BLADE SAGITTAL 25.0X1.19X90 (BLADE) ×2 IMPLANT
BLADE SAGITTAL 25.0X1.19X90MM (BLADE) ×1
BLADE SAW SAG 90X13X1.27 (BLADE) ×3 IMPLANT
BNDG ESMARK 6X9 LF (GAUZE/BANDAGES/DRESSINGS) ×3
BOWL SMART MIX CTS (DISPOSABLE) ×3 IMPLANT
CAP KNEE TOTAL 3 SIGMA ×3 IMPLANT
CEMENT HV SMART SET (Cement) ×6 IMPLANT
CLOSURE WOUND 1/2 X4 (GAUZE/BANDAGES/DRESSINGS) ×2
COVER SURGICAL LIGHT HANDLE (MISCELLANEOUS) ×3 IMPLANT
CUFF TOURNIQUET SINGLE 34IN LL (TOURNIQUET CUFF) ×3 IMPLANT
CUFF TOURNIQUET SINGLE 44IN (TOURNIQUET CUFF) IMPLANT
DRAPE EXTREMITY T 121X128X90 (DRAPE) ×3 IMPLANT
DRAPE IMP U-DRAPE 54X76 (DRAPES) ×3 IMPLANT
DRAPE INCISE IOBAN 66X45 STRL (DRAPES) ×3 IMPLANT
DRAPE U-SHAPE 47X51 STRL (DRAPES) ×3 IMPLANT
DRSG AQUACEL AG ADV 3.5X10 (GAUZE/BANDAGES/DRESSINGS) ×3 IMPLANT
DRSG MEPILEX BORDER 4X12 (GAUZE/BANDAGES/DRESSINGS) ×3 IMPLANT
DRSG MEPILEX BORDER 4X8 (GAUZE/BANDAGES/DRESSINGS) IMPLANT
DRSG PAD ABDOMINAL 8X10 ST (GAUZE/BANDAGES/DRESSINGS) ×3 IMPLANT
DURAPREP 26ML APPLICATOR (WOUND CARE) ×3 IMPLANT
ELECT REM PT RETURN 9FT ADLT (ELECTROSURGICAL) ×3
ELECTRODE REM PT RTRN 9FT ADLT (ELECTROSURGICAL) ×1 IMPLANT
EVACUATOR 1/8 PVC DRAIN (DRAIN) ×3 IMPLANT
FACESHIELD WRAPAROUND (MASK) ×3 IMPLANT
GAUZE SPONGE 4X4 12PLY STRL (GAUZE/BANDAGES/DRESSINGS) ×3 IMPLANT
GLOVE BIOGEL PI IND STRL 6.5 (GLOVE) ×2 IMPLANT
GLOVE BIOGEL PI IND STRL 7.5 (GLOVE) ×1 IMPLANT
GLOVE BIOGEL PI IND STRL 8 (GLOVE) ×2 IMPLANT
GLOVE BIOGEL PI INDICATOR 6.5 (GLOVE) ×4
GLOVE BIOGEL PI INDICATOR 7.5 (GLOVE) ×2
GLOVE BIOGEL PI INDICATOR 8 (GLOVE) ×4
GLOVE ECLIPSE 6.5 STRL STRAW (GLOVE) ×3 IMPLANT
GLOVE ECLIPSE 7.5 STRL STRAW (GLOVE) ×6 IMPLANT
GOWN STRL REUS W/ TWL LRG LVL3 (GOWN DISPOSABLE) ×1 IMPLANT
GOWN STRL REUS W/ TWL XL LVL3 (GOWN DISPOSABLE) ×2 IMPLANT
GOWN STRL REUS W/TWL LRG LVL3 (GOWN DISPOSABLE) ×2
GOWN STRL REUS W/TWL XL LVL3 (GOWN DISPOSABLE) ×4
HANDPIECE INTERPULSE COAX TIP (DISPOSABLE) ×2
HOOD PEEL AWAY FACE SHEILD DIS (HOOD) ×6 IMPLANT
IMMOBILIZER KNEE 20 (SOFTGOODS) IMPLANT
IMMOBILIZER KNEE 22 UNIV (SOFTGOODS) ×3 IMPLANT
KIT BASIN OR (CUSTOM PROCEDURE TRAY) ×3 IMPLANT
KIT ROOM TURNOVER OR (KITS) ×3 IMPLANT
MANIFOLD NEPTUNE II (INSTRUMENTS) ×3 IMPLANT
NEEDLE SPNL 22GX3.5 QUINCKE BK (NEEDLE) ×3 IMPLANT
NS IRRIG 1000ML POUR BTL (IV SOLUTION) ×3 IMPLANT
PACK TOTAL JOINT (CUSTOM PROCEDURE TRAY) ×3 IMPLANT
PACK UNIVERSAL I (CUSTOM PROCEDURE TRAY) ×3 IMPLANT
PAD ARMBOARD 7.5X6 YLW CONV (MISCELLANEOUS) ×6 IMPLANT
PAD CAST 4YDX4 CTTN HI CHSV (CAST SUPPLIES) ×1 IMPLANT
PADDING CAST ABS 6INX4YD NS (CAST SUPPLIES) ×2
PADDING CAST ABS COTTON 6X4 NS (CAST SUPPLIES) ×1 IMPLANT
PADDING CAST COTTON 4X4 STRL (CAST SUPPLIES) ×2
SET HNDPC FAN SPRY TIP SCT (DISPOSABLE) ×1 IMPLANT
SPONGE GAUZE 4X4 12PLY STER LF (GAUZE/BANDAGES/DRESSINGS) ×3 IMPLANT
STAPLER VISISTAT 35W (STAPLE) IMPLANT
STRIP CLOSURE SKIN 1/2X4 (GAUZE/BANDAGES/DRESSINGS) ×4 IMPLANT
SUCTION FRAZIER TIP 10 FR DISP (SUCTIONS) ×3 IMPLANT
SUT MNCRL AB 3-0 PS2 18 (SUTURE) IMPLANT
SUT VIC AB 0 CT1 27 (SUTURE) ×4
SUT VIC AB 0 CT1 27XBRD ANBCTR (SUTURE) ×2 IMPLANT
SUT VIC AB 0 CTB1 27 (SUTURE) ×6 IMPLANT
SUT VIC AB 1 CT1 27 (SUTURE) ×8
SUT VIC AB 1 CT1 27XBRD ANBCTR (SUTURE) ×4 IMPLANT
SUT VIC AB 2-0 CTB1 (SUTURE) ×12 IMPLANT
SYR 50ML LL SCALE MARK (SYRINGE) ×3 IMPLANT
TOWEL OR 17X24 6PK STRL BLUE (TOWEL DISPOSABLE) ×3 IMPLANT
TOWEL OR 17X26 10 PK STRL BLUE (TOWEL DISPOSABLE) ×3 IMPLANT
TRAY FOLEY CATH 16FRSI W/METER (SET/KITS/TRAYS/PACK) IMPLANT
UPCHARGE REV TRAY MBT KNEE ×3 IMPLANT
WRAP KNEE MAXI GEL POST OP (GAUZE/BANDAGES/DRESSINGS) ×3 IMPLANT
YANKAUER SUCT BULB TIP NO VENT (SUCTIONS) ×3 IMPLANT

## 2015-05-08 NOTE — Plan of Care (Signed)
Problem: Consults Goal: Diagnosis- Total Joint Replacement Primary Total Knee Right     

## 2015-05-08 NOTE — Discharge Instructions (Signed)

## 2015-05-08 NOTE — Brief Op Note (Signed)
05/08/2015  1:18 PM  PATIENT:  Wanda Valencia  49 y.o. female  PRE-OPERATIVE DIAGNOSIS:  RIGHT KNEE DEGENERATIVE JOINT DISEASE  POST-OPERATIVE DIAGNOSIS:  RIGHT KNEE DEGENERATIVE JOINT DISEASE  PROCEDURE:  Procedure(s): TOTAL KNEE ARTHROPLASTY (Right)  SURGEON:  Surgeon(s) and Role:    * Jodi Geralds, MD - Primary  PHYSICIAN ASSISTANT:   ASSISTANTS: bethune   ANESTHESIA:   spinal and general  EBL:  Total I/O In: 1550 [I.V.:1550] Out: 400 [Urine:200; Drains:100; Blood:100]  BLOOD ADMINISTERED:none  DRAINS: (1) Hemovact drain(s) in the r knee with  Suction Open   LOCAL MEDICATIONS USED:  OTHER experel   SPECIMEN:  No Specimen  DISPOSITION OF SPECIMEN:  N/A  COUNTS:  YES  TOURNIQUET:   Total Tourniquet Time Documented: Thigh (Right) - 57 minutes Total: Thigh (Right) - 57 minutes   DICTATION: .Other Dictation: Dictation Number [id number]  PLAN OF CARE: Admit to inpatient   PATIENT DISPOSITION:  PACU - hemodynamically stable.   Delay start of Pharmacological VTE agent (>24hrs) due to surgical blood loss or risk of bleeding: no

## 2015-05-08 NOTE — Anesthesia Postprocedure Evaluation (Signed)
  Anesthesia Post-op Note  Patient: Surveyor, mining  Procedure(s) Performed: Procedure(s): TOTAL KNEE ARTHROPLASTY (Right)  Patient Location: PACU  Anesthesia Type:Spinal  Level of Consciousness: awake, alert , oriented and patient cooperative  Airway and Oxygen Therapy: Patient Spontanous Breathing  Post-op Pain: none  Post-op Assessment: Post-op Vital signs reviewed, Patient's Cardiovascular Status Stable, Respiratory Function Stable, Patent Airway, No signs of Nausea or vomiting, Pain level controlled, No headache, No backache and Spinal receding         L Sensory Level: S1-Sole of foot, small toes R Sensory Level: S1-Sole of foot, small toes  Post-op Vital Signs: Reviewed and stable  Last Vitals:  Filed Vitals:   05/08/15 1249  BP: 119/75  Pulse: 61  Temp: 36.4 C  Resp: 20    Complications: No apparent anesthesia complications

## 2015-05-08 NOTE — Progress Notes (Signed)
Orthopedic Tech Progress Note Patient Details:  Wanda Valencia [birth year] [id number] On cpm at 7:10 pm Patient ID: Wanda Valencia, female   DOB: 01-08-1966, 49 y.o.   MRN: [id number]   Wanda Valencia 05/08/2015, 7:12 PM

## 2015-05-08 NOTE — H&P (Signed)
TOTAL KNEE ADMISSION H&P  Patient is being admitted for right total knee arthroplasty.  Subjective:  Chief Complaint:right knee pain.  HPI: Wanda Valencia, 49 y.o. female, has a history of pain and functional disability in the right knee due to arthritis and has failed non-surgical conservative treatments for greater than 12 weeks to includeNSAID's and/or analgesics, corticosteriod injections, viscosupplementation injections, flexibility and strengthening excercises, supervised PT with diminished ADL's post treatment, weight reduction as appropriate and activity modification.  Onset of symptoms was gradual, starting 5 years ago with gradually worsening course since that time. The patient noted no past surgery on the right knee(s).  Patient currently rates pain in the right knee(s) at 8 out of 10 with activity. Patient has night pain, worsening of pain with activity and weight bearing, pain that interferes with activities of daily living, pain with passive range of motion, crepitus and joint swelling.  Patient has evidence of subchondral cysts, periarticular osteophytes, joint subluxation and joint space narrowing by imaging studies. This patient has had Failure of all reasonable conservative care. There is no active infection.  Patient Active Problem List   Diagnosis Date Noted  . Cough 04/14/2015  . Viral wart on right thumb 12/09/2014  . Plantar fasciitis, left 08/25/2014  . Fatty liver disease, nonalcoholic 34/19/3790  . Cholelithiases 06/17/2014  . History of kidney stones 05/20/2014  . Seborrheic keratosis 07/09/2013  . Cutaneous skin tags 07/09/2013  . Hyperlipidemia LDL goal < 100 11/29/2012  . Annual physical exam 07/17/2012  . 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 Obesity 07/17/2012  . Essential hypertension, benign 07/17/2012  . Right first Metacarpophalangeal joint pain 04/30/2012   Past Medical History  Diagnosis Date  . Incontinence   . 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 Hypertension   . Overactive bladder   . Kidney stones   . Hx: UTI (urinary tract infection)   . Arthritis     Past Surgical History  Procedure Laterality Date  . Cesarean section    . Tubal ligation    . Kidney stone surgery    . Tubal ligation  10/14/2011    Prescriptions prior to admission  Medication Sig Dispense Refill Last Dose  . Aspirin (ASPIR-81 PO) Take by mouth.   Taking  . CRANBERRY PO Take 1 capsule by mouth.    Taking  . FIBER PO Take 2 capsules by mouth daily. Chewable   Taking  . ipratropium (ATROVENT) 0.06 % nasal spray Place 2 sprays into both nostrils 4 (four) times daily. (Patient taking differently: Place 2 sprays into both nostrils 4 (four) times daily as needed for rhinitis. ) 15 mL 1   . lisinopril-hydrochlorothiazide (PRINZIDE,ZESTORETIC) 20-25 MG per tablet Take 1 tablet by mouth daily. 90 tablet 1 Taking  . ranitidine (ZANTAC) 150 MG tablet Take 150 mg by mouth daily.    Taking   Allergies  Allergen Reactions  . Other     Pt does not do well with swallowing pills    Social History  Substance Use Topics  . Smoking status: Never Smoker   . Smokeless tobacco: Not on file  . Alcohol Use: Yes     Comment: rarely    Family History  Problem Relation Age of Onset  . Hypertension Mother   . Depression Mother   . Hypertension Father   . Cancer Father     tongue/kidney     ROS ROS: I have reviewed the patient's review of systems thoroughly and  there are no positive responses as relates to the HPI.  Objective:  Physical Exam  Vital signs in last 24 hours:   Well-developed well-nourished patient in no acute distress. Alert and oriented x3 HEENT:within normal limits Cardiac: Regular rate and rhythm Pulmonary: Lungs clear to auscultation Abdomen: Soft and nontender.  Normal active bowel sounds  Musculoskeletal: (right knee: Painful range of motion.  Trace effusion.  No instability.  Pain and grinding through range of  motion.  Range of motion 5-95. Labs: Recent Results (from the past 2160 hour(s))  POCT urinalysis dipstick (new)     Status: None   Collection Time: 02/26/15  4:02 PM  Result Value Ref Range   Color, UA yellow    Clarity, UA clear    Glucose, UA neg    Bilirubin, UA negative    Ketones, POC UA negative    Spec Grav, UA 1.025 1.005 - 1.03   Blood, UA negative    pH, UA 6.0 5 - 8   Protein Ur, POC negative    Urobilinogen, UA 0.2 0 - 1   Nitrite, UA Negative    Leukocytes, UA moderate (2+)   Urine culture     Status: None   Collection Time: 02/26/15  4:02 PM  Result Value Ref Range   Colony Count 50,000 COLONIES/ML    Organism ID, Bacteria Multiple bacterial morphotypes present, none    Organism ID, Bacteria predominant. Suggest appropriate recollection if     Organism ID, Bacteria clinically indicated.   Urine culture     Status: None   Collection Time: 03/20/15  5:19 PM  Result Value Ref Range   Colony Count 70,000 COLONIES/ML    Organism ID, Bacteria Multiple bacterial morphotypes present, none    Organism ID, Bacteria predominant. Suggest appropriate recollection if     Organism ID, Bacteria clinically indicated.   POCT urinalysis dipstick (new)     Status: Abnormal   Collection Time: 03/20/15  5:37 PM  Result Value Ref Range   Color, UA light yellow (A) yellow   Clarity, UA clear clear   Glucose, UA negative negative   Bilirubin, UA negative negative   Ketones, POC UA negative negative   Spec Grav, UA 1.025 1.005 - 1.03   Blood, UA negative negative   pH, UA 7.0 5 - 8   Protein Ur, POC negative negative   Urobilinogen, UA 0.2 0 - 1   Nitrite, UA Negative Negative   Leukocytes, UA Trace (A) Negative  POCT hemoglobin     Status: Abnormal   Collection Time: 03/27/15 11:45 AM  Result Value Ref Range   Hemoglobin 12.5 12.2 - 16.2 g/dL  Urinalysis Dipstick     Status: Normal   Collection Time: 03/27/15 12:03 PM  Result Value Ref Range   Color, UA yellow    Clarity,  UA clear    Glucose, UA negative    Bilirubin, UA negative    Ketones, UA negative    Spec Grav, UA 1.025    Blood, UA negative    pH, UA 6.0    Protein, UA negative    Urobilinogen, UA 0.2    Nitrite, UA negative    Leukocytes, UA Trace (A) Negative  Urine Culture     Status: None   Collection Time: 03/27/15 12:03 PM  Result Value Ref Range   Colony Count NO GROWTH    Organism ID, Bacteria NO GROWTH   POC Hemoccult Bld/Stl (3-Cd Home Screen)     Status:  Normal   Collection Time: 04/01/15  2:46 PM  Result Value Ref Range   Card #1 Date 03/28/2015    Fecal Occult Blood, POC Negative Negative   Card #2 Date 03/29/2015    Card #2 Fecal Occult Blod, POC Negative    Card #3 Date 03/30/2015    Card #3 Fecal Occult Blood, POC Negative   Surgical pcr screen     Status: None   Collection Time: 04/28/15  2:07 PM  Result Value Ref Range   MRSA, PCR NEGATIVE NEGATIVE   Staphylococcus aureus NEGATIVE NEGATIVE    Comment:        The Xpert SA Assay (FDA approved for NASAL specimens in patients over 70 years of age), is one component of a comprehensive surveillance program.  Test performance has been validated by [facility] for patients greater than or equal to 49 year old. It is not intended to diagnose infection nor to guide or monitor treatment.   APTT     Status: Abnormal   Collection Time: 04/28/15  2:07 PM  Result Value Ref Range   aPTT 23 (L) 24 - 37 seconds  CBC WITH DIFFERENTIAL     Status: Abnormal   Collection Time: 04/28/15  2:07 PM  Result Value Ref Range   WBC 7.6 4.0 - 10.5 K/uL   RBC 4.64 3.87 - 5.11 MIL/uL   Hemoglobin 14.4 12.0 - 15.0 g/dL   HCT 42.9 36.0 - 46.0 %   MCV 92.5 78.0 - 100.0 fL   MCH 31.0 26.0 - 34.0 pg   MCHC 33.6 30.0 - 36.0 g/dL   RDW 13.0 11.5 - 15.5 %   Platelets 272 150 - 400 K/uL   Neutrophils Relative % 61 43 - 77 %   Neutro Abs 4.6 1.7 - 7.7 K/uL   Lymphocytes Relative 22 12 - 46 %   Lymphs Abs 1.6 0.7 - 4.0 K/uL   Monocytes Relative  13 (H) 3 - 12 %   Monocytes Absolute 1.0 0.1 - 1.0 K/uL   Eosinophils Relative 4 0 - 5 %   Eosinophils Absolute 0.3 0.0 - 0.7 K/uL   Basophils Relative 0 0 - 1 %   Basophils Absolute 0.0 0.0 - 0.1 K/uL  Comprehensive metabolic panel     Status: Abnormal   Collection Time: 04/28/15  2:07 PM  Result Value Ref Range   Sodium 139 135 - 145 mmol/L   Potassium 3.9 3.5 - 5.1 mmol/L   Chloride 105 101 - 111 mmol/L   CO2 25 22 - 32 mmol/L   Glucose, Bld 104 (H) 65 - 99 mg/dL   BUN 16 6 - 20 mg/dL   Creatinine, Ser 0.96 0.44 - 1.00 mg/dL   Calcium 9.4 8.9 - 10.3 mg/dL   Total Protein 6.3 (L) 6.5 - 8.1 g/dL   Albumin 4.1 3.5 - 5.0 g/dL   AST 30 15 - 41 U/L   ALT 38 14 - 54 U/L   Alkaline Phosphatase 68 38 - 126 U/L   Total Bilirubin 0.7 0.3 - 1.2 mg/dL   GFR calc non Af Amer >60 >60 mL/min   GFR calc Af Amer >60 >60 mL/min    Comment: (NOTE) The eGFR has been calculated using the CKD EPI equation. This calculation has not been validated in all clinical situations. eGFR's persistently <60 mL/min signify possible Chronic Kidney Disease.    Anion gap 9 5 - 15  Protime-INR     Status: None   Collection Time:  04/28/15  2:07 PM  Result Value Ref Range   Prothrombin Time 13.5 11.6 - 15.2 seconds   INR 1.01 0.00 - 1.49  Type and screen     Status: None (Preliminary result)   Collection Time: 04/28/15  2:07 PM  Result Value Ref Range   ABO/RH(D) A POS    Antibody Screen POS    Sample Expiration 05/12/2015    DAT, IgG NEG    PT AG Type NEGATIVE FOR M ANTIGEN    Antibody Identification ANTI I    Unit Number [id number]    Blood Component Type RED CELLS,LR    Unit division 00    Status of Unit ALLOCATED    Transfusion Status OK TO TRANSFUSE    Crossmatch Result COMPATIBLE    Unit Number [id number]    Blood Component Type RED CELLS,LR    Unit division 00    Status of Unit ALLOCATED    Transfusion Status OK TO TRANSFUSE    Crossmatch Result COMPATIBLE   Urinalysis, Routine w  reflex microscopic (not at [facility])     Status: Abnormal   Collection Time: 04/28/15  2:08 PM  Result Value Ref Range   Color, Urine YELLOW YELLOW   APPearance CLEAR CLEAR   Specific Gravity, Urine 1.014 1.005 - 1.030   pH 6.5 5.0 - 8.0   Glucose, UA NEGATIVE NEGATIVE mg/dL   Hgb urine dipstick NEGATIVE NEGATIVE   Bilirubin Urine NEGATIVE NEGATIVE   Ketones, ur NEGATIVE NEGATIVE mg/dL   Protein, ur NEGATIVE NEGATIVE mg/dL   Urobilinogen, UA 0.2 0.0 - 1.0 mg/dL   Nitrite NEGATIVE NEGATIVE   Leukocytes, UA TRACE (A) NEGATIVE  Urine microscopic-add on     Status: None   Collection Time: 04/28/15  2:08 PM  Result Value Ref Range   Squamous Epithelial / LPF RARE RARE   WBC, UA 3-6 <3 WBC/hpf   Bacteria, UA RARE RARE     Estimated body mass index is 40.49 kg/(m^2) as calculated from the following:   Height as of 02/06/15: _0  (1.626 m).   Weight as of 02/26/15: 236 lb (107.049 kg).   Imaging Review Plain radiographs demonstrate severe degenerative joint disease of the right knee(s). The overall alignment ismild varus. The bone quality appears to be good for age and reported activity level.  Assessment/Plan:  End stage arthritis, right knee   The patient history, physical examination, clinical judgment of the provider and imaging studies are consistent with end stage degenerative joint disease of the right knee(s) and total knee arthroplasty is deemed medically necessary. The treatment options including medical management, injection therapy arthroscopy and arthroplasty were discussed at length. The risks and benefits of total knee arthroplasty were presented and reviewed. The risks due to aseptic loosening, infection, stiffness, patella tracking problems, thromboembolic complications and other imponderables were discussed. The patient acknowledged the explanation, agreed to proceed with the plan and consent was signed. Patient is being admitted for inpatient treatment for surgery, pain  control, PT, OT, prophylactic antibiotics, VTE prophylaxis, progressive ambulation and ADL's and discharge planning. The patient is planning to be discharged home with home health services

## 2015-05-08 NOTE — Anesthesia Procedure Notes (Addendum)
Procedure Name: MAC Date/Time: 05/08/2015 9:32 AM Performed by: Carmela Rima Pre-anesthesia Checklist: Timeout performed, Patient being monitored, Suction available, Emergency Drugs available and Patient identified Patient Re-evaluated:Patient Re-evaluated prior to inductionOxygen Delivery Method: Simple face mask Placement Confirmation: positive ETCO2   Spinal Patient location during procedure: OR Start time: 05/08/2015 9:19 AM End time: 05/08/2015 9:23 AM Staffing Anesthesiologist: Jairo Ben Preanesthetic Checklist Completed: patient identified, site marked, surgical consent, pre-op evaluation, timeout performed, IV checked, risks and benefits discussed and monitors and equipment checked Spinal Block Patient position: sitting Prep: Betadine, ChloraPrep and site prepped and draped Patient monitoring: heart rate, cardiac monitor, continuous pulse ox and blood pressure Approach: midline Location: L4-5 Injection technique: single-shot Needle Needle type: Quincke  Needle gauge: 25 G Needle length: 9 cm Additional Notes Pt identified in Operating room.  Monitors applied. Working IV access confirmed. Sterile prep, drape lumbar spine. 1% lido local L 4,5, and  #25ga Quincke into clear CSF 1st pass,  Bupivacaine with dextrose into clear CSF with asp CSF beginning and end of injection.  Patient asymptomatic, VSS, no heme aspirated, tolerated well.  Sandford Craze, MD

## 2015-05-08 NOTE — Progress Notes (Signed)
Orthopedic Tech Progress Note Patient Details:  Wanda Valencia [birth year] [id number]  CPM Right Knee CPM Right Knee: On Right Knee Flexion (Degrees): 60 Right Knee Extension (Degrees): 0 Additional Comments: trapeze bar patient helper Viewed order from doctor's order list; as ordered by Dr. Luiz Blare according to RN  [name], [name] 05/08/2015, 11:59 AM

## 2015-05-08 NOTE — Transfer of Care (Signed)
Immediate Anesthesia Transfer of Care Note  Patient: Wanda Valencia  Procedure(s) Performed: Procedure(s): TOTAL KNEE ARTHROPLASTY (Right)  Patient Location: PACU  Anesthesia Type:Spinal  Level of Consciousness: awake, alert  and oriented  Airway & Oxygen Therapy: Patient Spontanous Breathing and Patient connected to nasal cannula oxygen  Post-op Assessment: Report given to RN, Post -op Vital signs reviewed and stable and Patient moving all extremities X 4  Post vital signs: Reviewed and stable  Last Vitals:  Filed Vitals:   05/08/15 0718  BP: 118/71  Pulse: 79  Temp: 36.7 C  Resp: 18    Complications: No apparent anesthesia complications

## 2015-05-08 NOTE — Anesthesia Preprocedure Evaluation (Addendum)
Anesthesia Evaluation  Patient identified by MRN, date of birth, ID band Patient awake    Reviewed: Allergy & Precautions, NPO status , Patient's Chart, lab work & pertinent test results  History of Anesthesia Complications Negative for: history of anesthetic complications  Airway Mallampati: II  TM Distance: >3 FB Neck ROM: Full    Dental  (+) Teeth Intact, Dental Advidsory Given   Pulmonary neg pulmonary ROS,  breath sounds clear to auscultation        Cardiovascular hypertension, On Medications - anginaRhythm:Regular Rate:Normal     Neuro/Psych negative neurological ROS     GI/Hepatic Neg liver ROS, GERD-  Medicated and Controlled,  Endo/Other  Morbid obesity  Renal/GU negative Renal ROS     Musculoskeletal  (+) Arthritis -,   Abdominal (+) + obese,   Peds  Hematology negative hematology ROS (+)   Anesthesia Other Findings   Reproductive/Obstetrics                           Anesthesia Physical Anesthesia Plan  ASA: II  Anesthesia Plan: Spinal   Post-op Pain Management:    Induction:   Airway Management Planned: Natural Airway and Simple Face Mask  Additional Equipment:   Intra-op Plan:   Post-operative Plan:   Informed Consent: I have reviewed the patients History and Physical, chart, labs and discussed the procedure including the risks, benefits and alternatives for the proposed anesthesia with the patient or authorized representative who has indicated his/her understanding and acceptance.   Dental Advisory Given  Plan Discussed with: Anesthesiologist, CRNA and Surgeon  Anesthesia Plan Comments: (Plan routine monitors, SAB)       Anesthesia Quick Evaluation

## 2015-05-08 NOTE — Evaluation (Addendum)
Physical Therapy Evaluation Patient Details Name: Wanda Valencia MRN: [id number] DOB: 07/23/1966 Today's Date: 05/08/2015   History of Present Illness  Patient is a 49 y/o female s/p R TKA. PMH includes HTN, UTI.  Clinical Impression  Patient presents with pain, nausea and post surgical deficits RLE s/p R TKA. Nausea limiting mobility assessment. Tolerated standing with Min A for balance. Increased time to perform all mobility secondary to nausea and lethargy. Instructed pt in exercises. Encouraged OOB to chair later for dinner as tolerated. Pt plans to have her parents stay with her at d/c as her spouse and daughter work. Will follow acutely to maximize independence and mobility prior to return home.     Follow Up Recommendations Home health PT;Supervision/Assistance - 24 hour    Equipment Recommendations  None recommended by PT    Recommendations for Other Services       Precautions / Restrictions Precautions Precautions: Knee Precaution Booklet Issued: No Precaution Comments: Reviewed no pillow under knee. Restrictions Weight Bearing Restrictions: Yes RLE Weight Bearing: Weight bearing as tolerated  KI donned when out of bed or walking.     Mobility  Bed Mobility Overal bed mobility: Needs Assistance Bed Mobility: Supine to Sit;Sit to Supine     Supine to sit: Mod assist;HOB elevated Sit to supine: Min assist   General bed mobility comments: Cues for sequencing. Assist to bring RLE to EOB and to elevate trunk. Use of rails for support. Min A to bring RLE into bed. + nausea.  Transfers Overall transfer level: Needs assistance Equipment used: Rolling walker (2 wheeled) Transfers: Sit to/from Stand Sit to Stand: Min assist         General transfer comment: Min A to boost from EOB with cues for hand placement. + nausea.   Ambulation/Gait Ambulation/Gait assistance:  (not able.)              Stairs            Wheelchair Mobility    Modified Rankin  (Stroke Patients Only)       Balance Overall balance assessment: Needs assistance Sitting-balance support: Feet supported;No upper extremity supported Sitting balance-Leahy Scale: Fair     Standing balance support: During functional activity Standing balance-Leahy Scale: Poor Standing balance comment: Relient on RW for support.                              Pertinent Vitals/Pain Pain Assessment: No/denies pain Pain Score: 5  Pain Location: right knee Pain Descriptors / Indicators: Sore;Aching Pain Intervention(s): Monitored during session;Repositioned    Home Living Family/patient expects to be discharged to:: Private residence Living Arrangements: Spouse/significant other;Parent Available Help at Discharge: Family;Available 24 hours/day Type of Home: House Home Access: Stairs to enter Entrance Stairs-Rails: Right Entrance Stairs-Number of Steps: 3 Home Layout: One level Home Equipment: Walker - 2 wheels;Bedside commode      Prior Function Level of Independence: Independent         Comments: Planning her daughter's wedding.     Hand Dominance        Extremity/Trunk Assessment   Upper Extremity Assessment: Defer to OT evaluation           Lower Extremity Assessment: RLE deficits/detail RLE Deficits / Details: Limited AROM/strength secondary to pain and recent surgery.       Communication   Communication: No difficulties  Cognition Arousal/Alertness: Suspect due to medications;Lethargic Behavior During Therapy: [facility] for tasks assessed/performed Overall  Cognitive Status: Within Functional Limits for tasks assessed                      General Comments General comments (skin integrity, edema, etc.): Family present in room during session.    Exercises Total Joint Exercises Ankle Circles/Pumps: Both;10 reps;Supine Quad Sets: Both;10 reps;Supine      Assessment/Plan    PT Assessment Patient needs continued PT services  PT  Diagnosis Acute pain;Generalized weakness   PT Problem List Decreased strength;Pain;Decreased range of motion;Impaired sensation;Decreased activity tolerance;Decreased balance;Decreased mobility  PT Treatment Interventions Balance training;Gait training;Stair training;Functional mobility training;Therapeutic activities;Therapeutic exercise;Patient/family education   PT Goals (Current goals can be found in the Care Plan section) Acute Rehab PT Goals Patient Stated Goal: to continue planning my daughter's wedding PT Goal Formulation: With patient Time For Goal Achievement: 05/22/15 Potential to Achieve Goals: Good    Frequency 7X/week   Barriers to discharge        Co-evaluation               End of Session Equipment Utilized During Treatment: Gait belt Activity Tolerance: Other (comment);Patient limited by pain (nausea.) Patient left: in bed;with call bell/phone within reach;with bed alarm set;with family/visitor present Nurse Communication: Mobility status         Time: 1430-1500 PT Time Calculation (min) (ACUTE ONLY): 30 min   Charges:   PT Evaluation $Initial PT Evaluation Tier I: 1 Procedure PT Treatments $Therapeutic Activity: 8-22 mins   PT G Codes:        Lajuane Leatham A Aundreya Souffrant 05/08/2015, 3:06 PM  Mylo Red, PT, DPT [phone]

## 2015-05-08 NOTE — Progress Notes (Signed)
Utilization review completed. Bryar Rennie, RN, BSN. 

## 2015-05-09 LAB — CBC
HCT: 35 % — ABNORMAL LOW (ref 36.0–46.0)
Hemoglobin: 11.6 g/dL — ABNORMAL LOW (ref 12.0–15.0)
MCH: 30.9 pg (ref 26.0–34.0)
MCHC: 33.1 g/dL (ref 30.0–36.0)
MCV: 93.3 fL (ref 78.0–100.0)
PLATELETS: 261 10*3/uL (ref 150–400)
RBC: 3.75 MIL/uL — AB (ref 3.87–5.11)
RDW: 12.8 % (ref 11.5–15.5)
WBC: 8.6 10*3/uL (ref 4.0–10.5)

## 2015-05-09 LAB — BASIC METABOLIC PANEL
ANION GAP: 6 (ref 5–15)
BUN: 14 mg/dL (ref 6–20)
CHLORIDE: 102 mmol/L (ref 101–111)
CO2: 26 mmol/L (ref 22–32)
CREATININE: 0.94 mg/dL (ref 0.44–1.00)
Calcium: 8.5 mg/dL — ABNORMAL LOW (ref 8.9–10.3)
GFR calc non Af Amer: >60 mL/min (ref >60)
Glucose, Bld: 161 mg/dL — ABNORMAL HIGH (ref 65–99)
Potassium: 3.9 mmol/L (ref 3.5–5.1)
Sodium: 134 mmol/L — ABNORMAL LOW (ref 135–145)

## 2015-05-09 NOTE — Op Note (Signed)
NAMEJESSEL, Wanda Valencia               ACCOUNT NO.:  [id number]  MEDICAL RECORD NO.:  [id number]  LOCATION:  [id number]                        FACILITY:  [facility]  PHYSICIAN:  Harvie Junior, M.D.   DATE OF BIRTH:  1965-11-20  DATE OF PROCEDURE:  05/08/2015 DATE OF DISCHARGE:                              OPERATIVE REPORT   PREOPERATIVE DIAGNOSES: 1. End-stage degenerative joint disease, right knee with severe bone-     on-bone changes in the medial compartment. 2. Morbid obesity with BMI greater than 40.  POSTOPERATIVE DIAGNOSES: 1. End-stage degenerative joint disease, right knee with severe bone-     on-bone changes in the medial compartment. 2. Morbid obesity with BMI greater than 40.  PROCEDURE:  Right total knee replacement with a Sigma system, size 3 femur, size 3 MBT revision tray, 12.5 mm bearing, and a 35 mm all- polyethylene patella.  SURGEON:  Harvie Junior, M.D.  ASSISTANT:  Marshia Ly, P.A.  ANESTHESIA:  General.  BRIEF HISTORY:  Wanda Valencia is a 49 year old female with a long history of significant complaints of right knee pain.  She had been treated conservatively for a prolonged period of time.  After failure of all conservative care, she was taken to the operating room for right total knee replacement.  Preoperative imaging showed severe bone-on-bone changes in the medial compartment.  She had failed conservative care including activity modification, physical therapy, injection therapy, and time.  DESCRIPTION OF PROCEDURE:  The patient was taken to the operating room. After adequate anesthesia was obtained with general anesthetic, the patient was placed supine on the operating table.  Her right leg was prepped and draped in a sterile fashion.  Following this, the left leg was exsanguinated, blood pressure tourniquet inflated to 350 mmHg, and that was done via spinal anesthetic.  Right leg was prepped and draped in usual sterile fashion.  Following this, the  right leg was exsanguinated, blood pressure tourniquet inflated to 350 mmHg. Following this, midline incision was made.  The subcutaneous tissue was dissected down to the level of the extensor mechanism and a medial parapatellar arthrotomy was undertaken.  Once this was completed, the medial and lateral meniscus were removed, retropatellar fat pad, synovium in the anterior aspect of the femur, and anterior and posterior cruciates.  Once this was done, the attention was turned to the femur, when intramedullary pilot hole was drilled and the distal femur was resected with a 5-degree valgus inclination and then the femur was sized to a 3.  Anterior and posterior cuts were made, chamfers and box. Attention was turned to the tibia, it was cut perpendicular to the long axis.  It was drilled and keeled and sized to a size 3 and the trials were put in place.  This was cut with a 3 degree posterior slope for an MBT revision tray.  MBT trial was placed and rotationally aligned, 3 femur was placed and then a 12.5 mm bridging bearing was put in place. Excellent range of motion and stability were achieved.  Attention was turned to the patella where a paddle was placed and drill holes were made through the lugs and the patella was placed at this point.  Knee put through a range of motion.  Excellent stability was achieved.  At this point, all trial components were removed and the knee was copiously and thoroughly lavaged with pulsatile lavage irrigation and suctioned dry.  The final components were then cemented into place, size 3 MBT revision tray, size 3 femur, a 12.5 mm bridging bearing trial was placed and a 35 mm all-polyethylene patellar placed and held with a clamp. Once this was done, the cement allowed to harden completely and all excess bone cement was removed.  Tourniquet was then let down.  All bleeding was controlled with electrocautery.  40 mL of Exparel elbow, which was 20 mL of Exparel  mixed with 20 mL of Marcaine instilled into synovial reflection for postoperative pain control.  Tourniquet was let down.  All bleeding was controlled with electrocautery, and the trial poly was removed, the final poly was placed.  At this point, the knee was put through a range of motion.  Excellent stability and range of motion were achieved and the medial parapatellar arthrotomy was closed with a 1-Vicryl running over a medium Hemovac drain.  The skin was closed with 0 and 2-0 Vicryl and skin staples.  Sterile compressive dressing was applied.  The patient was taken to recovery room, she was noted to be in satisfactory condition.  Estimated blood loss for the procedure is minimal.     Harvie Junior, M.D.     Ranae Plumber  D:  05/08/2015  T:  05/09/2015  Job:  [id number]  cc:   Harvie Junior, M.D.

## 2015-05-09 NOTE — Progress Notes (Signed)
Physical Therapy Treatment Patient Details Name: Wanda Valencia MRN: [id number] DOB: 1966/03/31 Today's Date: 05/09/2015    History of Present Illness Patient is a 49 y/o female s/p R TKA. PMH includes HTN, UTI.    PT Comments    Patient with improved mobility today.  She was fatigued by end of session.  Pain under good control and oxygen saturation >94% on RA after walk, HR 85 bpm.  Patient may benefit from further skilled PT to improve mobility, reduce fall risk and incr functional independence.   Follow Up Recommendations  Home health PT;Supervision/Assistance - 24 hour     Equipment Recommendations  None recommended by PT    Recommendations for Other Services       Precautions / Restrictions Precautions Precautions: Knee Precaution Booklet Issued: No Precaution Comments: Reviewed no pillow under knee. Required Braces or Orthoses: Knee Immobilizer - Right Knee Immobilizer - Right: On when out of bed or walking Restrictions Weight Bearing Restrictions: No RLE Weight Bearing: Weight bearing as tolerated    Mobility  Bed Mobility Overal bed mobility: Needs Assistance Bed Mobility: Supine to Sit     Supine to sit: Supervision        Transfers Overall transfer level: Needs assistance Equipment used: Rolling walker (2 wheeled) Transfers: Sit to/from [employer] (assisted to commode) Sit to Stand: Min guard Stand pivot transfers: Min guard       General transfer comment: min cues for hand placement and Rt leg management  Ambulation/Gait Ambulation/Gait assistance: Min guard Ambulation Distance (Feet): 120 Feet Assistive device: Rolling walker (2 wheeled) Gait Pattern/deviations: Step-to pattern;Decreased step length - left;Decreased stance time - right;Decreased weight shift to right     General Gait Details: cues to place Rt heel flat on floor in stance phase   Stairs            Wheelchair Mobility    Modified Rankin (Stroke  Patients Only)       Balance Overall balance assessment: Needs assistance Sitting-balance support: No upper extremity supported;Feet supported Sitting balance-Leahy Scale: Good     Standing balance support: Bilateral upper extremity supported Standing balance-Leahy Scale: Fair                      Cognition Arousal/Alertness: Awake/alert Behavior During Therapy: WFL for tasks assessed/performed Overall Cognitive Status: Within Functional Limits for tasks assessed                      Exercises Total Joint Exercises Ankle Circles/Pumps: AROM;Both;10 reps;Seated Quad Sets: AROM;Both;10 reps;Supine Towel Squeeze: AROM;Both;10 reps;Supine Short Arc Quad: AAROM;Right;10 reps;Supine Heel Slides: AAROM;Right;10 reps;Supine Hip ABduction/ADduction: AAROM;Right;10 reps;Supine Straight Leg Raises: AAROM;Right;10 reps;Supine Long Arc Quad: AROM;Right;10 reps;Seated    General Comments General comments (skin integrity, edema, etc.): husband present during tx      Pertinent Vitals/Pain Pain Assessment: 0-10 Pain Score: 1  Pain Location: Rt knee Pain Descriptors / Indicators: Aching Pain Intervention(s): Limited activity within patient's tolerance;Monitored during session (encouraged ice application)    Home Living                      Prior Function            PT Goals (current goals can now be found in the care plan section) Acute Rehab PT Goals Patient Stated Goal: regain strength PT Goal Formulation: With patient/family Time For Goal Achievement: 05/22/15 Potential to Achieve Goals: Good Progress towards PT goals: Progressing  toward goals    Frequency  7X/week    PT Plan Current plan remains appropriate    Co-evaluation             End of Session Equipment Utilized During Treatment: Gait belt;Right knee immobilizer Activity Tolerance: Patient tolerated treatment well Patient left: in chair;with call bell/phone within reach;with  family/visitor present     Time: 1610-9604 PT Time Calculation (min) (ACUTE ONLY): 60 min  Charges:  $Gait Training: 8-22 mins $Therapeutic Exercise: 23-37 mins $Therapeutic Activity: 8-22 mins                    G CodesNestor Lewandowsky, [place] [phone]  Shakeya Kerkman 05/09/2015, 12:06 PM

## 2015-05-09 NOTE — Progress Notes (Signed)
PATIENT ID: Wanda Valencia  MRN: [id number]  DOB/AGE:  05/24/1966 / 49 y.o.  1 Day Post-Op Procedure(s) (LRB): TOTAL KNEE ARTHROPLASTY (Right)    PROGRESS NOTE Subjective: Patient is alert, oriented, no Nausea, no Vomiting, yes passing gas, no Bowel Movement. Taking PO well. Denies SOB, Chest or Calf Pain. Using Incentive Spirometer, PAS in place. Ambulate WBAT, CPM 0-40 Patient reports pain as mild  .    Objective: Vital signs in last 24 hours: Filed Vitals:   05/08/15 1249 05/08/15 2031 05/09/15 0026 05/09/15 0451  BP: 119/75 120/77 91/54 101/54  Pulse: 61 84 70 68  Temp: 97.6 F (36.4 C) 99.2 F (37.3 C) 98.4 F (36.9 C) 98.5 F (36.9 C)  TempSrc:  Oral Oral Oral  Resp: Height:      Weight:      SpO2: 100% 92% 94% 92%      Intake/Output from previous day: I/O last 3 completed shifts: In: 3010 [P.O.:360; I.V.:2650] Out: 2280 [Urine:1800; Drains:380; Blood:100]   Intake/Output this shift:     LABORATORY DATA:  Recent Labs  05/09/15 0457  WBC 8.6  HGB 11.6*  HCT 35.0*  PLT 261  NA 134*  K 3.9  CL 102  CO2 26  BUN 14  CREATININE 0.94  GLUCOSE 161*  CALCIUM 8.5*    Examination: Neurologically intact Neurovascular intact Sensation intact distally Intact pulses distally Dorsiflexion/Plantar flexion intact Incision: dressing C/D/I No cellulitis present Compartment soft} Blood and plasma separated in drain indicating minimal recent drainage, drain pulled without difficulty.  Assessment:   1 Day Post-Op Procedure(s) (LRB): TOTAL KNEE ARTHROPLASTY (Right) ADDITIONAL DIAGNOSIS: Expected Acute Blood Loss Anemia, Hypertension and Obesity  Plan: PT/OT WBAT, CPM 5/hrs day until ROM 0-90 degrees, then D/C CPM DVT Prophylaxis:  SCDx72hrs, ASA 325 mg BID x 2 weeks DISCHARGE PLAN: Home DISCHARGE NEEDS: HHPT, HHRN, CPM, Walker and 3-in-1 comode seat     Brita Jurgensen R 05/09/2015, 8:13 AM

## 2015-05-09 NOTE — Progress Notes (Addendum)
Physical Therapy Treatment Patient Details Name: Wanda Valencia MRN: [id number] DOB: [birth year] Today's Date: 05/09/2015    History of Present Illness Patient is a 49 y/o female s/p R TKA. PMH includes HTN, UTI.    PT Comments    Patient progressing well with mobility. Pain slightly incr this pm, just returned to bed with nursing, so completed therapeutic exercises only with patient.  She agreed to walk with nursing later today.  Applied CPM.  Will continue to follow patient while on this venue of care to progress mobility.   Follow Up Recommendations  Home health PT;Supervision/Assistance - 24 hour     Equipment Recommendations  None recommended by PT    Recommendations for Other Services       Precautions / Restrictions Precautions Precautions: Knee Precaution Booklet Issued: No Precaution Comments: Reviewed no pillow under knee. Required Braces or Orthoses: Knee Immobilizer - Right Knee Immobilizer - Right: On when out of bed or walking Restrictions Weight Bearing Restrictions: No RLE Weight Bearing: Weight bearing as tolerated    Mobility  Bed Mobility           Sit to supine:  (using buddy lift)      Transfers                 General transfer comment:  (supervision toilet transfer)  Ambulation/Gait                 Stairs            Wheelchair Mobility    Modified Rankin (Stroke Patients Only)       Balance                                    Cognition Arousal/Alertness: Awake/alert Behavior During Therapy: WFL for tasks assessed/performed Overall Cognitive Status: Within Functional Limits for tasks assessed                      Exercises Total Joint Exercises Ankle Circles/Pumps: AROM;Both;10 reps;Seated Quad Sets: AROM;Both;10 reps;Supine Towel Squeeze: AROM;Both;10 reps;Supine Short Arc Quad: AAROM;10 reps;Supine;Right Heel Slides: AAROM;10 reps;Supine;Right Hip ABduction/ADduction: AAROM;10  reps;Supine;Right Straight Leg Raises: AAROM;10 reps;Supine;Right Long [employer]: AAROM;10 reps;Seated;Right Goniometric ROM: 82 degrees flexion (supine)    General Comments        Pertinent Vitals/Pain Pain Assessment: 0-10 Pain Score: 4  Pain Location: Rt knee Pain Descriptors / Indicators: Aching;Sore;Tiring Pain Intervention(s): Limited activity within patient's tolerance;Monitored during session;Premedicated before session    Home Living                      Prior Function            PT Goals (current goals can now be found in the care plan section) Acute Rehab PT Goals Patient Stated Goal: get stronger PT Goal Formulation: With patient Time For Goal Achievement: 05/22/15 Potential to Achieve Goals: Good Progress towards PT goals: Progressing toward goals    Frequency  7X/week    PT Plan Current plan remains appropriate    Co-evaluation             End of Session   Activity Tolerance: Patient tolerated treatment well;Patient limited by fatigue Patient left: in bed;in CPM;with call bell/phone within reach;with SCD's reapplied;with family/visitor present     Time: 1450-1510 PT Time Calculation (min) (ACUTE ONLY): 20 min  Charges:  $Therapeutic Exercise: 8-22  mins                    G CodesStephanie Acre [place], [place] [phone]  [place] Festa 05/09/2015, 4:12 PM

## 2015-05-09 NOTE — Care Management Note (Signed)
Case Management Note  Patient Details  Name: Emanuela Runnion MRN: [id number] Date of Birth: 01/29/1966  Subjective/Objective:  49 y/o F s/p R TKA                 Action/Plan: received referral to assist pt with [facility] needs and DME   Expected Discharge Date:  05/10/15                Expected Discharge Plan:  [place]  In-House Referral:     Discharge planning Services  CM Consult  Post Acute Care Choice:  Home Health Choice offered to:     DME Arranged:    DME Agency:     HH Arranged:  PT [place]:  [place]  Status of Service:  Completed, signed off  Medicare Important Message Given:    Date Medicare IM Given:    Medicare IM give by:    Date Additional Medicare IM Given:    Additional Medicare Important Message give by:     If discussed at [place] of Stay Meetings, dates discussed:    Additional Comments: met with pt and husband to discuss discharge plan. She plans to return home with the support of her husband and parents who live next door. The RW, BSC, and CPM were delivered before the surgery. Spoke to [place] from [facility] and she stated that they received the referral for HHPT prior to the surgery. Pt agreed to use [facility] for HHPT.  Norina Buzzard, RN 05/09/2015, 1:34 PM

## 2015-05-10 LAB — CBC
HEMATOCRIT: 34.7 % — AB (ref 36.0–46.0)
Hemoglobin: 11.5 g/dL — ABNORMAL LOW (ref 12.0–15.0)
MCH: 31.5 pg (ref 26.0–34.0)
MCHC: 33.1 g/dL (ref 30.0–36.0)
MCV: 95.1 fL (ref 78.0–100.0)
Platelets: 298 10*3/uL (ref 150–400)
RBC: 3.65 MIL/uL — ABNORMAL LOW (ref 3.87–5.11)
RDW: 13.2 % (ref 11.5–15.5)
WBC: 13.2 10*3/uL — ABNORMAL HIGH (ref 4.0–10.5)

## 2015-05-10 NOTE — Progress Notes (Signed)
PATIENT ID: Wanda Valencia  MRN: [id number]  DOB/AGE:  [birth year] / 49 y.o.  2 Days Post-Op Procedure(s) (LRB): TOTAL KNEE ARTHROPLASTY (Right)    PROGRESS NOTE Subjective: Patient is alert, oriented, no Nausea, no Vomiting, yes passing gas, yes Bowel Movement. Taking PO well. Denies SOB, Chest or Calf Pain. Using Incentive Spirometer, PAS in place. Ambulate WBAT with pt walking 120 ft with therapy, CPM 0-60 Patient reports pain as 6 on 0-10 scale and 7 on 0-10 scale  .    Objective: Vital signs in last 24 hours: Filed Vitals:   05/09/15 0026 05/09/15 0451 05/09/15 1935 05/10/15 0525  BP: 91/54 101/54 94/52 110/61  Pulse: 70 68 79 71  Temp: 98.4 F (36.9 C) 98.5 F (36.9 C) 97.9 F (36.6 C) 98.8 F (37.1 C)  TempSrc: Oral Oral Oral Oral  Resp: Height:      Weight:      SpO2: 94% 92% 97% 97%      Intake/Output from previous day: I/O last 3 completed shifts: In: 1660 [P.O.:560; I.V.:1100] Out: 1360 [Urine:1200; Drains:160]   Intake/Output this shift:     LABORATORY DATA:  Recent Labs  05/09/15 0457 05/10/15 0800  WBC 8.6 13.2*  HGB 11.6* 11.5*  HCT 35.0* 34.7*  PLT 261 298  NA 134*  --   K 3.9  --   CL 102  --   CO2 26  --   BUN 14  --   CREATININE 0.94  --   GLUCOSE 161*  --   CALCIUM 8.5*  --     Examination: Neurologically intact Neurovascular intact Sensation intact distally Intact pulses distally Dorsiflexion/Plantar flexion intact Incision: dressing C/D/I No cellulitis present Compartment soft}  Assessment:   2 Days Post-Op Procedure(s) (LRB): TOTAL KNEE ARTHROPLASTY (Right) ADDITIONAL DIAGNOSIS: Expected Acute Blood Loss Anemia, Hypertension  Plan: PT/OT WBAT, CPM 5/hrs day until ROM 0-90 degrees, then D/C CPM DVT Prophylaxis:  SCDx72hrs, ASA 325 mg BID x 2 weeks DISCHARGE PLAN: Home DISCHARGE NEEDS: HHPT, HHRN, CPM, Walker and 3-in-1 comode seat     PHILLIPS, ERIC R 05/10/2015, 9:09 AM

## 2015-05-10 NOTE — Progress Notes (Signed)
Physical Therapy Treatment Patient Details Name: Wanda Valencia MRN: [id number] DOB: March 02, 1966 Today's Date: 05/10/2015    History of Present Illness Patient is a 49 y/o female s/p R TKA. PMH includes HTN, UTI.    PT Comments    Pt moving very well.  Completed stair training this session.  Patient safe to D/C from a mobility standpoint based on progression towards goals set on PT eval.    Follow Up Recommendations  Home health PT;Supervision/Assistance - 24 hour     Equipment Recommendations  None recommended by PT    Recommendations for Other Services       Precautions / Restrictions Precautions Precautions: Knee Precaution Comments: Reviewed no pillow under knee. Required Braces or Orthoses: Knee Immobilizer - Right Knee Immobilizer - Right: On when out of bed or walking Restrictions RLE Weight Bearing: Weight bearing as tolerated    Mobility  Bed Mobility Overal bed mobility: Modified Independent                Transfers Overall transfer level: Needs assistance Equipment used: Rolling walker (2 wheeled) Transfers: Sit to/from Stand Sit to Stand: Supervision         General transfer comment: supervision for safety  Ambulation/Gait Ambulation/Gait assistance: Min guard Ambulation Distance (Feet): 200 Feet Assistive device: Rolling walker (2 wheeled) Gait Pattern/deviations: Step-through pattern;Decreased stride length;Decreased stance time - right;Decreased step length - left     General Gait Details: cues for heel strike RLE, increased WBing RLE, & increased step length L   Stairs Stairs: Yes Stairs assistance: Min guard Stair Management: One rail Left;Step to pattern Number of Stairs: 3 General stair comments: cues for technique.   Wheelchair Mobility    Modified Rankin (Stroke Patients Only)       Balance                                    Cognition Arousal/Alertness: Awake/alert Behavior During Therapy: WFL for  tasks assessed/performed Overall Cognitive Status: Within Functional Limits for tasks assessed                      Exercises Total Joint Exercises Ankle Circles/Pumps: AROM;Both;10 reps Quad Sets: AROM;Both;10 reps Straight Leg Raises: AAROM;Right;10 reps    General Comments        Pertinent Vitals/Pain Pain Assessment: 0-10 Pain Score: 3  Pain Location: rt knee Pain Descriptors / Indicators: Discomfort Pain Intervention(s): Monitored during session;Repositioned;Ice applied    Home Living                      Prior Function            PT Goals (current goals can now be found in the care plan section) Acute Rehab PT Goals Patient Stated Goal: get stronger PT Goal Formulation: With patient Time For Goal Achievement: 05/22/15 Potential to Achieve Goals: Good Progress towards PT goals: Progressing toward goals    Frequency  7X/week    PT Plan Current plan remains appropriate    Co-evaluation             End of Session Equipment Utilized During Treatment: Right knee immobilizer Activity Tolerance: Patient tolerated treatment well Patient left: in bed;with call bell/phone within reach;with family/visitor present     Time: 4098-1191 PT Time Calculation (min) (ACUTE ONLY): 38 min  Charges:  $Gait Training: 8-22 mins $Therapeutic Exercise: 8-22 mins $Therapeutic  Activity: 8-22 mins                    G Codes:      Lara Mulch 05/10/2015, 1:15 PM   Verdell Face, [place] [phone] 05/10/2015

## 2015-05-10 NOTE — Discharge Summary (Signed)
Patient ID: Wanda Valencia MRN: [id number] DOB/AGE: 1965/10/29 49 y.o.  Admit date: 05/08/2015 Discharge date: 05/10/2015  Admission Diagnoses:  Principal Problem:   Primary osteoarthritis of right knee   Discharge Diagnoses:  Same  Past Medical History  Diagnosis Date  . Incontinence   . 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 Hypertension   . Overactive bladder   . Kidney stones   . Hx: UTI (urinary tract infection)   . Family history of adverse reaction to anesthesia     "my sister has reactions to about anything; anesthesia included"  . Chronic bronchitis     "born w/it; get it alot"  . GERD (gastroesophageal reflux disease)   . Arthritis     "right knee and thumb" (05/08/2015)    Surgeries: Procedure(s): TOTAL KNEE ARTHROPLASTY on 05/08/2015   Consultants:    Discharged Condition: Improved  Hospital Course: Vera Chambers is an 49 y.o. female who was admitted 05/08/2015 for operative treatment ofPrimary osteoarthritis of right knee. Patient has severe unremitting pain that affects sleep, daily activities, and work/hobbies. After pre-op clearance the patient was taken to the operating room on 05/08/2015 and underwent  Procedure(s): TOTAL KNEE ARTHROPLASTY.    Patient was given perioperative antibiotics: Anti-infectives    Start     Dose/Rate Route Frequency Ordered Stop   05/08/15 1530  ceFAZolin (ANCEF) IVPB 2 g/50 mL premix     2 g 100 mL/hr over 30 Minutes Intravenous Every 6 hours 05/08/15 1306 05/08/15 2356   05/08/15 0900  ceFAZolin (ANCEF) IVPB 2 g/50 mL premix     2 g 100 mL/hr over 30 Minutes Intravenous To ShortStay Surgical 05/07/15 1334 05/08/15 0929       Patient was given sequential compression devices, early ambulation, and chemoprophylaxis to prevent DVT.  Patient benefited maximally from hospital stay and there were no complications.    Recent vital signs: Patient Vitals for the past 24 hrs:  BP Temp Temp src Pulse Resp SpO2   05/10/15 0525 110/61 mmHg 98.8 F (37.1 C) Oral 71 20 97 %  05/09/15 1935 (!) 94/52 mmHg 97.9 F (36.6 C) Oral 79 20 97 %     Recent laboratory studies:  Recent Labs  05/09/15 0457 05/10/15 0800  WBC 8.6 13.2*  HGB 11.6* 11.5*  HCT 35.0* 34.7*  PLT 261 298  NA 134*  --   K 3.9  --   CL 102  --   CO2 26  --   BUN 14  --   CREATININE 0.94  --   GLUCOSE 161*  --   CALCIUM 8.5*  --      Discharge Medications:     Medication List    STOP taking these medications        ASPIR-81 PO  Replaced by:  aspirin EC 325 MG tablet      TAKE these medications        aspirin EC 325 MG tablet  Take 1 tablet (325 mg total) by mouth 2 (two) times daily after a meal. Take x 1 month post op to decrease risk of blood clots.     CRANBERRY PO  Take 1 capsule by mouth.     FIBER PO  Take 2 capsules by mouth daily. Chewable     ipratropium 0.06 % nasal spray  Commonly known as:  ATROVENT  Place 2 sprays into both nostrils 4 (four) times daily.     lisinopril-hydrochlorothiazide 20-25 MG per tablet  Commonly known as:  PRINZIDE,ZESTORETIC  Take 1 tablet by mouth daily.     methocarbamol 750 MG tablet  Commonly known as:  ROBAXIN-750  Take 1 tablet (750 mg total) by mouth every 8 (eight) hours as needed for muscle spasms.     oxyCODONE-acetaminophen 5-325 MG per tablet  Commonly known as:  PERCOCET/ROXICET  Take 1-2 tablets by mouth every 4 (four) hours as needed for severe pain.     ranitidine 150 MG tablet  Commonly known as:  ZANTAC  Take 150 mg by mouth daily.        Diagnostic Studies: Dg Chest 2 View  04/14/2015   CLINICAL DATA:  Cough for 2-1/2 weeks.  EXAM: CHEST  2 VIEW  COMPARISON:  None.  FINDINGS: The heart size and mediastinal contours are within normal limits. Both lungs are clear. The visualized skeletal structures are unremarkable.  IMPRESSION: No active cardiopulmonary disease.   Electronically Signed   By: Elige Ko   On: 04/14/2015 12:00     Disposition: 01-Home or Self Care      Discharge Instructions    CPM    Complete by:  As directed   Continuous passive motion machine (CPM):      Use the CPM from 0 to 60  for 5 hours per day.      You may increase by 10 degrees per day.  You may break it up into 2 or 3 sessions per day.      Use CPM for 2 weeks or until you are told to stop.     Call MD / Call 911    Complete by:  As directed   If you experience chest pain or shortness of breath, CALL 911 and be transported to the hospital emergency room.  If you develope a fever above 101 F, pus (white drainage) or increased drainage or redness at the wound, or calf pain, call your surgeon's office.     Change dressing    Complete by:  As directed   Change dressing on 5, then change the dressing daily with sterile 4 x 4 inch gauze dressing and apply TED hose.  You may clean the incision with alcohol prior to redressing.     Constipation Prevention    Complete by:  As directed   Drink plenty of fluids.  Prune juice may be helpful.  You may use a stool softener, such as Colace (over the counter) 100 mg twice a day.  Use MiraLax (over the counter) for constipation as needed.     Diet - low sodium heart healthy    Complete by:  As directed      Driving restrictions    Complete by:  As directed   No driving for 2 weeks     Increase activity slowly as tolerated    Complete by:  As directed      Patient may shower    Complete by:  As directed   You may shower without a dressing once there is no drainage.  Do not wash over the wound.  If drainage remains, cover wound with plastic wrap and then shower.     Weight bearing as tolerated    Complete by:  As directed   Laterality:  right  Extremity:  Lower           Follow-up Information    Follow up with GRAVES,JOHN L, MD. Schedule an appointment as soon as possible for a visit in 2 weeks.  Specialty:  Orthopedic Surgery   Contact information:   [address] [place]  [address] [phone]        Signed: Vear Clock Tarun Patchell R 05/10/2015, 9:11 AM

## 2015-05-11 ENCOUNTER — Encounter (HOSPITAL_COMMUNITY): Payer: Self-pay | Admitting: Orthopedic Surgery

## 2015-05-12 LAB — TYPE AND SCREEN
ABO/RH(D): A POS
Antibody Screen: POSITIVE
DAT, IGG: NEGATIVE
PT AG TYPE: NEGATIVE
UNIT DIVISION: 0
Unit division: 0

## 2015-05-28 ENCOUNTER — Other Ambulatory Visit: Payer: Self-pay | Admitting: Sports Medicine

## 2015-06-02 ENCOUNTER — Encounter: Payer: Self-pay | Admitting: Rehabilitative and Restorative Service Providers"

## 2015-06-02 ENCOUNTER — Ambulatory Visit (INDEPENDENT_AMBULATORY_CARE_PROVIDER_SITE_OTHER): Payer: BLUE CROSS/BLUE SHIELD | Admitting: Rehabilitative and Restorative Service Providers"

## 2015-06-02 DIAGNOSIS — Z7409 Other reduced mobility: Secondary | ICD-10-CM

## 2015-06-02 DIAGNOSIS — R269 Unspecified abnormalities of gait and mobility: Secondary | ICD-10-CM

## 2015-06-02 DIAGNOSIS — M25661 Stiffness of right knee, not elsewhere classified: Secondary | ICD-10-CM

## 2015-06-02 DIAGNOSIS — R531 Weakness: Secondary | ICD-10-CM

## 2015-06-02 DIAGNOSIS — M6289 Other specified disorders of muscle: Secondary | ICD-10-CM | POA: Diagnosis not present

## 2015-06-02 DIAGNOSIS — R29898 Other symptoms and signs involving the musculoskeletal system: Secondary | ICD-10-CM

## 2015-06-02 NOTE — Patient Instructions (Signed)
Quads / HF, Prone   Lie face down, knees together. Grasp one ankle with same-side hand. Use towel if needed to reach. Gently pull foot toward buttock. Hold _30__ seconds. Repeat 3_ times per session. Do _2-3_ sessions per day.   Gluteal Sets   Tighten buttocks while pressing pelvis to floor. Hold __10__ seconds. Repeat __10__ times per set. Do __10__ sets per session. Do _2-3___ sessions per day.   Lying on stomach Tighten muscles in back of leg; toe down; lift knee. Hold for 10 sec 10 reps   Strengthening: Quadriceps Set  Lying on back; heel supported on folded towel; knee unsupported; push knee straight. Tighten muscles on top of thighs by pushing knees down into surface. Hold _10- 20___ seconds. Repeat __10__ times per set. Do __2-3__ sessions per day.  HIP: Hamstrings - Supine   Place strap around foot. Raise leg up, keep knee straight. Can bend opposite knee.  Hold __30_ seconds. _3__ reps per set, _2-3__ sets per day   Bend knee as far as possible. Hold for 20-30 sec 3-5 reps 4-5 times/day. Can slide foot up along the bed Can hold thigh bent and let gravity bend your knee.

## 2015-06-02 NOTE — Therapy (Signed)
[facility] [place] [address] [place], [place], [address] Phone: [phone]   Fax:  [phone]  Physical Therapy Evaluation  Patient Details  Name: Wanda Valencia MRN: [id number] Date of Birth: 1966/07/24 Referring Provider:  Jodi Geralds, MD  Encounter Date: 06/02/2015      PT End of Session - 06/02/15 1224    Visit Number 1   Number of Visits 16   Date for PT Re-Evaluation 08/04/15   PT Start Time 1113   PT Stop Time 1221   PT Time Calculation (min) 68 min   Activity Tolerance Patient tolerated treatment well      Past Medical History  Diagnosis Date  . Incontinence   . 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 Hypertension   . Overactive bladder   . Kidney stones   . Hx: UTI (urinary tract infection)   . Family history of adverse reaction to anesthesia     "my sister has reactions to about anything; anesthesia included"  . Chronic bronchitis     "born w/it; get it alot"  . GERD (gastroesophageal reflux disease)   . Arthritis     "right knee and thumb" (05/08/2015)    Past Surgical History  Procedure Laterality Date  . Cesarean section  1989; 1991  . Cystoscopy w/ stone manipulation  09/2006  . Joint replacement    . 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 Tubal ligation  10/14/2011  . 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  Visit Diagnosis:  Stiffness of knee joint, right - Plan: PT plan of care cert/re-cert  Weakness of both hips - Plan: PT plan of care cert/re-cert  Abnormal gait - Plan: PT plan of care cert/re-cert  Decreased strength, endurance, and mobility - Plan: PT plan of care cert/re-cert      Subjective Assessment - 06/02/15 1109    Subjective History of Rt knee pain for the past 2-3 years; MRI  showed bone on bone. Elected to proceed with TKA 05/08/15   Pertinent History Multiple joint pain likely related to estrogen levels changing since 2013; some decreased peripherial vision   How long can you sit comfortably? ~1 hour   How long can you stand comfortably? ~10-15 min   How long can you walk comfortably? ~1 hour   Diagnostic tests MRI/XRAYS   Currently in Pain? No/denies   Pain Location Knee   Pain Orientation Right   Pain Descriptors / Indicators Tightness   Pain Type Acute pain   Pain Radiating Towards at night some tingling into calf   Pain Onset More than a month ago   Pain Relieving Factors ice; elevatioin; OTC meds            [facility] PT Assessment - 06/02/15 0001    Assessment   Medical Diagnosis Rt TKA   Onset Date/Surgical Date 05/08/15   Hand Dominance Right   Next MD Visit 06/16/15   Prior Therapy home health 3-weeks   Precautions   Precaution Comments no lifting over 10 pounds   Restrictions   Weight Bearing Restrictions No   Balance Screen   Has the patient fallen in the past 6 months No   Has the patient had a decrease in activity level because of a fear of falling?  No  Is the patient reluctant to leave their home because of a fear of falling?  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   Leisure household chores; cares for 81 year old grandson 2 days/wk - daughter's wedding 06/27/15   Observation/Other Assessments   Focus on Therapeutic Outcomes (FOTO)  49% limitation   Observation/Other Assessments-Edema    Edema --  Rt knee/calf/ankle edematous Rt compared to Lt    Sensation   Additional Comments lateral to mid incision line area of numbness    Posture/Postural Control   Posture Comments weight shifted to Lt in standing; Rt  iliac crest elevated compared to Lt    AROM   Overall AROM Comments bilat hip tightness - limited by obesity; tightness bilat hip extension    Right Knee Extension 0   Right Knee Flexion 114   Left Knee Extension -12   Left Knee Flexion 84   Strength   Right/Left Hip --  5/5 except hip extension 4/5 Rt; 4+/5 Lt   Right/Left Knee --  5/5 bilat   Flexibility   Soft Tissue Assessment /Muscle Length --  tightness through bilat hip flexors   Hamstrings Rt 82 deg ; Lt 93 deg   Quadriceps Rt 76 deg; 109 deg   ITB minimal tightness   Piriformis mild tightness Rt; WFL's Lt   Palpation   Patella mobility decreased    Ambulation/Gait   Gait Comments limp Rt LE w/weight bearing on Rt; hip and knee flexed in stance and swing phase                   [facility] Adult PT Treatment/Exercise - 06/02/15 0001    Knee/Hip Exercises: Stretches   Passive Hamstring Stretch 3 reps;30 seconds  with strap   Quad Stretch 3 reps;30 seconds  with strap   Knee: Self-Stretch to increase Flexion 3 reps;20 seconds  with strap   Knee: Self-Stretch Limitations sliding knee heel toward buttock and flexing thigh allowing knee to flex   Knee/Hip Exercises: Supine   Quad Sets Strengthening;Right;1 set;10 reps  10 sec hold   Knee/Hip Exercises: Prone   Other Prone Exercises glut set 10 sec hold 10 reps   Other Prone Exercises knee ext w/toe supported 10 sec hold 10 reps   Electrical Stimulation   Electrical Stimulation Location knee   Electrical Stimulation Action IFC   Electrical Stimulation Parameters to tolerance   Electrical Stimulation Goals Pain;Edema   Vasopneumatic   Number Minutes Vasopneumatic  15 minutes   Vasopnuematic Location  Knee   Vasopneumatic Pressure Medium   Vasopneumatic Temperature  3*   Manual Therapy   Manual therapy comments scar massage/instructed in scar massage for home.                 PT Education - 06/02/15 1215    Education provided Yes   Education  Details Stretching; HEP   Person(s) Educated Patient   Methods Explanation;Demonstration;Tactile cues;Verbal cues;Handout   Comprehension Verbalized understanding;Returned demonstration;Verbal cues required;Tactile cues required          PT Short Term Goals - 06/02/15 1237    PT SHORT TERM GOAL #1   Title Patient I in initial HEP 06/30/15   Time 4   Period Weeks  Status New   PT SHORT TERM GOAL #2   Title Increase knee Rt ROM to -2 deg ext - 105 deg flex 06/30/15   Time 4   Period Weeks   Status New   PT SHORT TERM GOAL #3   Title Increase bilat strength hip extension 1/2 muscle grade 06/30/15   Time 4   Period Weeks   Status New   PT SHORT TERM GOAL #4   Title Increase standing tolerance to 15-20 min10/04/16   Time 4   Period Weeks   Status New   PT SHORT TERM GOAL #5   Title Normal gait pattern without limp Rt LE 07/14/15   Time 6   Period Weeks   Status New           PT Long Term Goals - 06/02/15 1240    PT LONG TERM GOAL #1   Title Patient I in HEP for discharge 08/04/15   Time 8   Period Weeks   Status New   PT LONG TERM GOAL #2   Title ROM Rt knee +/> than ROM Lt knee 08/04/15   Time 8   Period Weeks   Status New   PT LONG TERM GOAL #3   Title 5/5 strength bilat hip extension 08/04/15   Time 8   Period Weeks   Status New   PT LONG TERM GOAL #4   Title Patient to tolerate 2-3 hours sitting in preparation for return to work 08/04/15   Time 8   Period Weeks   Status New   PT LONG TERM GOAL #5   Title Decrease FOTO to </=41% limitation 08/04/15   Time 8   Period Weeks   Status New               Plan - 06/02/15 1233    Clinical Impression Statement Patient presents S/p Rt TKA with limited ROM; decreased strength; abnormal gait pattern; edema; intermittent pain; decreased functional activity level; limited endurance. She will benefit form PT to address deficits identified and restore maximum functional level.    Pt will benefit from  skilled therapeutic intervention in order to improve on the following deficits Decreased range of motion;Decreased strength;Abnormal gait;Pain;Decreased activity tolerance;Decreased endurance;Obesity   Rehab Potential Good   PT Frequency 2x / week   PT Duration 8 weeks   PT Treatment/Interventions Patient/family education;ADLs/Self Care Home Management;Therapeutic exercise;Therapeutic activities;Scar mobilization;Dry needling;Cryotherapy;Electrical Stimulation;Moist Heat;Ultrasound;Manual techniques   PT Next Visit Plan review HEP; progress with ROM/strengthening   PT Home Exercise Plan scar massage; ROM/stretching; strengthening   Consulted and Agree with Plan of Care Patient         Problem List Patient Active Problem List   Diagnosis Date Noted  . Primary osteoarthritis of right knee 05/08/2015  . Cough 04/14/2015  . Viral wart on right thumb 12/09/2014  . Plantar fasciitis, left 08/25/2014  . Fatty liver disease, nonalcoholic 06/17/2014  . Cholelithiases 06/17/2014  . History of kidney stones 05/20/2014  . Seborrheic keratosis 07/09/2013  . Cutaneous skin tags 07/09/2013  . Hyperlipidemia LDL goal < 100 11/29/2012  . Annual physical exam 07/17/2012  . 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 Obesity 07/17/2012  . Essential hypertension, benign 07/17/2012  . Right first Metacarpophalangeal joint pain 04/30/2012    Kazuma Elena Rober Minion PT, MPH 06/02/2015, 12:47 PM  [facility] [address] [place], [place], [address] Phone: [phone]   Fax:  [phone]

## 2015-06-04 ENCOUNTER — Ambulatory Visit (INDEPENDENT_AMBULATORY_CARE_PROVIDER_SITE_OTHER): Payer: BLUE CROSS/BLUE SHIELD | Admitting: Physical Therapy

## 2015-06-04 DIAGNOSIS — Z7409 Other reduced mobility: Secondary | ICD-10-CM | POA: Diagnosis not present

## 2015-06-04 DIAGNOSIS — R6889 Other general symptoms and signs: Secondary | ICD-10-CM

## 2015-06-04 DIAGNOSIS — R269 Unspecified abnormalities of gait and mobility: Secondary | ICD-10-CM

## 2015-06-04 DIAGNOSIS — M6289 Other specified disorders of muscle: Secondary | ICD-10-CM | POA: Diagnosis not present

## 2015-06-04 DIAGNOSIS — M25661 Stiffness of right knee, not elsewhere classified: Secondary | ICD-10-CM | POA: Diagnosis not present

## 2015-06-04 DIAGNOSIS — R29898 Other symptoms and signs involving the musculoskeletal system: Secondary | ICD-10-CM

## 2015-06-04 DIAGNOSIS — R531 Weakness: Secondary | ICD-10-CM

## 2015-06-04 NOTE — Therapy (Signed)
[facility] [place] [address] [place], [place], [address] Phone: [phone]   Fax:  [phone]  Physical Therapy Treatment  Patient Details  Name: Wanda Valencia MRN: [id number] Date of Birth: Sep 13, 1966 Referring Provider:  Jodi Geralds, MD  Encounter Date: 06/04/2015      PT End of Session - 06/04/15 1541    Visit Number 2   Number of Visits 16   Date for PT Re-Evaluation 08/04/15   PT Start Time 1535   PT Stop Time 1626   PT Time Calculation (min) 51 min   Activity Tolerance Patient tolerated treatment well      Past Medical History  Diagnosis Date  . Incontinence   . 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 Hypertension   . Overactive bladder   . Kidney stones   . Hx: UTI (urinary tract infection)   . Family history of adverse reaction to anesthesia     "my sister has reactions to about anything; anesthesia included"  . Chronic bronchitis     "born w/it; get it alot"  . GERD (gastroesophageal reflux disease)   . Arthritis     "right knee and thumb" (05/08/2015)    Past Surgical History  Procedure Laterality Date  . Cesarean section  1989; 1991  . Cystoscopy w/ stone manipulation  09/2006  . Joint replacement    . 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 Tubal ligation  10/14/2011  . 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  Visit Diagnosis:  Stiffness of knee joint, right  Weakness of both hips  Abnormal gait  Decreased strength, endurance, and mobility      Subjective Assessment - 06/04/15 1553    Subjective Pt  reports she has a bruise from one of the exercises issued from HEP, had to stop performing that.  Otherwise, things are going well.    Currently in Pain? No/denies            [facility] PT Assessment  - 06/04/15 0001    Assessment   Medical Diagnosis Rt TKA   Onset Date/Surgical Date 05/08/15   Hand Dominance Right   Next MD Visit 06/16/15   Prior Therapy home health 3-weeks   AROM   Left Knee Flexion 95           [facility] Adult PT Treatment/Exercise - 06/04/15 0001    Knee/Hip Exercises: Aerobic   Recumbent Bike partial x 4 min    Nustep L4: 5 min    Knee/Hip Exercises: Standing   Lateral Step Up Right;1 set;10 reps;Hand Hold: 2;Step Height: 6"   Forward Step Up Right;1 set;10 reps;Hand Hold: 2;Step Height: 6"   Forward Step Up Limitations VC for form    Knee/Hip Exercises: Seated   Other Seated Knee/Hip Exercises Seated scoots to increase Rt knee ROM with 5 sec hold x 12 reps    Knee/Hip Exercises: Prone   Hamstring Curl 1 set;10 reps   Prone Knee Hang 1 minute  x2 reps   Electrical Stimulation   Electrical Stimulation Location Rt knee    Electrical Stimulation Action ion repelling    Electrical Stimulation Parameters to tolerance x 15 min    Electrical Stimulation Goals Edema;Pain   Vasopneumatic   Number Minutes Vasopneumatic  15 minutes  Vasopnuematic Location  Knee  Rt   Vasopneumatic Pressure Medium   Vasopneumatic Temperature  3*                  PT Short Term Goals - 06/02/15 1237    PT SHORT TERM GOAL #1   Title Patient I in initial HEP 06/30/15   Time 4   Period Weeks   Status New   PT SHORT TERM GOAL #2   Title Increase knee Rt ROM to -2 deg ext - 105 deg flex 06/30/15   Time 4   Period Weeks   Status New   PT SHORT TERM GOAL #3   Title Increase bilat strength hip extension 1/2 muscle grade 06/30/15   Time 4   Period Weeks   Status New   PT SHORT TERM GOAL #4   Title Increase standing tolerance to 15-20 min10/04/16   Time 4   Period Weeks   Status New   PT SHORT TERM GOAL #5   Title Normal gait pattern without limp Rt LE 07/14/15   Time 6   Period Weeks   Status New           PT Long Term Goals - 06/02/15 1240    PT  LONG TERM GOAL #1   Title Patient I in HEP for discharge 08/04/15   Time 8   Period Weeks   Status New   PT LONG TERM GOAL #2   Title ROM Rt knee +/> than ROM Lt knee 08/04/15   Time 8   Period Weeks   Status New   PT LONG TERM GOAL #3   Title 5/5 strength bilat hip extension 08/04/15   Time 8   Period Weeks   Status New   PT LONG TERM GOAL #4   Title Patient to tolerate 2-3 hours sitting in preparation for return to work 08/04/15   Time 8   Period Weeks   Status New   PT LONG TERM GOAL #5   Title Decrease FOTO to </=41% limitation 08/04/15   Time 8   Period Weeks   Status New               Plan - 06/04/15 1615    Clinical Impression Statement Pt tolerated all new exercises with minimal increase in pain.  Pt demo slight increase in Rt knee flexion.  Prone hang and hamstring curls challenging.  Pt progressing towards goals.    Pt will benefit from skilled therapeutic intervention in order to improve on the following deficits Decreased range of motion;Decreased strength;Abnormal gait;Pain;Decreased activity tolerance;Decreased endurance;Obesity   Rehab Potential Good   PT Frequency 2x / week   PT Duration 8 weeks   PT Treatment/Interventions Patient/family education;ADLs/Self Care Home Management;Therapeutic exercise;Therapeutic activities;Scar mobilization;Dry needling;Cryotherapy;Electrical Stimulation;Moist Heat;Ultrasound;Manual techniques   PT Next Visit Plan review HEP; progress with ROM/strengthening   Consulted and Agree with Plan of Care Patient        Problem List Patient Active Problem List   Diagnosis Date Noted  . Primary osteoarthritis of right knee 05/08/2015  . Cough 04/14/2015  . Viral wart on right thumb 12/09/2014  . Plantar fasciitis, left 08/25/2014  . Fatty liver disease, nonalcoholic 06/17/2014  . Cholelithiases 06/17/2014  . History of kidney stones 05/20/2014  . Seborrheic keratosis 07/09/2013  . Cutaneous skin tags 07/09/2013  .  Hyperlipidemia LDL goal < 100 11/29/2012  . Annual physical exam 07/17/2012  . 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 Obesity 07/17/2012  .  Essential hypertension, benign 07/17/2012  . Right first Metacarpophalangeal joint pain 04/30/2012    Mayer Camel, PTA 06/04/2015 4:16 PM  [facility] [place] [address] [place], [place], [address] Phone: [phone]   Fax:  [phone]

## 2015-06-09 ENCOUNTER — Ambulatory Visit (INDEPENDENT_AMBULATORY_CARE_PROVIDER_SITE_OTHER): Payer: BLUE CROSS/BLUE SHIELD | Admitting: Physical Therapy

## 2015-06-09 DIAGNOSIS — M6289 Other specified disorders of muscle: Secondary | ICD-10-CM

## 2015-06-09 DIAGNOSIS — M25661 Stiffness of right knee, not elsewhere classified: Secondary | ICD-10-CM | POA: Diagnosis not present

## 2015-06-09 DIAGNOSIS — R29898 Other symptoms and signs involving the musculoskeletal system: Secondary | ICD-10-CM

## 2015-06-09 DIAGNOSIS — R269 Unspecified abnormalities of gait and mobility: Secondary | ICD-10-CM

## 2015-06-09 NOTE — Therapy (Signed)
[facility] [place] [address] [place], [place], [address] Phone: [phone]   Fax:  [phone]  Physical Therapy Treatment  Patient Details  Name: Wanda Valencia MRN: [id number] Date of Birth: 1966/08/29 Referring Provider:  Jodi Geralds, MD  Encounter Date: 06/09/2015      PT End of Session - 06/09/15 1107    Visit Number 3   Number of Visits 16   Date for PT Re-Evaluation 08/04/15   PT Start Time 1105   PT Stop Time 1208   PT Time Calculation (min) 63 min   Activity Tolerance Patient tolerated treatment well      Past Medical History  Diagnosis Date  . Incontinence   . 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 Hypertension   . Overactive bladder   . Kidney stones   . Hx: UTI (urinary tract infection)   . Family history of adverse reaction to anesthesia     "my sister has reactions to about anything; anesthesia included"  . Chronic bronchitis     "born w/it; get it alot"  . GERD (gastroesophageal reflux disease)   . Arthritis     "right knee and thumb" (05/08/2015)    Past Surgical History  Procedure Laterality Date  . Cesarean section  1989; 1991  . Cystoscopy w/ stone manipulation  09/2006  . Joint replacement    . 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 Tubal ligation  10/14/2011  . 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  Visit Diagnosis:  Stiffness of knee joint, right  Weakness of both hips  Abnormal gait      Subjective Assessment - 06/09/15 1108    Subjective Pt reported she hasn't needed to use her SPC the last two days. Pt feels her knee ROM is improving.    Currently in Pain? No/denies            [facility] PT Assessment - 06/09/15 0001    Assessment   Medical Diagnosis Rt TKA   Onset Date/Surgical  Date 05/08/15   Hand Dominance Right   Next MD Visit 06/16/15   Prior Therapy home health 3-weeks   AROM   Left Knee Extension -8  (with quad set)   Left Knee Flexion 98                     [facility] Adult PT Treatment/Exercise - 06/09/15 0001    Knee/Hip Exercises: Aerobic   Recumbent Bike partial revolutions x 6 min (last two min able to perform full revolutions backwards)   Knee/Hip Exercises: Standing   Lateral Step Up Right;2 sets;10 reps;Hand Hold: 2;Step Height: 6"   Forward Step Up Right;2 sets;10 reps;Hand Hold: 1;Step Height: 6"   Wall Squat 10 reps   Knee/Hip Exercises: Seated   Other Seated Knee/Hip Exercises Seated scoots to increase Rt knee ROM with 10 sec hold x 5    Sit to Sand 10 reps;without UE support  (4 reps with Lt foot on pad to increase Rt WB)   Knee/Hip Exercises: Supine   Knee Extension PROM;5 reps  overpressure from therapist x 20 sec (to tolerance)   Knee/Hip Exercises: Prone   Hamstring Curl 1 set;10 reps   Hamstring Curl Limitations (challenging, not ready to add wt yet)  Prone Knee Hang 1 minute  x2 reps   Modalities   Modalities Office manager Location Rt knee    Astronomer Stimulation Parameters to tolerance   Electrical Stimulation Goals Edema;Pain   Vasopneumatic   Number Minutes Vasopneumatic  15 minutes   Vasopnuematic Location  Knee   Vasopneumatic Pressure Medium   Vasopneumatic Temperature  3*         PT Education - 06/09/15 1215    Education provided Yes   Education Details added SLS to HEP, self care regarding desensitizing knee/scar massage.    Person(s) Educated Patient   Methods Explanation;Handout;Demonstration   Comprehension Returned demonstration;Verbalized understanding          PT Short Term Goals - 06/02/15 1237    PT SHORT TERM GOAL #1   Title Patient I in initial HEP 06/30/15    Time 4   Period Weeks   Status New   PT SHORT TERM GOAL #2   Title Increase knee Rt ROM to -2 deg ext - 105 deg flex 06/30/15   Time 4   Period Weeks   Status New   PT SHORT TERM GOAL #3   Title Increase bilat strength hip extension 1/2 muscle grade 06/30/15   Time 4   Period Weeks   Status New   PT SHORT TERM GOAL #4   Title Increase standing tolerance to 15-20 min10/04/16   Time 4   Period Weeks   Status New   PT SHORT TERM GOAL #5   Title Normal gait pattern without limp Rt LE 07/14/15   Time 6   Period Weeks   Status New           PT Long Term Goals - 06/02/15 1240    PT LONG TERM GOAL #1   Title Patient I in HEP for discharge 08/04/15   Time 8   Period Weeks   Status New   PT LONG TERM GOAL #2   Title ROM Rt knee +/> than ROM Lt knee 08/04/15   Time 8   Period Weeks   Status New   PT LONG TERM GOAL #3   Title 5/5 strength bilat hip extension 08/04/15   Time 8   Period Weeks   Status New   PT LONG TERM GOAL #4   Title Patient to tolerate 2-3 hours sitting in preparation for return to work 08/04/15   Time 8   Period Weeks   Status New   PT LONG TERM GOAL #5   Title Decrease FOTO to </=41% limitation 08/04/15   Time 8   Period Weeks   Status New               Plan - 06/09/15 1132    Clinical Impression Statement Pt able to do full revolutions (backward) on bicycle today. Pt demo slight improvement in Rt knee ROM.  Tolerated all exercises with minimal increase in pain and increased reps.  Pt progressing towards goals.    Pt will benefit from skilled therapeutic intervention in order to improve on the following deficits Decreased range of motion;Decreased strength;Abnormal gait;Pain;Decreased activity tolerance;Decreased endurance;Obesity   Rehab Potential Good   PT Frequency 2x / week   PT Duration 8 weeks   PT Treatment/Interventions Patient/family education;ADLs/Self Care Home Management;Therapeutic exercise;Therapeutic activities;Scar  mobilization;Dry needling;Cryotherapy;Electrical Stimulation;Moist Heat;Ultrasound;Manual techniques   PT Next Visit Plan  progress with Rt knee ROM/strengthening   Consulted  and Agree with Plan of Care Patient        Problem List Patient Active Problem List   Diagnosis Date Noted  . Primary osteoarthritis of right knee 05/08/2015  . Cough 04/14/2015  . Viral wart on right thumb 12/09/2014  . Plantar fasciitis, left 08/25/2014  . Fatty liver disease, nonalcoholic 06/17/2014  . Cholelithiases 06/17/2014  . History of kidney stones 05/20/2014  . Seborrheic keratosis 07/09/2013  . Cutaneous skin tags 07/09/2013  . Hyperlipidemia LDL goal < 100 11/29/2012  . Annual physical exam 07/17/2012  . 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 Obesity 07/17/2012  . Essential hypertension, benign 07/17/2012  . Right first Metacarpophalangeal joint pain 04/30/2012    Mayer Camel, PTA 06/09/2015 12:20 PM  [facility] [place] [address] [place], [place], [address] Phone: [phone]   Fax:  [phone]

## 2015-06-09 NOTE — Patient Instructions (Addendum)
POSITION: Single Leg Balance: Neck Rotated / Stance Side   Stand on right leg. Rotate head to stance side.  __10_ reps ___ times per day.  Soft Mat: One-Leg Stand -    Balancing on one leg on mat.  Hold each position _15-30___ seconds. Repeat on other leg. Do __2__ repetitions, __2__ sets.   [facility] [address] [place], [place] [address]  [phone] (office) [phone] (fax)

## 2015-06-10 ENCOUNTER — Encounter: Payer: Self-pay | Admitting: Sports Medicine

## 2015-06-10 ENCOUNTER — Ambulatory Visit (INDEPENDENT_AMBULATORY_CARE_PROVIDER_SITE_OTHER): Payer: BLUE CROSS/BLUE SHIELD | Admitting: Sports Medicine

## 2015-06-10 VITALS — BP 133/77 | HR 86 | Ht 64.0 in | Wt 228.0 lb

## 2015-06-10 DIAGNOSIS — M25541 Pain in joints of right hand: Secondary | ICD-10-CM

## 2015-06-10 DIAGNOSIS — Z96651 Presence of right artificial knee joint: Secondary | ICD-10-CM | POA: Diagnosis not present

## 2015-06-10 DIAGNOSIS — M79641 Pain in right hand: Secondary | ICD-10-CM | POA: Diagnosis not present

## 2015-06-10 DIAGNOSIS — Z23 Encounter for immunization: Secondary | ICD-10-CM | POA: Diagnosis not present

## 2015-06-10 NOTE — Assessment & Plan Note (Signed)
Doing extremely well. 

## 2015-06-10 NOTE — Assessment & Plan Note (Signed)
16 month response to previous injection, right first MCP injection today, return as needed.

## 2015-06-10 NOTE — Progress Notes (Signed)
  Subjective:    CC: Right hand pain  HPI: This pleasant 49 year old returns, she has right first metacarpophalangeal joint osteoarthritis that responded to a first MCP injection approximately 16 months ago. She does desire repeat injection, pain is moderate, persistent without radiation.   Past medical history, Surgical history, Family history not pertinant except as noted below, Social history, Allergies, and medications have been entered into the medical record, reviewed, and no changes needed.   Review of Systems: No fevers, chills, night sweats, weight loss, chest pain, or shortness of breath.   Objective:    General: Well Developed, well nourished, and in no acute distress.  Neuro: Alert and oriented x3, extra-ocular muscles intact, sensation grossly intact.  HEENT: Normocephalic, atraumatic, pupils equal round reactive to light, neck supple, no masses, no lymphadenopathy, thyroid nonpalpable.  Skin: Warm and dry, no rashes. Cardiac: Regular rate and rhythm, no murmurs rubs or gallops, no lower extremity edema.  Respiratory: Clear to auscultation bilaterally. Not using accessory muscles, speaking in full sentences.  Procedure: Real-time Ultrasound Guided Injection of right first MCP Device: GE Logiq E  Verbal informed consent obtained.  Time-out conducted.  Noted no overlying erythema, induration, or other signs of local infection.  Skin prepped in a sterile fashion.  Local anesthesia: Topical Ethyl chloride.  With sterile technique and under real time ultrasound guidance: 25-gauge needle advanced into the MCP, a total 0.5 cc Kenalog 40, 0.5 cc lidocaine injected slowly. Completed without difficulty  Pain immediately resolved suggesting accurate placement of the medication.  Advised to call if fevers/chills, erythema, induration, drainage, or persistent bleeding.  Images permanently stored and available for review in the ultrasound unit.  Impression: Technically  successful ultrasound guided injection.  Impression and Recommendations:

## 2015-06-11 ENCOUNTER — Ambulatory Visit (INDEPENDENT_AMBULATORY_CARE_PROVIDER_SITE_OTHER): Payer: BLUE CROSS/BLUE SHIELD | Admitting: Rehabilitative and Restorative Service Providers"

## 2015-06-11 ENCOUNTER — Encounter: Payer: Self-pay | Admitting: Rehabilitative and Restorative Service Providers"

## 2015-06-11 ENCOUNTER — Telehealth: Payer: Self-pay

## 2015-06-11 DIAGNOSIS — Z7409 Other reduced mobility: Secondary | ICD-10-CM

## 2015-06-11 DIAGNOSIS — M25661 Stiffness of right knee, not elsewhere classified: Secondary | ICD-10-CM | POA: Diagnosis not present

## 2015-06-11 DIAGNOSIS — M6289 Other specified disorders of muscle: Secondary | ICD-10-CM | POA: Diagnosis not present

## 2015-06-11 DIAGNOSIS — R269 Unspecified abnormalities of gait and mobility: Secondary | ICD-10-CM | POA: Diagnosis not present

## 2015-06-11 DIAGNOSIS — R531 Weakness: Secondary | ICD-10-CM

## 2015-06-11 DIAGNOSIS — R29898 Other symptoms and signs involving the musculoskeletal system: Secondary | ICD-10-CM

## 2015-06-11 NOTE — Telephone Encounter (Signed)
Patient called stated that she had a injection done in her thumb on 06/10/15 and every since she left she has been having pain shooting down to her thumb and it is hot to the touch, patient want to know if there is anything going on with that and what she should do. Please advise patient. Dondrea Clendenin,CMA

## 2015-06-11 NOTE — Therapy (Signed)
[facility] [place] [address] [place], [place], [address] Phone: [phone]   Fax:  [phone]  Physical Therapy Treatment  Patient Details  Name: Wanda Valencia MRN: [id number] Date of Birth: 1966-07-14 Referring Provider:  Jodi Geralds, MD  Encounter Date: 06/11/2015      PT End of Session - 06/11/15 1414    Visit Number 4   Number of Visits 16   Date for PT Re-Evaluation 08/04/15   PT Start Time 1406   PT Stop Time 1503   PT Time Calculation (min) 57 min   Activity Tolerance Patient tolerated treatment well      Past Medical History  Diagnosis Date  . Incontinence   . 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 Hypertension   . Overactive bladder   . Kidney stones   . Hx: UTI (urinary tract infection)   . Family history of adverse reaction to anesthesia     "my sister has reactions to about anything; anesthesia included"  . Chronic bronchitis     "born w/it; get it alot"  . GERD (gastroesophageal reflux disease)   . Arthritis     "right knee and thumb" (05/08/2015)    Past Surgical History  Procedure Laterality Date  . Cesarean section  1989; 1991  . Cystoscopy w/ stone manipulation  09/2006  . Joint replacement    . 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 Tubal ligation  10/14/2011  . 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  Visit Diagnosis:  Stiffness of knee joint, right  Weakness of both hips  Abnormal gait  Decreased strength, endurance, and mobility      Subjective Assessment - 06/11/15 1415    Subjective Knee is feeling better. She is now walking without cane. working on exercises at home.   Currently in Pain? No/denies            [facility] PT Assessment - 06/11/15 0001    Assessment   Medical  Diagnosis Rt TKA   Onset Date/Surgical Date 05/08/15   Hand Dominance Right   Next MD Visit 06/16/15   Prior Therapy home health 3-weeks   AROM   Left Knee Extension -8   Left Knee Flexion 110                     [facility] Adult PT Treatment/Exercise - 06/11/15 0001    Self-Care   Self-Care --  instructed in/worked on scar massage/myofacial wk   Knee/Hip Exercises: Stretches   Lobbyist 3 reps;30 seconds   Knee/Hip Exercises: Aerobic   Recumbent Bike partial reveloution initially/backward/forward 8 min    Knee/Hip Exercises: Standing   Knee Flexion Limitations foot on 13 inch stool lean forward hold for 20 sec 5 reps    Knee/Hip Exercises: Seated   Other Seated Knee/Hip Exercises Seated scoots to increase Rt knee ROM with 10 sec hold x 5    Knee/Hip Exercises: Supine   Quad Sets Strengthening;Right;1 set;10 reps   Quad Sets Limitations heel on 1/2 roll   Knee Flexion Limitations knee flexion working on ball flexion 7 reps 15 sec hold   Modalities   Modalities Office manager Location Rt knee    Engineer, manufacturing ion  repelling   Electrical Stimulation Parameters to tolerance   Electrical Stimulation Goals Edema;Pain   Vasopneumatic   Number Minutes Vasopneumatic  15 minutes   Vasopnuematic Location  Knee   Vasopneumatic Pressure Medium   Vasopneumatic Temperature  3*   Manual Therapy   Manual therapy comments scar massage/instructed in scar massage for home.    Soft tissue mobilization Rt quad    Passive ROM Rt knee flexion/extension                 PT Education - 06/11/15 1457    Education provided Yes   Education Details to add massage/myofacila release work/cont scar massage/add flexion on step    Person(s) Educated Patient   Methods Explanation;Demonstration;Tactile cues;Verbal cues   Comprehension Verbalized understanding;Returned demonstration;Verbal cues  required;Tactile cues required          PT Short Term Goals - 06/02/15 1237    PT SHORT TERM GOAL #1   Title Patient I in initial HEP 06/30/15   Time 4   Period Weeks   Status New   PT SHORT TERM GOAL #2   Title Increase knee Rt ROM to -2 deg ext - 105 deg flex 06/30/15   Time 4   Period Weeks   Status New   PT SHORT TERM GOAL #3   Title Increase bilat strength hip extension 1/2 muscle grade 06/30/15   Time 4   Period Weeks   Status New   PT SHORT TERM GOAL #4   Title Increase standing tolerance to 15-20 min10/04/16   Time 4   Period Weeks   Status New   PT SHORT TERM GOAL #5   Title Normal gait pattern without limp Rt LE 07/14/15   Time 6   Period Weeks   Status New           PT Long Term Goals - 06/02/15 1240    PT LONG TERM GOAL #1   Title Patient I in HEP for discharge 08/04/15   Time 8   Period Weeks   Status New   PT LONG TERM GOAL #2   Title ROM Rt knee +/> than ROM Lt knee 08/04/15   Time 8   Period Weeks   Status New   PT LONG TERM GOAL #3   Title 5/5 strength bilat hip extension 08/04/15   Time 8   Period Weeks   Status New   PT LONG TERM GOAL #4   Title Patient to tolerate 2-3 hours sitting in preparation for return to work 08/04/15   Time 8   Period Weeks   Status New   PT LONG TERM GOAL #5   Title Decrease FOTO to </=41% limitation 08/04/15   Time 8   Period Weeks   Status New               Plan - 06/11/15 1458    Clinical Impression Statement Progressing well good gains in knee flexioin today.    Pt will benefit from skilled therapeutic intervention in order to improve on the following deficits Decreased range of motion;Decreased strength;Abnormal gait;Pain;Decreased activity tolerance;Decreased endurance;Obesity   Rehab Potential Good   PT Frequency 2x / week   PT Duration 8 weeks   PT Treatment/Interventions Patient/family education;ADLs/Self Care Home Management;Therapeutic exercise;Therapeutic activities;Scar  mobilization;Dry needling;Cryotherapy;Electrical Stimulation;Moist Heat;Ultrasound;Manual techniques   PT Next Visit Plan  progress with Rt knee ROM/strengthening   PT Home Exercise Plan scar massage; ROM/stretching; strengthening   Consulted and Agree with Plan of  Care Patient        Problem List Patient Active Problem List   Diagnosis Date Noted  . History of arthroplasty of right knee 05/08/2015  . Cough 04/14/2015  . Viral wart on right thumb 12/09/2014  . Plantar fasciitis, left 08/25/2014  . Fatty liver disease, nonalcoholic 06/17/2014  . Cholelithiases 06/17/2014  . History of kidney stones 05/20/2014  . Seborrheic keratosis 07/09/2013  . Cutaneous skin tags 07/09/2013  . Hyperlipidemia LDL goal < 100 11/29/2012  . Annual physical exam 07/17/2012  . 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 Obesity 07/17/2012  . Essential hypertension, benign 07/17/2012  . Right first Metacarpophalangeal joint pain 04/30/2012    Marjean Imperato Rober Minion PT, MPH 06/11/2015, 2:59 PM  [facility] [address] [place] [address] [place], [place], [address] Phone: [phone]   Fax:  [phone]

## 2015-06-11 NOTE — Telephone Encounter (Signed)
Typical steroid flare, this will resolve on its own, icing, NSAIDs.

## 2015-06-11 NOTE — Telephone Encounter (Signed)
Patient has been advised. Wanda Valencia,CMA  

## 2015-06-16 ENCOUNTER — Ambulatory Visit: Payer: Self-pay | Admitting: Sports Medicine

## 2015-06-16 ENCOUNTER — Ambulatory Visit (INDEPENDENT_AMBULATORY_CARE_PROVIDER_SITE_OTHER): Payer: BLUE CROSS/BLUE SHIELD | Admitting: Physical Therapy

## 2015-06-16 DIAGNOSIS — R6889 Other general symptoms and signs: Secondary | ICD-10-CM

## 2015-06-16 DIAGNOSIS — Z7409 Other reduced mobility: Secondary | ICD-10-CM | POA: Diagnosis not present

## 2015-06-16 DIAGNOSIS — M25661 Stiffness of right knee, not elsewhere classified: Secondary | ICD-10-CM

## 2015-06-16 DIAGNOSIS — R269 Unspecified abnormalities of gait and mobility: Secondary | ICD-10-CM

## 2015-06-16 DIAGNOSIS — M6289 Other specified disorders of muscle: Secondary | ICD-10-CM | POA: Diagnosis not present

## 2015-06-16 DIAGNOSIS — R531 Weakness: Secondary | ICD-10-CM

## 2015-06-16 DIAGNOSIS — R29898 Other symptoms and signs involving the musculoskeletal system: Secondary | ICD-10-CM

## 2015-06-16 NOTE — Therapy (Signed)
[facility] [place] [address]  [address] [place], [place], [address] Phone: [phone]   Fax:  [phone]  Physical Therapy Treatment  Patient Details  Name: Wanda Valencia MRN: [id number] Date of Birth: 1965-11-07 Referring Provider:  Jodi Geralds, MD  Encounter Date: 06/16/2015      PT End of Session - 06/16/15 1107    Visit Number 5   Number of Visits 16   Date for PT Re-Evaluation 08/04/15   PT Start Time 1104   PT Stop Time 1157   PT Time Calculation (min) 53 min   Activity Tolerance Patient tolerated treatment well      Past Medical History  Diagnosis Date  . Incontinence   . 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 Hypertension   . Overactive bladder   . Kidney stones   . Hx: UTI (urinary tract infection)   . Family history of adverse reaction to anesthesia     "my sister has reactions to about anything; anesthesia included"  . Chronic bronchitis     "born w/it; get it alot"  . GERD (gastroesophageal reflux disease)   . Arthritis     "right knee and thumb" (05/08/2015)    Past Surgical History  Procedure Laterality Date  . Cesarean section  1989; 1991  . Cystoscopy w/ stone manipulation  09/2006  . Joint replacement    . 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 Tubal ligation  10/14/2011  . 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  Visit Diagnosis:  Stiffness of knee joint, right  Weakness of both hips  Abnormal gait  Decreased strength, endurance, and mobility      Subjective Assessment - 06/16/15 1109    Subjective Things are feeling better each day.  HEP going well.  SLS still challenging.    Currently in Pain? No/denies            [facility] PT Assessment - 06/16/15 0001    Assessment   Medical Diagnosis Rt  TKA   Onset Date/Surgical Date 05/08/15   Hand Dominance Right   Next MD Visit 06/16/15   Prior Therapy home health 3-weeks   AROM   Left Knee Extension -8   Left Knee Flexion 112          [facility] Adult PT Treatment/Exercise - 06/16/15 0001    Knee/Hip Exercises: Stretches   Gastroc Stretch Both;2 reps;30 seconds   Other Knee/Hip Stretches Lunge on 13" step, followed by hamstring stretch (each position held 15 sec x 10 reps) to increase Rt knee ROM.    Other Knee/Hip Stretches --  Rt knee flexion/extension with overpressure to tolerance x 5   Knee/Hip Exercises: Aerobic   Recumbent Bike Partial revolutions x 1 min; L1 x 5 min    Knee/Hip Exercises: Standing   Heel Raises 15 reps;Both   Step Down Left;1 set;10 reps;Hand Hold: 2;Step Height: 2"   Step Down Limitations (heel taps with Lt heel - challenging)   SLS Rt stance with horizontal head turns x 30 sec x 2, on blue pad with occasional UE support x 20 x 2   Knee/Hip Exercises: Prone   Hamstring Curl 2 sets;10 reps   Hamstring Curl Limitations 2nd set with 2# at ankle    Prone Knee Hang 1 minute  2 reps    Modalities   Modalities Engineer, building services Parameters to tolerance    Electrical Stimulation Goals Edema;Pain   Vasopneumatic   Number Minutes Vasopneumatic  15 minutes   Vasopnuematic Location  Knee   Vasopneumatic Pressure Medium   Vasopneumatic Temperature  3*   Manual Therapy   Passive ROM --                PT Education - 06/16/15 1311    Education provided Yes   Education Details HEP - added heel taps    Person(s) Educated Patient   Methods Handout;Explanation   Comprehension Returned demonstration;Verbalized understanding          PT Short Term Goals - 06/02/15 1237    PT SHORT TERM GOAL #1   Title Patient I in initial HEP 06/30/15    Time 4   Period Weeks   Status New   PT SHORT TERM GOAL #2   Title Increase knee Rt ROM to -2 deg ext - 105 deg flex 06/30/15   Time 4   Period Weeks   Status New   PT SHORT TERM GOAL #3   Title Increase bilat strength hip extension 1/2 muscle grade 06/30/15   Time 4   Period Weeks   Status New   PT SHORT TERM GOAL #4   Title Increase standing tolerance to 15-20 min10/04/16   Time 4   Period Weeks   Status New   PT SHORT TERM GOAL #5   Title Normal gait pattern without limp Rt LE 07/14/15   Time 6   Period Weeks   Status New           PT Long Term Goals - 06/02/15 1240    PT LONG TERM GOAL #1   Title Patient I in HEP for discharge 08/04/15   Time 8   Period Weeks   Status New   PT LONG TERM GOAL #2   Title ROM Rt knee +/> than ROM Lt knee 08/04/15   Time 8   Period Weeks   Status New   PT LONG TERM GOAL #3   Title 5/5 strength bilat hip extension 08/04/15   Time 8   Period Weeks   Status New   PT LONG TERM GOAL #4   Title Patient to tolerate 2-3 hours sitting in preparation for return to work 08/04/15   Time 8   Period Weeks   Status New   PT LONG TERM GOAL #5   Title Decrease FOTO to </=41% limitation 08/04/15   Time 8   Period Weeks   Status New               Plan - 06/16/15 1311    Clinical Impression Statement Pt demo improved Rt knee flexion (up to 112 deg); continues with limited knee extension ROM lacking 8 deg.  Pt making good gains towards goals each visit.  Pt will benefit from continued PT intervention to maximize function.    Pt will benefit from skilled therapeutic intervention in order to improve on the following deficits Decreased range of motion;Decreased strength;Abnormal gait;Pain;Decreased activity tolerance;Decreased endurance;Obesity   Rehab Potential Good   PT Frequency 2x / week   PT Duration 8 weeks   PT Treatment/Interventions Patient/family education;ADLs/Self Care Home Management;Therapeutic exercise;Therapeutic  activities;Scar mobilization;Dry needling;Cryotherapy;Electrical Stimulation;Moist Heat;Ultrasound;Manual techniques   PT Next  Visit Plan  progress with Rt knee ROM/strengthening   Consulted and Agree with Plan of Care Patient        Problem List Patient Active Problem List   Diagnosis Date Noted  . History of arthroplasty of right knee 05/08/2015  . Cough 04/14/2015  . Viral wart on right thumb 12/09/2014  . Plantar fasciitis, left 08/25/2014  . Fatty liver disease, nonalcoholic 06/17/2014  . Cholelithiases 06/17/2014  . History of kidney stones 05/20/2014  . Seborrheic keratosis 07/09/2013  . Cutaneous skin tags 07/09/2013  . Hyperlipidemia LDL goal < 100 11/29/2012  . Annual physical exam 07/17/2012  . 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 Obesity 07/17/2012  . Essential hypertension, benign 07/17/2012  . Right first Metacarpophalangeal joint pain 04/30/2012    Mayer Camel, PTA 06/16/2015 1:13 PM  [facility] [place] [address] [place] [address] [place], [place], [address] Phone: [phone]   Fax:  [phone]

## 2015-06-16 NOTE — Patient Instructions (Signed)
Heel taps    Stand with both feet on _3__ inch step. Touch Left heel to the floor and return __5-10_ times. _1-2__ sets _1__ times per day.

## 2015-06-18 ENCOUNTER — Encounter: Payer: Self-pay | Admitting: Rehabilitative and Restorative Service Providers"

## 2015-06-18 ENCOUNTER — Ambulatory Visit (INDEPENDENT_AMBULATORY_CARE_PROVIDER_SITE_OTHER): Payer: BLUE CROSS/BLUE SHIELD | Admitting: Rehabilitative and Restorative Service Providers"

## 2015-06-18 DIAGNOSIS — M6289 Other specified disorders of muscle: Secondary | ICD-10-CM | POA: Diagnosis not present

## 2015-06-18 DIAGNOSIS — R531 Weakness: Secondary | ICD-10-CM

## 2015-06-18 DIAGNOSIS — M25661 Stiffness of right knee, not elsewhere classified: Secondary | ICD-10-CM | POA: Diagnosis not present

## 2015-06-18 DIAGNOSIS — R269 Unspecified abnormalities of gait and mobility: Secondary | ICD-10-CM | POA: Diagnosis not present

## 2015-06-18 DIAGNOSIS — Z7409 Other reduced mobility: Secondary | ICD-10-CM | POA: Diagnosis not present

## 2015-06-18 DIAGNOSIS — R29898 Other symptoms and signs involving the musculoskeletal system: Secondary | ICD-10-CM

## 2015-06-18 DIAGNOSIS — R6889 Other general symptoms and signs: Secondary | ICD-10-CM

## 2015-06-18 NOTE — Therapy (Signed)
[facility] [place] [address] [place] [address] [place], [place], [address] Phone: [phone]   Fax:  [phone]  Physical Therapy Treatment  Patient Details  Name: Wanda Valencia MRN: [id number] Date of Birth: 25-Nov-1965 Referring Candita Borenstein:  Jodi Geralds, MD  Encounter Date: 06/18/2015      PT End of Session - 06/18/15 1407    Visit Number 6   Number of Visits 16   Date for PT Re-Evaluation 08/04/15   PT Start Time 1404   PT Stop Time 1459   PT Time Calculation (min) 55 min   Activity Tolerance Patient tolerated treatment well      Past Medical History  Diagnosis Date  . Incontinence   . 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 Hypertension   . Overactive bladder   . Kidney stones   . Hx: UTI (urinary tract infection)   . Family history of adverse reaction to anesthesia     "my sister has reactions to about anything; anesthesia included"  . Chronic bronchitis     "born w/it; get it alot"  . GERD (gastroesophageal reflux disease)   . Arthritis     "right knee and thumb" (05/08/2015)    Past Surgical History  Procedure Laterality Date  . Cesarean section  1989; 1991  . Cystoscopy w/ stone manipulation  09/2006  . Joint replacement    . 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 Tubal ligation  10/14/2011  . 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  Visit Diagnosis:  Stiffness of knee joint, right  Weakness of both hips  Abnormal gait  Decreased strength, endurance, and mobility      Subjective Assessment - 06/18/15 1407    Subjective Seen by MD Monday and he is pleased with her progress. She feels she is ocntinuing to make progress and is pleased with where she is at 5 weeks from surgery,    Currently in Pain? No/denies                          [facility] Adult PT Treatment/Exercise - 06/18/15 0001    Knee/Hip Exercises: Stretches   Passive Hamstring Stretch 3 reps;30 seconds   Other Knee/Hip Stretches IT band stretch with strap 30 sec hold 3 reps    Knee/Hip Exercises: Aerobic   Recumbent Bike L1 x 6 min   Knee/Hip Exercises: Standing   Heel Raises 15 reps;Both   Step Down Left;1 set;10 reps;Hand Hold: 2;Step Height: 2"   Step Down Limitations (heel taps with Lt heel - challenging)   Knee/Hip Exercises: Seated   Other Seated Knee/Hip Exercises Seated scoots to increase Rt knee ROM with 10 sec hold x 5    Knee/Hip Exercises: Supine   Short Arc Quad Sets Right;2 sets;10 reps  5 sec hold   Straight Leg Raises Right;2 sets;5 reps  difficult    Modalities   Modalities Electrical Stimulation;Vasopneumatic   Programme researcher, broadcasting/film/video Location Rt knee    Astronomer Stimulation Parameters to tolerance   Electrical Stimulation Goals Edema;Pain   Vasopneumatic   Number Minutes Vasopneumatic  15 minutes   Vasopnuematic Location  Knee   Vasopneumatic Pressure Medium   Vasopneumatic Temperature  3*  Manual Therapy   Manual therapy comments scar massage   Soft tissue mobilization Rt quad    Passive ROM Rt knee flexion/extension                 PT Education - 06/18/15 1435    Education provided Yes   Education Details added SAQ; LAQ; IT band stretch - conitnue stretching and strengthening    Person(s) Educated Patient   Methods Explanation;Demonstration;Tactile cues;Verbal cues;Handout   Comprehension Verbalized understanding;Returned demonstration;Verbal cues required;Tactile cues required          PT Short Term Goals - 06/02/15 1237    PT SHORT TERM GOAL #1   Title Patient I in initial HEP 06/30/15   Time 4   Period Weeks   Status New   PT SHORT TERM GOAL #2   Title Increase knee Rt ROM to -2 deg ext -  105 deg flex 06/30/15   Time 4   Period Weeks   Status New   PT SHORT TERM GOAL #3   Title Increase bilat strength hip extension 1/2 muscle grade 06/30/15   Time 4   Period Weeks   Status New   PT SHORT TERM GOAL #4   Title Increase standing tolerance to 15-20 min10/04/16   Time 4   Period Weeks   Status New   PT SHORT TERM GOAL #5   Title Normal gait pattern without limp Rt LE 07/14/15   Time 6   Period Weeks   Status New           PT Long Term Goals - 06/02/15 1240    PT LONG TERM GOAL #1   Title Patient I in HEP for discharge 08/04/15   Time 8   Period Weeks   Status New   PT LONG TERM GOAL #2   Title ROM Rt knee +/> than ROM Lt knee 08/04/15   Time 8   Period Weeks   Status New   PT LONG TERM GOAL #3   Title 5/5 strength bilat hip extension 08/04/15   Time 8   Period Weeks   Status New   PT LONG TERM GOAL #4   Title Patient to tolerate 2-3 hours sitting in preparation for return to work 08/04/15   Time 8   Period Weeks   Status New   PT LONG TERM GOAL #5   Title Decrease FOTO to </=41% limitation 08/04/15   Time 8   Period Weeks   Status New               Problem List Patient Active Problem List   Diagnosis Date Noted  . History of arthroplasty of right knee 05/08/2015  . Cough 04/14/2015  . Viral wart on right thumb 12/09/2014  . Plantar fasciitis, left 08/25/2014  . Fatty liver disease, nonalcoholic 06/17/2014  . Cholelithiases 06/17/2014  . History of kidney stones 05/20/2014  . Seborrheic keratosis 07/09/2013  . Cutaneous skin tags 07/09/2013  . Hyperlipidemia LDL goal < 100 11/29/2012  . Annual physical exam 07/17/2012  . 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 Obesity 07/17/2012  . Essential hypertension, benign 07/17/2012  . Right first Metacarpophalangeal joint pain 04/30/2012    Celyn Rober Minion PT, MPH 06/18/2015, 2:49 PM  [facility] [address] [place] [address]  [address] [place], [place], [address] Phone: [phone]   Fax:  [phone]

## 2015-06-18 NOTE — Patient Instructions (Signed)
Outer Hip Stretch: Reclined IT Band Stretch (Strap)   Strap around opposite foot, pull across only as far as possible with shoulders on mat. Hold for __30 sec Repeat __3__ times each leg.   Short Arc [employer] a large can or rolled towel under leg. Straighten knee and leg. Hold __5__ seconds.  Repeat __10__ times. 2-3 sets. Do __1-2 __ sessions per day.  Advanced Straight Leg Raise   With right knee bent, Keep left knee straight lift left leg 8-10 inches from surface.   keeping stomach tight. Hold 5-10 sec Repeat __5-10__ times per set. Do __1-3__ sets per session. Do _1-2___ sessions per day.

## 2015-06-23 ENCOUNTER — Ambulatory Visit (INDEPENDENT_AMBULATORY_CARE_PROVIDER_SITE_OTHER): Payer: BLUE CROSS/BLUE SHIELD | Admitting: Physical Therapy

## 2015-06-23 DIAGNOSIS — M25661 Stiffness of right knee, not elsewhere classified: Secondary | ICD-10-CM | POA: Diagnosis not present

## 2015-06-23 DIAGNOSIS — Z7409 Other reduced mobility: Secondary | ICD-10-CM

## 2015-06-23 DIAGNOSIS — M6289 Other specified disorders of muscle: Secondary | ICD-10-CM

## 2015-06-23 DIAGNOSIS — R269 Unspecified abnormalities of gait and mobility: Secondary | ICD-10-CM

## 2015-06-23 DIAGNOSIS — R531 Weakness: Secondary | ICD-10-CM

## 2015-06-23 DIAGNOSIS — R6889 Other general symptoms and signs: Secondary | ICD-10-CM

## 2015-06-23 DIAGNOSIS — R29898 Other symptoms and signs involving the musculoskeletal system: Secondary | ICD-10-CM

## 2015-06-23 NOTE — Therapy (Signed)
[facility] [place] [address] [place] [address] [place], [place], [address] Phone: [phone]   Fax:  [phone]  Physical Therapy Treatment  Patient Details  Name: Wanda Valencia MRN: [id number] Date of Birth: [birth year] Referring Provider:  Jodi Geralds, MD  Encounter Date: 06/23/2015      PT End of Session - 06/23/15 1108    Visit Number 7   Number of Visits 16   Date for PT Re-Evaluation 08/04/15   PT Start Time 1102   PT Stop Time 1200   PT Time Calculation (min) 58 min      Past Medical History  Diagnosis Date  . Incontinence   . 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 Hypertension   . Overactive bladder   . Kidney stones   . Hx: UTI (urinary tract infection)   . Family history of adverse reaction to anesthesia     "my sister has reactions to about anything; anesthesia included"  . Chronic bronchitis     "born w/it; get it alot"  . GERD (gastroesophageal reflux disease)   . Arthritis     "right knee and thumb" (05/08/2015)    Past Surgical History  Procedure Laterality Date  . Cesarean section  1989; 1991  . Cystoscopy w/ stone manipulation  09/2006  . Joint replacement    . 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 Tubal ligation  10/14/2011  . 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  Visit Diagnosis:  Stiffness of knee joint, right  Weakness of both hips  Abnormal gait  Decreased strength, endurance, and mobility      Subjective Assessment - 06/23/15 1108    Subjective Pt reports she still has difficulty descending steps, ascending stairs.  Getting out of tub also challenging.    Currently in Pain? No/denies            [facility] PT Assessment - 06/23/15 0001    Assessment   Medical Diagnosis Rt TKA   Onset  Date/Surgical Date 05/08/15   Hand Dominance Right   Next MD Visit 07/28/15   Prior Therapy home health 3-weeks   AROM   Left Knee Extension -7   Left Knee Flexion 116  119 after manual therapy           [facility] Adult PT Treatment/Exercise - 06/23/15 0001    Knee/Hip Exercises: Aerobic   Recumbent Bike L2: 5 min    Knee/Hip Exercises: Standing   Terminal Knee Extension Limitations green band in standing; 5 sec hold x 12 reps    Lateral Step Up 2 sets;10 reps;Hand Hold: 2;Left  Heel taps to 3" step   Step Down Left;2 sets;10 reps;Hand Hold: 2  3" step   Step Down Limitations (heel taps with Lt heel - challenging)   Knee/Hip Exercises: Supine   Quad Sets Right;1 set;5 reps   Modalities   Modalities Office manager Location Rt knee    Astronomer Stimulation Parameters to tolerance   Electrical Stimulation Goals Edema;Pain   Vasopneumatic   Number Minutes Vasopneumatic  15 minutes   Vasopnuematic Location  Knee   Vasopneumatic Pressure Medium   Vasopneumatic Temperature  3*   Manual Therapy   Manual Therapy  Joint mobilization   Soft tissue mobilization Rt quad and hamstring, Rt medial/lateral fat pad (instrument assisted) to promote tissue extensibility    Passive ROM Rt knee flexion/extension (overpressure)                  PT Short Term Goals - 06/02/15 1237    PT SHORT TERM GOAL #1   Title Patient I in initial HEP 06/30/15   Time 4   Period Weeks   Status New   PT SHORT TERM GOAL #2   Title Increase knee Rt ROM to -2 deg ext - 105 deg flex 06/30/15   Time 4   Period Weeks   Status New   PT SHORT TERM GOAL #3   Title Increase bilat strength hip extension 1/2 muscle grade 06/30/15   Time 4   Period Weeks   Status New   PT SHORT TERM GOAL #4   Title Increase standing tolerance to 15-20 min10/04/16   Time 4   Period Weeks   Status New    PT SHORT TERM GOAL #5   Title Normal gait pattern without limp Rt LE 07/14/15   Time 6   Period Weeks   Status New           PT Long Term Goals - 06/02/15 1240    PT LONG TERM GOAL #1   Title Patient I in HEP for discharge 08/04/15   Time 8   Period Weeks   Status New   PT LONG TERM GOAL #2   Title ROM Rt knee +/> than ROM Lt knee 08/04/15   Time 8   Period Weeks   Status New   PT LONG TERM GOAL #3   Title 5/5 strength bilat hip extension 08/04/15   Time 8   Period Weeks   Status New   PT LONG TERM GOAL #4   Title Patient to tolerate 2-3 hours sitting in preparation for return to work 08/04/15   Time 8   Period Weeks   Status New   PT LONG TERM GOAL #5   Title Decrease FOTO to </=41% limitation 08/04/15   Time 8   Period Weeks   Status New               Plan - 06/23/15 1310    Clinical Impression Statement Pt demo improved Rt knee ROM (up to 119 flex after manual therapy); slow progress with Rt knee extension ROM.  Pt continues with Rt quad eccentric control weakness. Will benefit from cont PT intervention to maximize function.    Pt will benefit from skilled therapeutic intervention in order to improve on the following deficits Decreased range of motion;Decreased strength;Abnormal gait;Pain;Decreased activity tolerance;Decreased endurance;Obesity   Rehab Potential Good   PT Frequency 2x / week   PT Duration 8 weeks   PT Treatment/Interventions Patient/family education;ADLs/Self Care Home Management;Therapeutic exercise;Therapeutic activities;Scar mobilization;Dry needling;Cryotherapy;Electrical Stimulation;Moist Heat;Ultrasound;Manual techniques   PT Next Visit Plan  progress with Rt knee ROM/strengthening. (pt holding treatment for 1 wk due to daughters wedding; will return after that)   Consulted and Agree with Plan of Care Patient        Problem List Patient Active Problem List   Diagnosis Date Noted  . History of arthroplasty of right knee  05/08/2015  . Cough 04/14/2015  . Viral wart on right thumb 12/09/2014  . Plantar fasciitis, left 08/25/2014  . Fatty liver disease, nonalcoholic 06/17/2014  . Cholelithiases 06/17/2014  . History of kidney stones 05/20/2014  .  Seborrheic keratosis 07/09/2013  . Cutaneous skin tags 07/09/2013  . Hyperlipidemia LDL goal < 100 11/29/2012  . Annual physical exam 07/17/2012  . 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 Obesity 07/17/2012  . Essential hypertension, benign 07/17/2012  . Right first Metacarpophalangeal joint pain 04/30/2012    Mayer Camel, PTA 06/23/2015 1:12 PM  [facility] [address] [place] [address] [place], [place], [address] Phone: [phone]   Fax:  [phone]

## 2015-06-25 ENCOUNTER — Encounter: Payer: BLUE CROSS/BLUE SHIELD | Admitting: Rehabilitative and Restorative Service Providers"

## 2015-07-08 ENCOUNTER — Ambulatory Visit (INDEPENDENT_AMBULATORY_CARE_PROVIDER_SITE_OTHER): Payer: BLUE CROSS/BLUE SHIELD | Admitting: Physical Therapy

## 2015-07-08 DIAGNOSIS — M25661 Stiffness of right knee, not elsewhere classified: Secondary | ICD-10-CM | POA: Diagnosis not present

## 2015-07-08 DIAGNOSIS — M6289 Other specified disorders of muscle: Secondary | ICD-10-CM | POA: Diagnosis not present

## 2015-07-08 DIAGNOSIS — R269 Unspecified abnormalities of gait and mobility: Secondary | ICD-10-CM | POA: Diagnosis not present

## 2015-07-08 DIAGNOSIS — R29898 Other symptoms and signs involving the musculoskeletal system: Secondary | ICD-10-CM

## 2015-07-08 DIAGNOSIS — Z7409 Other reduced mobility: Secondary | ICD-10-CM

## 2015-07-08 DIAGNOSIS — R531 Weakness: Secondary | ICD-10-CM

## 2015-07-08 DIAGNOSIS — R6889 Other general symptoms and signs: Secondary | ICD-10-CM

## 2015-07-08 NOTE — Therapy (Signed)
[place], [place], [address] Phone: [phone]   Fax:  [phone]  Physical Therapy Treatment  Patient Details  Name: Wanda Valencia MRN: [id number] Date of Birth: 03-28-1966 Referring Provider:  Dorna Leitz, MD  Encounter Date: 07/08/2015      PT End of Session - 07/08/15 1408    Visit Number 8   Number of Visits 16   Date for PT Re-Evaluation 08/04/15   PT Start Time 1406   PT Stop Time 1455   PT Time Calculation (min) 49 min   Activity Tolerance Patient tolerated treatment well      Past Medical History  Diagnosis Date  . Incontinence   . 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 Hypertension   . Overactive bladder   . Kidney stones   . Hx: UTI (urinary tract infection)   . Family history of adverse reaction to anesthesia     "my sister has reactions to about anything; anesthesia included"  . Chronic bronchitis     "born w/it; get it alot"  . GERD (gastroesophageal reflux disease)   . Arthritis     "right knee and thumb" (05/08/2015)    Past Surgical History  Procedure Laterality Date  . Cesarean section  1989; 1991  . Cystoscopy w/ stone manipulation  09/2006  . Joint replacement    . 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 Tubal ligation  10/14/2011  . Total knee arthroplasty Right 05/08/2015    Procedure: TOTAL KNEE ARTHROPLASTY;  Surgeon: Dorna Leitz, MD;  Location: [facility];  Service: Orthopedics;  Laterality: Right;    There were no vitals filed for this visit.  Visit Diagnosis:  Stiffness of knee joint, right  Weakness of both hips  Abnormal gait  Decreased strength, endurance, and mobility      Subjective Assessment - 07/08/15 1407    Subjective Pt reports she has been busy with her daughters wedding and then her husband was on vacation.  Hasn't been able to perform all her exercise.    Currently in Pain? Yes   Pain Score 6    Pain Location  Calf   Pain Orientation Right   Pain Descriptors / Indicators Aching;Dull;Tightness   Pain Type Acute pain   Aggravating Factors  at night had the calf pain   Pain Relieving Factors medicine - muscle relaxer            [facility] PT Assessment - 07/08/15 0001    Assessment   Medical Diagnosis Rt TKA   Onset Date/Surgical Date 05/08/15   Next MD Visit 07/28/15   AROM   Left Knee Extension -3   Left Knee Flexion 118   Strength   Right/Left Hip Right   Right Hip Flexion 5/5   Right Hip Extension 5/5   Right Hip ABduction 5/5                     [facility] Adult PT Treatment/Exercise - 07/08/15 0001    Knee/Hip Exercises: Stretches   Sports administrator Right;2 reps;30 seconds  prone with strap   Gastroc Stretch Right;2 reps;30 seconds  focus on pressing heel into floor and knee straight   Knee/Hip Exercises: Aerobic   Recumbent Bike L2: 5 min    Knee/Hip Exercises: Supine   Quad Sets Strengthening;Right;10 reps  5 sec   Modalities   Modalities Electrical Stimulation;Vasopneumatic;Ultrasound;Moist Heat   Moist Heat Therapy   Number Minutes  Moist Heat 15 Minutes   Moist Heat Location --  Rt calf   Electrical Stimulation   Electrical Stimulation Location Rt knee    Electrical Stimulation Action ion repelling   Electrical Stimulation Parameters to tolerance   Electrical Stimulation Goals Edema   Ultrasound   Ultrasound Location posterior Rt knee   Ultrasound Parameters 100%, 1.0 mHz, 1.5 w/cm2   Ultrasound Goals Pain  tightness   Vasopneumatic   Number Minutes Vasopneumatic  15 minutes   Vasopnuematic Location  Knee   Vasopneumatic Pressure Medium   Vasopneumatic Temperature  3*          Trigger Point Dry Needling - 07/08/15 1452    Consent Given? Yes   Education Handout Provided Yes   Muscles Treated Lower Body Gastrocnemius  Rt   Gastrocnemius Response Twitch response elicited                PT Short Term Goals - 07/08/15 1416    PT SHORT TERM  GOAL #1   Title Patient I in initial HEP 06/30/15   Status Achieved   PT SHORT TERM GOAL #2   Title Increase knee Rt ROM to -2 deg ext - 105 deg flex 06/30/15   Status Partially Met   PT SHORT TERM GOAL #3   Title Increase bilat strength hip extension 1/2 muscle grade 06/30/15   Status Achieved   PT SHORT TERM GOAL #4   Title Increase standing tolerance to 15-20 min10/04/16   Status Achieved   PT SHORT TERM GOAL #5   Title Normal gait pattern without limp Rt LE 07/14/15   Status On-going           PT Long Term Goals - 07/08/15 1420    PT LONG TERM GOAL #1   Title Patient I in HEP for discharge 08/04/15   Status On-going   PT LONG TERM GOAL #2   Title ROM Rt knee +/> than ROM Lt knee 08/04/15   Status On-going   PT LONG TERM GOAL #3   Title 5/5 strength bilat hip extension 08/04/15   Status Achieved   PT LONG TERM GOAL #4   Title Patient to tolerate 2-3 hours sitting in preparation for return to work 08/04/15   Status On-going  tolerates about 30'   PT LONG TERM GOAL #5   Title Decrease FOTO to </=41% limitation 08/04/15   Status On-going               Plan - 07/08/15 1641    Clinical Impression Statement Pt continues to make small improvements in Rt knee ROM, strength is also improving.  Her biggest compliant currently is pain in her Rt calf that is interfering with her ability to rest.     Pt will benefit from skilled therapeutic intervention in order to improve on the following deficits Decreased range of motion;Decreased strength;Abnormal gait;Pain;Decreased activity tolerance;Decreased endurance;Obesity   Rehab Potential Good   PT Frequency 2x / week   PT Duration 8 weeks   PT Treatment/Interventions Patient/family education;ADLs/Self Care Home Management;Therapeutic exercise;Therapeutic activities;Scar mobilization;Dry needling;Cryotherapy;Electrical Stimulation;Moist Heat;Ultrasound;Manual techniques   PT Next Visit Plan assess response to TDN and  ultrasound   Consulted and Agree with Plan of Care Patient        Problem List Patient Active Problem List   Diagnosis Date Noted  . History of arthroplasty of right knee 05/08/2015  . Cough 04/14/2015  . Viral wart on right thumb 12/09/2014  . Plantar fasciitis, left 08/25/2014  .  Fatty liver disease, nonalcoholic 37/90/2409  . Cholelithiases 06/17/2014  . History of kidney stones 05/20/2014  . Seborrheic keratosis 07/09/2013  . Cutaneous skin tags 07/09/2013  . Hyperlipidemia LDL goal < 100 11/29/2012  . Annual physical exam 07/17/2012  . 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 Obesity 07/17/2012  . Essential hypertension, benign 07/17/2012  . Right first Metacarpophalangeal joint pain 04/30/2012    Jeral Pinch PT 07/08/2015, 4:45 PM  [facility] [place], [place], [address] Phone: [phone]   Fax:  [phone]

## 2015-07-08 NOTE — Patient Instructions (Signed)

## 2015-07-10 ENCOUNTER — Ambulatory Visit (INDEPENDENT_AMBULATORY_CARE_PROVIDER_SITE_OTHER): Payer: BLUE CROSS/BLUE SHIELD | Admitting: Physical Therapy

## 2015-07-10 DIAGNOSIS — M6289 Other specified disorders of muscle: Secondary | ICD-10-CM

## 2015-07-10 DIAGNOSIS — Z7409 Other reduced mobility: Secondary | ICD-10-CM

## 2015-07-10 DIAGNOSIS — M25661 Stiffness of right knee, not elsewhere classified: Secondary | ICD-10-CM | POA: Diagnosis not present

## 2015-07-10 DIAGNOSIS — R531 Weakness: Secondary | ICD-10-CM

## 2015-07-10 DIAGNOSIS — R29898 Other symptoms and signs involving the musculoskeletal system: Secondary | ICD-10-CM

## 2015-07-10 DIAGNOSIS — R6889 Other general symptoms and signs: Secondary | ICD-10-CM

## 2015-07-10 NOTE — Therapy (Signed)
[place], [place], [address] Phone: [phone]   Fax:  [phone]  Physical Therapy Treatment  Patient Details  Name: Wanda Valencia MRN: [id number] Date of Birth: 1966-03-14 No Data Recorded  Encounter Date: 07/10/2015      PT End of Session - 07/10/15 1407    Visit Number 9   Number of Visits 16   Date for PT Re-Evaluation 08/04/15   PT Start Time 8786   PT Stop Time 1450   PT Time Calculation (min) 53 min   Activity Tolerance Patient tolerated treatment well      Past Medical History  Diagnosis Date  . Incontinence   . 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 Hypertension   . Overactive bladder   . Kidney stones   . Hx: UTI (urinary tract infection)   . Family history of adverse reaction to anesthesia     "my sister has reactions to about anything; anesthesia included"  . Chronic bronchitis     "born w/it; get it alot"  . GERD (gastroesophageal reflux disease)   . Arthritis     "right knee and thumb" (05/08/2015)    Past Surgical History  Procedure Laterality Date  . Cesarean section  1989; 1991  . Cystoscopy w/ stone manipulation  09/2006  . Joint replacement    . 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 Tubal ligation  10/14/2011  . Total knee arthroplasty Right 05/08/2015    Procedure: TOTAL KNEE ARTHROPLASTY;  Surgeon: Dorna Leitz, MD;  Location: [facility];  Service: Orthopedics;  Laterality: Right;    There were no vitals filed for this visit.  Visit Diagnosis:  Stiffness of knee joint, right  Weakness of both hips  Decreased strength, endurance, and mobility      Subjective Assessment - 07/10/15 1410    Subjective Pt reports she was sore the night of last treatment.  Noted a little bit of bruising following day, but has been able to sleep "pretty descent" last two nights.    Currently in Pain? No/denies            [facility] PT Assessment - 07/10/15  0001    Assessment   Medical Diagnosis Rt TKA   Onset Date/Surgical Date 05/08/15   Hand Dominance Right   Next MD Visit 07/28/15   Prior Therapy home health 3-weeks   AROM   Right/Left Knee Right   Left Knee Extension -3   Left Knee Flexion 123           [facility] Adult PT Treatment/Exercise - 07/10/15 0001    Knee/Hip Exercises: Stretches   Passive Hamstring Stretch 3 reps;30 seconds   Quad Stretch Right;5 reps;30 seconds   Gastroc Stretch Right;2 reps;30 seconds  focus on pressing heel into floor and knee straight   Knee/Hip Exercises: Aerobic   Recumbent Bike L2: 5 min    Knee/Hip Exercises: Standing   Lateral Step Up 1 set;10 reps;Hand Hold: 2  3" step    Lateral Step Up Limitations (heel taps)   Step Down 1 set;10 reps  3" step   Step Down Limitations (heel taps)   SLS SLS with Rt  on blue pad x 20 sec x 3 reps;  Rt SLS with forward leans to reaching to13" step x 10 reps    Knee/Hip Exercises: Supine   Quad Sets Strengthening;Right;10 reps  5 sec   Heel Slides AAROM;Right;1 set;5 reps  with strap  to increased knee ROM   Straight Leg Raise with External Rotation Right;1 set;10 reps  (with hip abd/add)   Knee/Hip Exercises: Prone   Hamstring Curl 2 sets;10 reps   Hamstring Curl Limitations 2nd set with 3# at ankle    Prone Knee Hang 2 minutes   Electrical Stimulation   Electrical Stimulation Location Rt knee    Electrical Stimulation Action ion repelling    Electrical Stimulation Parameters to tolerance    Electrical Stimulation Goals Edema   Vasopneumatic   Number Minutes Vasopneumatic  15 minutes   Vasopnuematic Location  Knee   Vasopneumatic Pressure Medium   Vasopneumatic Temperature  3*   Manual Therapy   Manual Therapy Taping   Manual therapy comments Ktape applied in web pattern over Rt ant knee to decrease edema/ pain.    Passive ROM Rt knee ext overpressure to tolerance x 15 sec x 5 reps                   PT Short Term Goals - 07/08/15  1416    PT SHORT TERM GOAL #1   Title Patient I in initial HEP 06/30/15   Status Achieved   PT SHORT TERM GOAL #2   Title Increase knee Rt ROM to -2 deg ext - 105 deg flex 06/30/15   Status Partially Met   PT SHORT TERM GOAL #3   Title Increase bilat strength hip extension 1/2 muscle grade 06/30/15   Status Achieved   PT SHORT TERM GOAL #4   Title Increase standing tolerance to 15-20 min10/04/16   Status Achieved   PT SHORT TERM GOAL #5   Title Normal gait pattern without limp Rt LE 07/14/15   Status On-going           PT Long Term Goals - 07/08/15 1420    PT LONG TERM GOAL #1   Title Patient I in HEP for discharge 08/04/15   Status On-going   PT LONG TERM GOAL #2   Title ROM Rt knee +/> than ROM Lt knee 08/04/15   Status On-going   PT LONG TERM GOAL #3   Title 5/5 strength bilat hip extension 08/04/15   Status Achieved   PT LONG TERM GOAL #4   Title Patient to tolerate 2-3 hours sitting in preparation for return to work 08/04/15   Status On-going  tolerates about 30'   PT LONG TERM GOAL #5   Title Decrease FOTO to </=41% limitation 08/04/15   Status On-going               Plan - 07/10/15 1444    Clinical Impression Statement Pt demo increase in Rt knee flexion ROM. Pt continues with difficulty/weakness in eccentric quad exercises.  Pt tolerated all exercises with minimal increase in pain. Progressing towards goals.    Pt will benefit from skilled therapeutic intervention in order to improve on the following deficits Decreased range of motion;Decreased strength;Abnormal gait;Pain;Decreased activity tolerance;Decreased endurance;Obesity   Rehab Potential Good   PT Frequency 2x / week   PT Duration 8 weeks   PT Treatment/Interventions Patient/family education;ADLs/Self Care Home Management;Therapeutic exercise;Therapeutic activities;Scar mobilization;Dry needling;Cryotherapy;Electrical Stimulation;Moist Heat;Ultrasound;Manual techniques   PT Next Visit Plan  Assess response to ktape.  Progress HEP.    PT Home Exercise Plan forward lean in SLS/  3" Lt heel taps   Consulted and Agree with Plan of Care Patient        Problem List Patient Active Problem List   Diagnosis Date  Noted  . History of arthroplasty of right knee 05/08/2015  . Cough 04/14/2015  . Viral wart on right thumb 12/09/2014  . Plantar fasciitis, left 08/25/2014  . Fatty liver disease, nonalcoholic 91/10/8900  . Cholelithiases 06/17/2014  . History of kidney stones 05/20/2014  . Seborrheic keratosis 07/09/2013  . Cutaneous skin tags 07/09/2013  . Hyperlipidemia LDL goal < 100 11/29/2012  . Annual physical exam 07/17/2012  . 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 Obesity 07/17/2012  . Essential hypertension, benign 07/17/2012  . Right first Metacarpophalangeal joint pain 04/30/2012   Kerin Perna, PTA 07/10/2015 4:38 PM  [place], [place], [address] Phone: [phone]   Fax:  [phone]  Name: Wanda Valencia MRN: [id number] Date of Birth: 10-09-1965

## 2015-07-15 ENCOUNTER — Ambulatory Visit (INDEPENDENT_AMBULATORY_CARE_PROVIDER_SITE_OTHER): Payer: BLUE CROSS/BLUE SHIELD | Admitting: Physical Therapy

## 2015-07-15 DIAGNOSIS — R6889 Other general symptoms and signs: Secondary | ICD-10-CM

## 2015-07-15 DIAGNOSIS — M25661 Stiffness of right knee, not elsewhere classified: Secondary | ICD-10-CM | POA: Diagnosis not present

## 2015-07-15 DIAGNOSIS — R531 Weakness: Secondary | ICD-10-CM

## 2015-07-15 DIAGNOSIS — Z7409 Other reduced mobility: Secondary | ICD-10-CM

## 2015-07-15 DIAGNOSIS — M6289 Other specified disorders of muscle: Secondary | ICD-10-CM | POA: Diagnosis not present

## 2015-07-15 DIAGNOSIS — R29898 Other symptoms and signs involving the musculoskeletal system: Secondary | ICD-10-CM

## 2015-07-15 NOTE — Therapy (Addendum)
[place], [place], [address] Phone: [phone]   Fax:  [phone]  Physical Therapy Treatment  Patient Details  Name: Wanda Valencia MRN: [id number] Date of Birth: 01-01-1966 No Data Recorded  Encounter Date: 07/15/2015      PT End of Session - 07/15/15 1407    Visit Number 10   Number of Visits 16   Date for PT Re-Evaluation 08/04/15   PT Start Time 5366   PT Stop Time 1448   PT Time Calculation (min) 45 min      Past Medical History  Diagnosis Date  . Incontinence   . 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 Hypertension   . Overactive bladder   . Kidney stones   . Hx: UTI (urinary tract infection)   . Family history of adverse reaction to anesthesia     "my sister has reactions to about anything; anesthesia included"  . Chronic bronchitis     "born w/it; get it alot"  . GERD (gastroesophageal reflux disease)   . Arthritis     "right knee and thumb" (05/08/2015)    Past Surgical History  Procedure Laterality Date  . Cesarean section  1989; 1991  . Cystoscopy w/ stone manipulation  09/2006  . Joint replacement    . 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 Tubal ligation  10/14/2011  . Total knee arthroplasty Right 05/08/2015    Procedure: TOTAL KNEE ARTHROPLASTY;  Surgeon: Dorna Leitz, MD;  Location: [facility];  Service: Orthopedics;  Laterality: Right;    There were no vitals filed for this visit.  Visit Diagnosis:  Stiffness of knee joint, right  Weakness of both hips  Decreased strength, endurance, and mobility      Subjective Assessment - 07/15/15 1408    Subjective Pt reports she was busy over wkend; had burning in Rt calf and hamstring (distal).  No noticable difference with tape on.  Pt feels going down steps is getting a little easier.    Currently in Pain? No/denies            [facility] PT Assessment - 07/15/15 0001    Assessment   Medical  Diagnosis Rt TKA   Onset Date/Surgical Date 05/08/15   Hand Dominance Right   Next MD Visit 07/28/15   AROM   Left Knee Extension -4   Left Knee Flexion 123          [facility] Adult PT Treatment/Exercise - 07/15/15 0001    Knee/Hip Exercises: Stretches   Passive Hamstring Stretch Right;2 reps;30 seconds   Gastroc Stretch Right;2 reps;20 seconds   Soleus Stretch Right;2 reps;20 seconds   Other Knee/Hip Stretches Lunge on 13" step, followed by hamstring stretch (each position held 15 sec x 5 reps) to increase Rt knee ROM.    Knee/Hip Exercises: Aerobic   Elliptical L2: 3 min   Knee/Hip Exercises: Standing   Lateral Step Up Right;2 sets;10 reps;Hand Hold: 1  onto Bosu, pause at top for 3 sec for balance   Forward Step Up 2 sets;Right;Hand Hold: 1;10 reps  onto Bosu   Step Down 1 set;10 reps  3" step   Step Down Limitations (heel taps)   SLS  Rt SLS with forward leans to reaching to13" step x 10 reps (tremulous)   Modalities   Modalities Electrical Stimulation   Electrical Stimulation   Electrical Stimulation Location Rt knee    Electrical Stimulation Action ion repelling  Electrical Stimulation Parameters to tolerance   Electrical Stimulation Goals Edema   Vasopneumatic   Number Minutes Vasopneumatic  15 minutes   Vasopnuematic Location  Knee   Vasopneumatic Pressure Medium   Vasopneumatic Temperature  3*                  PT Short Term Goals - 07/08/15 1416    PT SHORT TERM GOAL #1   Title Patient I in initial HEP 06/30/15   Status Achieved   PT SHORT TERM GOAL #2   Title Increase knee Rt ROM to -2 deg ext - 105 deg flex 06/30/15   Status Partially Met   PT SHORT TERM GOAL #3   Title Increase bilat strength hip extension 1/2 muscle grade 06/30/15   Status Achieved   PT SHORT TERM GOAL #4   Title Increase standing tolerance to 15-20 min10/04/16   Status Achieved   PT SHORT TERM GOAL #5   Title Normal gait pattern without limp Rt LE 07/14/15   Status  On-going           PT Long Term Goals - 07/08/15 1420    PT LONG TERM GOAL #1   Title Patient I in HEP for discharge 08/04/15   Status On-going   PT LONG TERM GOAL #2   Title ROM Rt knee +/> than ROM Lt knee 08/04/15   Status On-going   PT LONG TERM GOAL #3   Title 5/5 strength bilat hip extension 08/04/15   Status Achieved   PT LONG TERM GOAL #4   Title Patient to tolerate 2-3 hours sitting in preparation for return to work 08/04/15   Status On-going  tolerates about 30'   PT LONG TERM GOAL #5   Title Decrease FOTO to </=41% limitation 08/04/15   Status On-going               Plan - 07/15/15 1457    Clinical Impression Statement Pt's ROM in Rt knee remains similar to last session.  Pt challenged with eccentric Rt quad and hamstring exercises.  Progressing towards established goals.    Pt will benefit from skilled therapeutic intervention in order to improve on the following deficits Decreased range of motion;Decreased strength;Abnormal gait;Pain;Decreased activity tolerance;Decreased endurance;Obesity   Rehab Potential Good   PT Frequency 2x / week   PT Duration 8 weeks   PT Treatment/Interventions Patient/family education;ADLs/Self Care Home Management;Therapeutic exercise;Therapeutic activities;Scar mobilization;Dry needling;Cryotherapy;Electrical Stimulation;Moist Heat;Ultrasound;Manual techniques   PT Next Visit Plan Continue progressive strengthening/ROM for Rt knee.    Consulted and Agree with Plan of Care Patient        Problem List Patient Active Problem List   Diagnosis Date Noted  . History of arthroplasty of right knee 05/08/2015  . Cough 04/14/2015  . Viral wart on right thumb 12/09/2014  . Plantar fasciitis, left 08/25/2014  . Fatty liver disease, nonalcoholic 36/46/8032  . Cholelithiases 06/17/2014  . History of kidney stones 05/20/2014  . Seborrheic keratosis 07/09/2013  . Cutaneous skin tags 07/09/2013  . Hyperlipidemia LDL goal < 100  11/29/2012  . Annual physical exam 07/17/2012  . 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 Obesity 07/17/2012  . Essential hypertension, benign 07/17/2012  . Right first Metacarpophalangeal joint pain 04/30/2012    Kerin Perna, PTA 07/15/2015 3:05 PM  [place], [place], [address] Phone: [phone]   Fax:  [phone]  Name: Wanda Valencia MRN: [id number] Date of Birth: Jun 20, 1966  PHYSICAL THERAPY DISCHARGE SUMMARY  Visits from Start of Care: 10  Current functional level related to goals / functional outcomes: I in HEP goals are partially accomplished   Remaining deficits: Stiffness/continued tightness end range knee extension in the last few degrees   Education / Equipment: HEP  Plan: Patient agrees to discharge.  Patient goals were partially met. Patient is being discharged due to being pleased with the current functional level.  ?????    Celyn P. Helene Kelp PT, MPH 07/28/2015 10:52 AM

## 2015-07-17 ENCOUNTER — Ambulatory Visit (INDEPENDENT_AMBULATORY_CARE_PROVIDER_SITE_OTHER): Payer: BLUE CROSS/BLUE SHIELD | Admitting: Family Medicine

## 2015-07-17 ENCOUNTER — Encounter: Payer: BLUE CROSS/BLUE SHIELD | Admitting: Physical Therapy

## 2015-07-17 ENCOUNTER — Encounter: Payer: Self-pay | Admitting: Family Medicine

## 2015-07-17 VITALS — BP 97/65 | HR 81 | Wt 221.0 lb

## 2015-07-17 DIAGNOSIS — N390 Urinary tract infection, site not specified: Secondary | ICD-10-CM

## 2015-07-17 LAB — POCT URINALYSIS DIPSTICK
Bilirubin, UA: NEGATIVE
Blood, UA: NEGATIVE
GLUCOSE UA: NEGATIVE
Ketones, UA: NEGATIVE
Nitrite, UA: NEGATIVE
PROTEIN UA: NEGATIVE
Spec Grav, UA: 1.025
Urobilinogen, UA: 0.2
pH, UA: 6

## 2015-07-17 MED ORDER — CEFDINIR 250 MG/5ML PO SUSR: ORAL | Status: DC

## 2015-07-17 NOTE — Progress Notes (Signed)
CC: Wanda Valencia is a 49 y.o. female is here for Urinary Tract Infection   Subjective: HPI:  Urinary frequency, urgency, and low back pain present for the past 5 days. Accompanied by chills this morning. Accompanied by foul odor to urine. Interventions have included cranberry tablets. Nothing seems to make symptoms better or worse. Symptoms are mild to moderate severity present all hours of the day and not interfering with sleep. She denies abdominal pain, nausea, vomiting, confusion or fever   Review Of Systems Outlined In HPI  Past Medical History  Diagnosis Date  . Incontinence   . 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 Hypertension   . Overactive bladder   . Kidney stones   . Hx: UTI (urinary tract infection)   . Family history of adverse reaction to anesthesia     "my sister has reactions to about anything; anesthesia included"  . Chronic bronchitis (HCC)     "born w/it; get it alot"  . GERD (gastroesophageal reflux disease)   . Arthritis     "right knee and thumb" (05/08/2015)    Past Surgical History  Procedure Laterality Date  . Cesarean section  1989; 1991  . Cystoscopy w/ stone manipulation  09/2006  . Joint replacement    . 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 Tubal ligation  10/14/2011  . 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 Hypertension Mother   . Depression Mother   . Hypertension Father   . Cancer Father     tongue/kidney    Social History   Social History  . Marital Status: Married    Spouse Name: N/A  . Number of Children: N/A  . Years of Education: N/A   Occupational History  . Not on file.   Social History Main Topics  . Smoking status: Former Smoker -- 0.10 packs/day for 2 years    Types: Cigarettes    Quit date: 09/27/1983  . Smokeless  tobacco: Never Used  . Alcohol Use: Yes     Comment: 05/08/2015 "a few drinks/year"  . Drug Use: No  . Sexual Activity: Yes   Other Topics Concern  . Not on file   Social History Narrative     Objective: BP 97/65 mmHg  Pulse 81  Wt 221 lb (100.245 kg)  LMP 09/29/2011  Vital signs reviewed. General: Alert and Oriented, No Acute Distress HEENT: Pupils equal, round, reactive to light. Conjunctivae clear.  External ears unremarkable.  Moist mucous membranes. Lungs: Clear and comfortable work of breathing, speaking in full sentences without accessory muscle use. Cardiac: Regular rate and rhythm.  Neuro: CN II-XII grossly intact, gait normal. Extremities: No peripheral edema.  Strong peripheral pulses.  Mental Status: No depression, anxiety, nor agitation. Logical though process. Skin: Warm and dry.  Assessment & Plan: Wanda Valencia was seen today for urinary tract infection.  Diagnoses and all orders for this visit:  Urinary tract infection without hematuria, site unspecified -     POCT Urinalysis Dipstick -     cefdinir (OMNICEF) 250 MG/5ML suspension; 6mL twice a day   Symptoms suggestive of UTI and although urinalysis is inconclusive I would like her to start on Omnicef, she cannot take tablets due to fear of choking which has influenced antibiotic selection today.   Return if  symptoms worsen or fail to improve.

## 2015-09-13 ENCOUNTER — Encounter: Payer: Self-pay | Admitting: Emergency Medicine

## 2015-09-13 ENCOUNTER — Emergency Department (INDEPENDENT_AMBULATORY_CARE_PROVIDER_SITE_OTHER)
Admission: EM | Admit: 2015-09-13 | Discharge: 2015-09-13 | Disposition: A | Discharge status: Home / Self Care | Payer: BLUE CROSS/BLUE SHIELD | Source: Home / Self Care | Attending: Family Medicine | Admitting: Family Medicine

## 2015-09-13 DIAGNOSIS — J0100 Acute maxillary sinusitis, unspecified: Secondary | ICD-10-CM

## 2015-09-13 MED ORDER — AMOXICILLIN-POT CLAVULANATE 875-125 MG PO TABS: 1.0000 | ORAL_TABLET | Freq: Two times a day (BID) | ORAL | Status: DC

## 2015-09-13 MED ORDER — FLUCONAZOLE 150 MG PO TABS: 150.0000 mg | ORAL_TABLET | Freq: Once | ORAL | Status: DC

## 2015-09-13 NOTE — ED Notes (Signed)
Pt c/o nasal congestion and bi lateral ear pain x2 days. States she has had some yellow bloody nasal secreations.

## 2015-09-13 NOTE — ED Provider Notes (Signed)
CSN: [id number]     Arrival date & time 09/13/15  1544 History   First MD Initiated Contact with Patient 09/13/15 1549     Chief Complaint  Patient presents with  . Facial Pain   (Consider location/radiation/quality/duration/timing/severity/associated sxs/prior Treatment) HPI  Pt is a 49yo female presenting to [facility] with c/o worsening nasal congestion with yellow bloody mucous, associated facial pain and bilateral ear pain for 2 days. Headache is sore, worse when leaning over to tie her shoes.  She has tried dayquil and nyquil with minimal relief.  Pt states her grandson has pneumonia and the lady who did her nails the other day was also sick. Pt developed 24 hours after getting her nails done.   Past Medical History  Diagnosis Date  . Incontinence   . 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 Hypertension   . Overactive bladder   . Kidney stones   . Hx: UTI (urinary tract infection)   . Family history of adverse reaction to anesthesia     "my sister has reactions to about anything; anesthesia included"  . Chronic bronchitis (HCC)     "born w/it; get it alot"  . GERD (gastroesophageal reflux disease)   . Arthritis     "right knee and thumb" (05/08/2015)   Past Surgical History  Procedure Laterality Date  . Cesarean section  1989; 1991  . Cystoscopy w/ stone manipulation  09/2006  . Joint replacement    . 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 Tubal ligation  10/14/2011  . 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 Hypertension Mother   . Depression Mother   . Hypertension Father   . Cancer Father     tongue/kidney   Social History  Substance Use Topics  . Smoking status: Former Smoker -- 0.10 packs/day for 2 years    Types: Cigarettes    Quit date: 09/27/1983  . Smokeless tobacco:  Never Used  . Alcohol Use: Yes     Comment: 05/08/2015 "a few drinks/year"   OB History    No data available     Review of Systems  Constitutional: Positive for fever ( subjective) and chills.  HENT: Positive for congestion, postnasal drip, rhinorrhea, sinus pressure and sore throat. Negative for ear pain, trouble swallowing and voice change.   Respiratory: Negative for cough and shortness of breath.   Cardiovascular: Negative for chest pain and palpitations.  Gastrointestinal: Negative for nausea, vomiting, abdominal pain and diarrhea.  Musculoskeletal: Negative for myalgias, back pain and arthralgias.  Skin: Negative for rash.  Neurological: Positive for headaches. Negative for dizziness and light-headedness.  All other systems reviewed and are negative.   Allergies  Review of patient's allergies indicates no known allergies.  Home Medications   Prior to Admission medications   Medication Sig Start Date End Date Taking? Authorizing Provider  amoxicillin-clavulanate (AUGMENTIN) 875-125 MG tablet Take 1 tablet by mouth 2 (two) times daily. One po bid x 10 days 09/13/15   Glenisha Gundry O'Malley, PA-C  aspirin 81 MG tablet Take 81 mg by mouth daily.    Historical Provider, MD  cefdinir (OMNICEF) 250 MG/5ML suspension [id number] [place]Alan [id number] HomesAlan [id number] [place]Alan [id number] [id number] VAlan [id number] EvergrAlan [id number] ThAlan [id number] [place] HAlan [id number] [place]Alan [id number] [place]Alan [id number] SAlan [id number] [place]Alan [id number] GalAlan [phone] Mu1/16   Sean Hommel, DO  CRANBERRY PO Take 1 capsule by mouth.     Historical Provider, MD  FIBER  PO Take 2 capsules by mouth daily. Chewable    Historical Provider, MD  fluconazole (DIFLUCAN) 150 MG tablet Take 1 tablet (150 mg total) by mouth once. May repeat after 3 days if symptoms still present 09/13/15   Junius Finner, PA-C  lisinopril-hydrochlorothiazide (PRINZIDE,ZESTORETIC) 20-25 MG per tablet TAKE 1 TABLET BY MOUTH DAILY. 05/28/15   Monica Becton, MD  methocarbamol (ROBAXIN-750) 750 MG tablet Take 1 tablet (750 mg total) by mouth every 8 (eight) hours as needed for muscle spasms. 05/08/15   Marshia Ly, PA-C  ranitidine (ZANTAC) 150 MG tablet  Take 150 mg by mouth daily.     Historical Provider, MD   Meds Ordered and Administered this Visit  Medications - No data to display  BP 111/70 mmHg  Pulse 81  Temp(Src) 98 F (36.7 C) (Oral)  Wt 221 lb (100.245 kg)  SpO2 97%  LMP 09/29/2011 No data found.   Physical Exam  Constitutional: She is oriented to person, place, and time. She appears well-developed and well-nourished.  HENT:  Head: Normocephalic and atraumatic.  Right Ear: Hearing, tympanic membrane, external ear and ear canal normal.  Left Ear: Hearing, tympanic membrane, external ear and ear canal normal.  Nose: Mucosal edema present. Right sinus exhibits maxillary sinus tenderness. Right sinus exhibits no frontal sinus tenderness. Left sinus exhibits maxillary sinus tenderness. Left sinus exhibits no frontal sinus tenderness.  Mouth/Throat: Uvula is midline, oropharynx is clear and moist and mucous membranes are normal.  Eyes: EOM are normal.  Neck: Normal range of motion. Neck supple.  Cardiovascular: Normal rate, regular rhythm and normal heart sounds.   Pulmonary/Chest: Effort normal. No stridor. No respiratory distress. She has no wheezes. She has no rales. She exhibits no tenderness.  Abdominal: Soft. She exhibits no distension. There is no tenderness.  Musculoskeletal: Normal range of motion. She exhibits tenderness.  Lymphadenopathy:    She has no cervical adenopathy.  Neurological: She is alert and oriented to person, place, and time.  Skin: Skin is warm and dry.  Psychiatric: She has a normal mood and affect. Her behavior is normal.  Nursing note and vitals reviewed.   ED Course  Procedures (including critical care time)  Labs Review Labs Reviewed - No data to display  Imaging Review No results found.   MDM   1. Acute maxillary sinusitis, recurrence not specified    Hx and exam c/w sinusitis Due to facial pain, will tx as bacterial cause Rx: augmentin Encouraged use of nasal saline and  humidifier.  Advised pt to use acetaminophen and ibuprofen as needed for fever and pain. Encouraged rest and fluids. F/u with PCP in 7-10 days if not improving, sooner if worsening. Pt verbalized understanding and agreement with tx plan.     Junius Finner, PA-C 09/13/15 [phone]

## 2015-09-13 NOTE — Discharge Instructions (Signed)
You may take 400-600mg Ibuprofen (Motrin) every 6-8 hours for fever and pain  °Alternate with Tylenol  °You may take 500mg Tylenol every 4-6 hours as needed for fever and pain  °Follow-up with your primary care provider next week for recheck of symptoms if not improving.  °Be sure to drink plenty of fluids and rest, at least 8hrs of sleep a night, preferably more while you are sick. °Return urgent care or go to closest ER if you cannot keep down fluids/signs of dehydration, fever not reducing with Tylenol, difficulty breathing/wheezing, stiff neck, worsening condition, or other concerns (see below)  °Please take antibiotics as prescribed and be sure to complete entire course even if you start to feel better to ensure infection does not come back. ° ° °Sinus Rinse °WHAT IS A SINUS RINSE? °A sinus rinse is a simple home treatment that is used to rinse your sinuses with a sterile mixture of salt and water (saline solution). Sinuses are air-filled spaces in your skull behind the bones of your face and forehead that open into your nasal cavity. °You will use the following: °· Saline solution. °· Neti pot or spray bottle. This releases the saline solution into your nose and through your sinuses. Neti pots and spray bottles can be purchased at your local pharmacy, a health food store, or online. °WHEN WOULD I DO A SINUS RINSE? °A sinus rinse can help to clear mucus, dirt, dust, or pollen from the nasal cavity. You may do a sinus rinse when you have a cold, a virus, nasal allergy symptoms, a sinus infection, or stuffiness in the nose or sinuses. °If you are considering a sinus rinse: °· Ask your child's health care provider before performing a sinus rinse on your child. °· Do not do a sinus rinse if you have had ear or nasal surgery, ear infection, or blocked ears. °HOW DO I DO A SINUS RINSE? °· Wash your hands. °· Disinfect your device according to the directions provided and then dry it. °· Use the solution that comes  with your device or one that is sold separately in stores. Follow the mixing directions on the package. °· Fill your device with the amount of saline solution as directed by the device instructions. °· Stand over a sink and tilt your head sideways over the sink. °· Place the spout of the device in your upper nostril (the one closer to the ceiling). °· Gently pour or squeeze the saline solution into the nasal cavity. The liquid should drain to the lower nostril if you are not overly congested. °· Gently blow your nose. Blowing too hard may cause ear pain. °· Repeat in the other nostril. °· Clean and rinse your device with clean water and then air-dry it. °ARE THERE RISKS OF A SINUS RINSE?  °Sinus rinse is generally very safe and effective. However, there are a few risks, which include:  °· A burning sensation in the sinuses. This may happen if you do not make the saline solution as directed. Make sure to follow all directions when making the saline solution. °· Infection from contaminated water. This is rare, but possible. °· Nasal irritation. °  °This information is not intended to replace advice given to you by your health care provider. Make sure you discuss any questions you have with your health care provider. °  °Document Released: 04/09/2014 Document Reviewed: 04/09/2014 °Elsevier Interactive Patient Education ©2016 [employer] ° °Sinusitis, Adult °Sinusitis is redness, soreness, and inflammation of the paranasal sinuses.   Paranasal sinuses are air pockets within the bones of your face. They are located beneath your eyes, in the middle of your forehead, and above your eyes. In healthy paranasal sinuses, mucus is able to drain out, and air is able to circulate through them by way of your nose. However, when your paranasal sinuses are inflamed, mucus and air can become trapped. This can allow bacteria and other germs to grow and cause infection. °Sinusitis can develop quickly and last only a short time (acute)  or continue over a long period (chronic). Sinusitis that lasts for more than 12 weeks is considered chronic. °CAUSES °Causes of sinusitis include: °· Allergies. °· Structural abnormalities, such as displacement of the cartilage that separates your nostrils (deviated septum), which can decrease the air flow through your nose and sinuses and affect sinus drainage. °· Functional abnormalities, such as when the small hairs (cilia) that line your sinuses and help remove mucus do not work properly or are not present. °SIGNS AND SYMPTOMS °Symptoms of acute and chronic sinusitis are the same. The primary symptoms are pain and pressure around the affected sinuses. Other symptoms include: °· Upper toothache. °· Earache. °· Headache. °· Bad breath. °· Decreased sense of smell and taste. °· A cough, which worsens when you are lying flat. °· Fatigue. °· Fever. °· Thick drainage from your nose, which often is green and may contain pus (purulent). °· Swelling and warmth over the affected sinuses. °DIAGNOSIS °Your health care provider will perform a physical exam. During your exam, your health care provider may perform any of the following to help determine if you have acute sinusitis or chronic sinusitis: °· Look in your nose for signs of abnormal growths in your nostrils (nasal polyps). °· Tap over the affected sinus to check for signs of infection. °· View the inside of your sinuses using an imaging device that has a light attached (endoscope). °If your health care provider suspects that you have chronic sinusitis, one or more of the following tests may be recommended: °· Allergy tests. °· Nasal culture. A sample of mucus is taken from your nose, sent to a lab, and screened for bacteria. °· Nasal cytology. A sample of mucus is taken from your nose and examined by your health care provider to determine if your sinusitis is related to an allergy. °TREATMENT °Most cases of acute sinusitis are related to a viral infection and will  resolve on their own within 10 days. Sometimes, medicines are prescribed to help relieve symptoms of both acute and chronic sinusitis. These may include pain medicines, decongestants, nasal steroid sprays, or saline sprays. °However, for sinusitis related to a bacterial infection, your health care provider will prescribe antibiotic medicines. These are medicines that will help kill the bacteria causing the infection. °Rarely, sinusitis is caused by a fungal infection. In these cases, your health care provider will prescribe antifungal medicine. °For some cases of chronic sinusitis, surgery is needed. Generally, these are cases in which sinusitis recurs more than 3 times per year, despite other treatments. °HOME CARE INSTRUCTIONS °· Drink plenty of water. Water helps thin the mucus so your sinuses can drain more easily. °· Use a humidifier. °· Inhale steam 3-4 times a day (for example, sit in the bathroom with the shower running). °· Apply a warm, moist washcloth to your face 3-4 times a day, or as directed by your health care provider. °· Use saline nasal sprays to help moisten and clean your sinuses. °· Take medicines only as directed   by your health care provider. °· If you were prescribed either an antibiotic or antifungal medicine, finish it all even if you start to feel better. °SEEK IMMEDIATE MEDICAL CARE IF: °· You have increasing pain or severe headaches. °· You have nausea, vomiting, or drowsiness. °· You have swelling around your face. °· You have vision problems. °· You have a stiff neck. °· You have difficulty breathing. °  °This information is not intended to replace advice given to you by your health care provider. Make sure you discuss any questions you have with your health care provider. °  °Document Released: 09/12/2005 Document Revised: 10/03/2014 Document Reviewed: 09/27/2011 °Elsevier Interactive Patient Education ©2016 [employer] Inc. ° ° °

## 2015-09-17 ENCOUNTER — Encounter: Payer: Self-pay | Admitting: Sports Medicine

## 2015-09-24 ENCOUNTER — Ambulatory Visit (INDEPENDENT_AMBULATORY_CARE_PROVIDER_SITE_OTHER): Payer: BLUE CROSS/BLUE SHIELD | Admitting: Sports Medicine

## 2015-09-24 ENCOUNTER — Encounter: Payer: Self-pay | Admitting: Sports Medicine

## 2015-09-24 DIAGNOSIS — E669 Obesity, unspecified: Secondary | ICD-10-CM | POA: Diagnosis not present

## 2015-09-24 MED ORDER — TOPIRAMATE 50 MG PO TABS: ORAL_TABLET | ORAL | Status: DC

## 2015-09-24 MED ORDER — PHENTERMINE HCL 37.5 MG PO TABS: ORAL_TABLET | ORAL | Status: DC

## 2015-09-24 NOTE — Assessment & Plan Note (Signed)
Starting phentermine, topamax, nutrition referral.

## 2015-09-24 NOTE — Progress Notes (Signed)
  Subjective:    CC: follow-up  HPI: Obesity: Desires to start weight loss, has lost 20-30 pounds on her own, but is plateauing.  Past medical history, Surgical history, Family history not pertinant except as noted below, Social history, Allergies, and medications have been entered into the medical record, reviewed, and no changes needed.   Review of Systems: No fevers, chills, night sweats, weight loss, chest pain, or shortness of breath.   Objective:    General: Well Developed, well nourished, and in no acute distress.  Neuro: Alert and oriented x3, extra-ocular muscles intact, sensation grossly intact.  HEENT: Normocephalic, atraumatic, pupils equal round reactive to light, neck supple, no masses, no lymphadenopathy, thyroid nonpalpable.  Skin: Warm and dry, no rashes. Cardiac: Regular rate and rhythm, no murmurs rubs or gallops, no lower extremity edema.  Respiratory: Clear to auscultation bilaterally. Not using accessory muscles, speaking in full sentences.  Impression and Recommendations:    I spent 25 minutes with this patient, greater than 50% was face-to-face time counseling regarding the above diagnoses

## 2015-09-27 HISTORY — PX: THUMB FUSION: SUR636

## 2015-09-30 ENCOUNTER — Ambulatory Visit: Payer: BLUE CROSS/BLUE SHIELD | Admitting: Sports Medicine

## 2015-10-23 ENCOUNTER — Ambulatory Visit (INDEPENDENT_AMBULATORY_CARE_PROVIDER_SITE_OTHER): Payer: BLUE CROSS/BLUE SHIELD | Admitting: Sports Medicine

## 2015-10-23 VITALS — BP 113/69 | HR 82 | Resp 16 | Wt 227.6 lb

## 2015-10-23 DIAGNOSIS — E669 Obesity, unspecified: Secondary | ICD-10-CM

## 2015-10-23 DIAGNOSIS — S46311A Strain of muscle, fascia and tendon of triceps, right arm, initial encounter: Secondary | ICD-10-CM | POA: Diagnosis not present

## 2015-10-23 MED ORDER — PHENTERMINE HCL 37.5 MG PO TABS: ORAL_TABLET | ORAL | Status: DC

## 2015-10-23 MED ORDER — TOPIRAMATE 50 MG PO TABS: ORAL_TABLET | ORAL | Status: DC

## 2015-10-23 NOTE — Progress Notes (Signed)
  Subjective:    CC:  Weight check  HPI: Wanda Valencia had 5 pounds weight loss in the first week on phentermine but felt some difficulty sleeping so has since stopped it.  Right elbow pain: Present for 7 months, worse just proximal to the olecranon and worse with certain movements, she had a fall back in June of last year, and has had persistent pain since.  Past medical history, Surgical history, Family history not pertinant except as noted below, Social history, Allergies, and medications have been entered into the medical record, reviewed, and no changes needed.   Review of Systems: No fevers, chills, night sweats, weight loss, chest pain, or shortness of breath.   Objective:    General: Well Developed, well nourished, and in no acute distress.  Neuro: Alert and oriented x3, extra-ocular muscles intact, sensation grossly intact.  HEENT: Normocephalic, atraumatic, pupils equal round reactive to light, neck supple, no masses, no lymphadenopathy, thyroid nonpalpable.  Skin: Warm and dry, no rashes. Cardiac: Regular rate and rhythm, no murmurs rubs or gallops, no lower extremity edema.  Respiratory: Clear to auscultation bilaterally. Not using accessory muscles, speaking in full sentences. Right Elbow: Unremarkable to inspection. Range of motion full pronation, supination, flexion, extension. Strength is full to all of the above directions, there is reproduction of pain with resisted extension Stable to varus, valgus stress. Negative moving valgus stress test. No discrete areas of tenderness to palpation. Ulnar nerve does not sublux. Negative cubital tunnel Tinel's.  Impression and Recommendations:

## 2015-10-23 NOTE — Assessment & Plan Note (Signed)
Home rehabilitation exercises, return in one month.

## 2015-10-23 NOTE — Assessment & Plan Note (Signed)
Had some racing thoughts on a full dose of phentermine, decreasing to one half tab daily. Continue Topamax. Return in one month for a weight check. At some point we will increase to a full tab.

## 2015-11-20 ENCOUNTER — Ambulatory Visit (INDEPENDENT_AMBULATORY_CARE_PROVIDER_SITE_OTHER): Payer: BLUE CROSS/BLUE SHIELD

## 2015-11-20 ENCOUNTER — Encounter: Payer: Self-pay | Admitting: Sports Medicine

## 2015-11-20 ENCOUNTER — Ambulatory Visit (INDEPENDENT_AMBULATORY_CARE_PROVIDER_SITE_OTHER): Payer: BLUE CROSS/BLUE SHIELD | Admitting: Sports Medicine

## 2015-11-20 VITALS — BP 111/75 | HR 100 | Resp 18 | Wt 218.0 lb

## 2015-11-20 DIAGNOSIS — E669 Obesity, unspecified: Secondary | ICD-10-CM | POA: Diagnosis not present

## 2015-11-20 DIAGNOSIS — S46311D Strain of muscle, fascia and tendon of triceps, right arm, subsequent encounter: Secondary | ICD-10-CM | POA: Diagnosis not present

## 2015-11-20 DIAGNOSIS — X58XXXA Exposure to other specified factors, initial encounter: Secondary | ICD-10-CM | POA: Diagnosis not present

## 2015-11-20 MED ORDER — PHENTERMINE HCL 37.5 MG PO TABS: ORAL_TABLET | ORAL | Status: DC

## 2015-11-20 MED ORDER — TOPIRAMATE 50 MG PO TABS: ORAL_TABLET | ORAL | Status: DC

## 2015-11-20 NOTE — Assessment & Plan Note (Signed)
Failed response to conservative measures, x-ray and MRI, return to go over results.

## 2015-11-20 NOTE — Assessment & Plan Note (Signed)
9 pound weight loss since the last month, this is the third month of phentermine, refilling phentermine and Topamax

## 2015-11-20 NOTE — Progress Notes (Signed)
  Subjective:    CC: Follow-up  HPI: Obesity: Minimal weight loss after the first 2 months but 9 pounds after the last month, happy with how things are going, currently doing 1 full tablet phentermine and Topamax without side effects.  Clinically diagnosed as tricipital tendinitis and a previous visit, has now failed 4-6 weeks of physician directed rehabilitation.  Past medical history, Surgical history, Family history not pertinant except as noted below, Social history, Allergies, and medications have been entered into the medical record, reviewed, and no changes needed.   Review of Systems: No fevers, chills, night sweats, weight loss, chest pain, or shortness of breath.   Objective:    General: Well Developed, well nourished, and in no acute distress.  Neuro: Alert and oriented x3, extra-ocular muscles intact, sensation grossly intact.  HEENT: Normocephalic, atraumatic, pupils equal round reactive to light, neck supple, no masses, no lymphadenopathy, thyroid nonpalpable.  Skin: Warm and dry, no rashes. Cardiac: Regular rate and rhythm, no murmurs rubs or gallops, no lower extremity edema.  Respiratory: Clear to auscultation bilaterally. Not using accessory muscles, speaking in full sentences.  Impression and Recommendations:    I spent 25 minutes with this patient, greater than 50% was face-to-face time counseling regarding the above diagnoses

## 2015-11-23 ENCOUNTER — Ambulatory Visit (INDEPENDENT_AMBULATORY_CARE_PROVIDER_SITE_OTHER): Payer: BLUE CROSS/BLUE SHIELD

## 2015-11-23 DIAGNOSIS — M25521 Pain in right elbow: Secondary | ICD-10-CM | POA: Diagnosis not present

## 2015-11-23 MED ORDER — PREDNISONE 50 MG PO TABS: ORAL_TABLET | ORAL | Status: DC

## 2015-11-23 NOTE — Addendum Note (Signed)
Addended by: Monica Becton on: 11/23/2015 01:22 PM   Modules accepted: Orders

## 2015-11-26 ENCOUNTER — Other Ambulatory Visit: Payer: Self-pay | Admitting: Sports Medicine

## 2015-12-03 ENCOUNTER — Encounter: Payer: Self-pay | Admitting: Sports Medicine

## 2015-12-05 ENCOUNTER — Encounter: Payer: Self-pay | Admitting: Emergency Medicine

## 2015-12-05 ENCOUNTER — Emergency Department (INDEPENDENT_AMBULATORY_CARE_PROVIDER_SITE_OTHER)
Admission: EM | Admit: 2015-12-05 | Discharge: 2015-12-05 | Disposition: A | Discharge status: Home / Self Care | Payer: BLUE CROSS/BLUE SHIELD | Source: Home / Self Care | Attending: Family Medicine | Admitting: Family Medicine

## 2015-12-05 DIAGNOSIS — R42 Dizziness and giddiness: Secondary | ICD-10-CM

## 2015-12-05 DIAGNOSIS — R1013 Epigastric pain: Secondary | ICD-10-CM

## 2015-12-05 DIAGNOSIS — K219 Gastro-esophageal reflux disease without esophagitis: Secondary | ICD-10-CM

## 2015-12-05 LAB — COMPLETE METABOLIC PANEL WITH GFR
ALT: 25 U/L (ref 6–29)
AST: 20 U/L (ref 10–35)
Albumin: 4.1 g/dL (ref 3.6–5.1)
Alkaline Phosphatase: 75 U/L (ref 33–130)
BUN: 17 mg/dL (ref 7–25)
CO2: 26 mmol/L (ref 20–31)
Calcium: 9.7 mg/dL (ref 8.6–10.4)
Chloride: 102 mmol/L (ref 98–110)
Creat: 1.11 mg/dL — ABNORMAL HIGH (ref 0.50–1.05)
GFR, Est African American: 66 mL/min (ref >=60)
GFR, Est Non African American: 57 mL/min — ABNORMAL LOW (ref >=60)
Glucose, Bld: 104 mg/dL — ABNORMAL HIGH (ref 65–99)
Potassium: 4.1 mmol/L (ref 3.5–5.3)
Sodium: 137 mmol/L (ref 135–146)
Total Bilirubin: 0.4 mg/dL (ref 0.2–1.2)
Total Protein: 6.6 g/dL (ref 6.1–8.1)

## 2015-12-05 LAB — LIPASE: Lipase: 35 U/L (ref 7–60)

## 2015-12-05 MED ORDER — PANTOPRAZOLE SODIUM 20 MG PO TBEC: 20.0000 mg | DELAYED_RELEASE_TABLET | Freq: Every day | ORAL | Status: DC

## 2015-12-05 NOTE — ED Notes (Signed)
EKG obtained. NSR

## 2015-12-05 NOTE — ED Provider Notes (Signed)
CSN: [id number]     Arrival date & time 12/05/15  1211 History   First MD Initiated Contact with Patient 12/05/15 1248     Chief Complaint  Patient presents with  . Dizziness  . Nausea  . Headache  . Abdominal Pain  . Heartburn   (Consider location/radiation/quality/duration/timing/severity/associated sxs/prior Treatment) HPI The pt is a 50yo female presenting to [facility] with c/o intermittent dizziness, nausea, and generalized headache with worsening more frequent heartburn and epigastric pain for about 1 week.  She called her PCP on Tuesday about the heartburn and epigastric pain as that was most concerning at that time.  He advised increasing her acid reflux medication, Zantac.  The symptoms have not improved with the increased Zantac but she notes when the acid reflux was real bad earlier this week she took OTC Pepcid and felt much better within a few minutes. She reports taking Phentermine since January of this year and also reports changing from diet green teat to regular green tea.  No other changes.  Denies personal or family hx of CAD.  Denies chest pain or SOB. Denies any pain or dizziness at this time.   Past Medical History  Diagnosis Date  . Incontinence   . 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 Hypertension   . Overactive bladder   . Kidney stones   . Hx: UTI (urinary tract infection)   . Family history of adverse reaction to anesthesia     "my sister has reactions to about anything; anesthesia included"  . Chronic bronchitis (HCC)     "born w/it; get it alot"  . GERD (gastroesophageal reflux disease)   . Arthritis     "right knee and thumb" (05/08/2015)   Past Surgical History  Procedure Laterality Date  . Cesarean section  1989; 1991  . Cystoscopy w/ stone manipulation  09/2006  . Joint replacement    . 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 Tubal ligation  10/14/2011  . 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 Hypertension Mother   . Depression Mother   . Hypertension Father   . Cancer Father     tongue/kidney   Social History  Substance Use Topics  . Smoking status: Former Smoker -- 0.10 packs/day for 2 years    Types: Cigarettes    Quit date: 09/27/1983  . Smokeless tobacco: Never Used  . Alcohol Use: Yes     Comment: 05/08/2015 "a few drinks/year"   OB History    No data available     Review of Systems  Constitutional: Negative for fever and chills.  HENT: Negative for congestion, ear pain, sore throat, trouble swallowing and voice change.   Respiratory: Negative for cough and shortness of breath.   Cardiovascular: Negative for chest pain and palpitations.  Gastrointestinal: Positive for nausea and abdominal pain. Negative for vomiting and diarrhea.  Musculoskeletal: Negative for myalgias, back pain and arthralgias.  Skin: Negative for rash.  Neurological: Positive for dizziness and headaches. Negative for light-headedness.    Allergies  Review of patient's allergies indicates no known allergies.  Home Medications   Prior to Admission medications   Medication Sig Start Date End Date Taking? Authorizing Provider  aspirin 81 MG tablet Take 81 mg by mouth daily.    Historical Provider,  MD  CRANBERRY PO Take 1 capsule by mouth.     Historical Provider, MD  FIBER PO Take 2 capsules by mouth daily. Chewable    Historical Provider, MD  lisinopril-hydrochlorothiazide (PRINZIDE,ZESTORETIC) 20-25 MG tablet TAKE 1 TABLET BY MOUTH DAILY. 11/26/15   Monica Bectonhomas J Thekkekandam, MD  pantoprazole (PROTONIX) 20 MG tablet Take 1 tablet (20 mg total) by mouth daily. 12/05/15   Junius FinnerErin O'Malley, PA-C  phentermine (ADIPEX-P) 37.5 MG tablet 1 tab PO every morning 11/20/15   Monica Bectonhomas J Thekkekandam, MD  predniSONE (DELTASONE) 50 MG tablet One tab PO daily for 5 days.  11/23/15   Monica Bectonhomas J Thekkekandam, MD  ranitidine (ZANTAC) 150 MG tablet Take 150 mg by mouth daily.     Historical Provider, MD  topiramate (TOPAMAX) 50 MG tablet One tab by mouth qHS. 11/20/15   Monica Bectonhomas J Thekkekandam, MD   Meds Ordered and Administered this Visit  Medications - No data to display  BP 107/73 mmHg  Pulse 90  Temp(Src) 98.2 F (36.8 C) (Oral)  Resp 16  Ht 5\' 3"  (1.6 m)  Wt 214 lb 8 oz (97.297 kg)  BMI 38.01 kg/m2  SpO2 99%  LMP 09/29/2011 No data found.   Physical Exam  Constitutional: She is oriented to person, place, and time. She appears well-developed and well-nourished. No distress.  Pt lying on exam bed, NAD.  HENT:  Head: Normocephalic and atraumatic.  Eyes: Conjunctivae and EOM are normal. Pupils are equal, round, and reactive to light. No scleral icterus.  Neck: Normal range of motion. Neck supple.  Cardiovascular: Normal rate, regular rhythm and normal heart sounds.   Pulmonary/Chest: Effort normal and breath sounds normal. No respiratory distress. She has no wheezes. She has no rales. She exhibits no tenderness.  Abdominal: Soft. Bowel sounds are normal. She exhibits no distension and no mass. There is no tenderness. There is no rebound and no guarding.  Musculoskeletal: Normal range of motion.  Neurological: She is alert and oriented to person, place, and time. She has normal strength. No cranial nerve deficit or sensory deficit. Coordination and gait normal. GCS eye subscore is 4. GCS verbal subscore is 5. GCS motor subscore is 6.  Skin: Skin is warm and dry. She is not diaphoretic.  Nursing note and vitals reviewed.   ED Course  Procedures (including critical care time)  Labs Review Labs Reviewed  COMPLETE METABOLIC PANEL WITH GFR  LIPASE  POCT CBC W AUTO DIFF ([facility])    Imaging Review No results found.    MDM   1. Dizziness   2. Gastroesophageal reflux disease, esophagitis presence not specified   3. Epigastric pain     Pt c/o intermittent epigastric pain, nausea, and dizziness.  Normal neuro exam.  Pt is 50yo and female, EKG performed to help r/o ACS.    EKG: sinus rhythm, normal.  CBC- unremarkable CMP and Lipase pending.   Pt denies chest pain or SOB. Doubt ACS, reassured with normal EKG.   Dizziness may be due to recently prescribed Phentermine as pt states she has "not felt herself" since taking it.  Rx: Protonix  Encouraged to call PCP Monday to schedule f/u appointment.  Discussed symptoms that warrant emergent care in the ED. Patient verbalized understanding and agreement with treatment plan.     Junius Finnerrin O'Malley, PA-C 12/05/15 1616

## 2015-12-05 NOTE — ED Notes (Signed)
Patient advises that she has had intermittent dizziness, nausea, headache, heart burn, and abdominal pain and has had symptoms intermittently since Tuesday sees Dr. Karie Schwalbe for weight lost since January taking Phenterimine

## 2015-12-18 ENCOUNTER — Ambulatory Visit: Payer: BLUE CROSS/BLUE SHIELD | Admitting: Sports Medicine

## 2016-03-14 ENCOUNTER — Encounter: Payer: Self-pay | Admitting: Sports Medicine

## 2016-03-14 ENCOUNTER — Ambulatory Visit (INDEPENDENT_AMBULATORY_CARE_PROVIDER_SITE_OTHER): Payer: BLUE CROSS/BLUE SHIELD | Admitting: Sports Medicine

## 2016-03-14 VITALS — BP 123/78 | HR 89 | Wt 220.0 lb

## 2016-03-14 DIAGNOSIS — M25541 Pain in joints of right hand: Secondary | ICD-10-CM

## 2016-03-14 DIAGNOSIS — M79641 Pain in right hand: Secondary | ICD-10-CM

## 2016-03-14 NOTE — Assessment & Plan Note (Signed)
Nine-month response to previous injection, repeated today, return as needed

## 2016-03-14 NOTE — Progress Notes (Signed)
  Subjective:    CC: Right thumb pain  HPI: This is a pleasant [age] year old female with known right first metacarpophalangeal osteoarthritis, symptoms are moderate, persistent. Last injection was almost 1 year ago and she desires repeat. No radiation, no trauma.  Past medical history, Surgical history, Family history not pertinant except as noted below, Social history, Allergies, and medications have been entered into the medical record, reviewed, and no changes needed.   Review of Systems: No fevers, chills, night sweats, weight loss, chest pain, or shortness of breath.   Objective:    General: Well Developed, well nourished, and in no acute distress.  Neuro: Alert and oriented x3, extra-ocular muscles intact, sensation grossly intact.  HEENT: Normocephalic, atraumatic, pupils equal round reactive to light, neck supple, no masses, no lymphadenopathy, thyroid nonpalpable.  Skin: Warm and dry, no rashes. Cardiac: Regular rate and rhythm, no murmurs rubs or gallops, no lower extremity edema.  Respiratory: Clear to auscultation bilaterally. Not using accessory muscles, speaking in full sentences. Right hand: Tender to palpation with swelling over the first metacarpophalangeal joint.  Procedure: Real-time Ultrasound Guided Injection of right first MCP Device: GE Logiq E  Verbal informed consent obtained.  Time-out conducted.  Noted no overlying erythema, induration, or other signs of local infection.  Skin prepped in a sterile fashion.  Local anesthesia: Topical Ethyl chloride.  With sterile technique and under real time ultrasound guidance:  Using a 30-gauge needle advanced into the first MCP, and injected 1/2 mL lidocaine, 1/2 mL kenalog 40. Completed without difficulty  Pain immediately resolved suggesting accurate placement of the medication.  Advised to call if fevers/chills, erythema, induration, drainage, or persistent bleeding.  Images permanently stored and available for review in  the ultrasound unit.  Impression: Technically successful ultrasound guided injection.  Impression and Recommendations:

## 2016-03-28 ENCOUNTER — Encounter: Payer: Self-pay | Admitting: *Deleted

## 2016-03-28 ENCOUNTER — Emergency Department
Admission: EM | Admit: 2016-03-28 | Discharge: 2016-03-28 | Disposition: A | Discharge status: Home / Self Care | Payer: BLUE CROSS/BLUE SHIELD | Source: Home / Self Care | Attending: Family Medicine | Admitting: Family Medicine

## 2016-03-28 DIAGNOSIS — R300 Dysuria: Secondary | ICD-10-CM | POA: Diagnosis not present

## 2016-03-28 DIAGNOSIS — J069 Acute upper respiratory infection, unspecified: Secondary | ICD-10-CM | POA: Diagnosis not present

## 2016-03-28 DIAGNOSIS — B9789 Other viral agents as the cause of diseases classified elsewhere: Secondary | ICD-10-CM

## 2016-03-28 DIAGNOSIS — M545 Low back pain: Secondary | ICD-10-CM | POA: Diagnosis not present

## 2016-03-28 LAB — POCT URINALYSIS DIP (MANUAL ENTRY)
BILIRUBIN UA: NEGATIVE
BILIRUBIN UA: NEGATIVE
Blood, UA: NEGATIVE
GLUCOSE UA: NEGATIVE
Nitrite, UA: NEGATIVE
PH UA: 6.5 (ref 5–8)
Protein Ur, POC: NEGATIVE
Spec Grav, UA: 1.02 (ref 1.005–1.03)
Urobilinogen, UA: 0.2 (ref 0–1)

## 2016-03-28 MED ORDER — CEFDINIR 250 MG/5ML PO SUSR: ORAL | Status: DC

## 2016-03-28 NOTE — ED Provider Notes (Signed)
CSN: [id number]     Arrival date & time 03/28/16  1439 History   First MD Initiated Contact with Patient 03/28/16 1515     Chief Complaint  Patient presents with  . Back Pain  . Cough      HPI Comments: Five days ago patient developed urinary frequency, nausea (without vomiting), and mild back ache.  She had leftover amoxicillin which she began taking.  Two days ago she developed sore throat, sinus congestion, fatigue, and cough.  She developed chills/fever today.  She has a history of bronchitis.  The history is provided by the patient.    Past Medical History  Diagnosis Date  . Incontinence   . 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 Hypertension   . Overactive bladder   . Kidney stones   . Hx: UTI (urinary tract infection)   . Family history of adverse reaction to anesthesia     "my sister has reactions to about anything; anesthesia included"  . Chronic bronchitis (HCC)     "born w/it; get it alot"  . GERD (gastroesophageal reflux disease)   . Arthritis     "right knee and thumb" (05/08/2015)   Past Surgical History  Procedure Laterality Date  . Cesarean section  1989; 1991  . Cystoscopy w/ stone manipulation  09/2006  . Joint replacement    . 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 Tubal ligation  10/14/2011  . 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 Hypertension Mother   . Depression Mother   . Hypertension Father   . Cancer Father     tongue/kidney   Social History  Substance Use Topics  . Smoking status: Former Smoker -- 0.10 packs/day for 2 years    Types: Cigarettes    Quit date: 09/27/1983  . Smokeless tobacco: Never Used  . Alcohol Use: Yes     Comment: 05/08/2015 "a few drinks/year"   OB History    No data available     Review of Systems + sore throat +  cough No pleuritic pain No wheezing + nasal congestion + post-nasal drainage No sinus pain/pressure No itchy/red eyes No earache No hemoptysis No SOB No fever, + chills + nausea No vomiting No abdominal pain No diarrhea + urinary symptoms No skin rash + fatigue No myalgias No headache Used OTC meds without relief  Allergies  Review of patient's allergies indicates no known allergies.  Home Medications   Prior to Admission medications   Medication Sig Start Date End Date Taking? Authorizing Provider  aspirin 81 MG tablet Take 81 mg by mouth daily.   Yes Historical Provider, MD  CRANBERRY PO Take 1 capsule by mouth.    Yes Historical Provider, MD  FIBER PO Take 2 capsules by mouth daily. Chewable   Yes Historical Provider, MD  lisinopril-hydrochlorothiazide (PRINZIDE,ZESTORETIC) 20-25 MG tablet TAKE 1 TABLET BY MOUTH DAILY. 11/26/15  Yes Monica Bectonhomas J Thekkekandam, MD  cefdinir (OMNICEF) 250 MG/5ML suspension Take 6mL by mouth every 12 hours 03/28/16   Lattie HawStephen A Constance Hackenberg, MD  pantoprazole (PROTONIX) 20 MG tablet Take 1 tablet (20 mg total) by mouth daily. 12/05/15   Junius FinnerErin O'Malley, PA-C  ranitidine (ZANTAC) 150 MG tablet Take 150 mg by mouth daily.     Historical Provider, MD   Meds  Ordered and Administered this Visit  Medications - No data to display  BP 114/75 mmHg  Pulse 80  Temp(Src) 98.3 F (36.8 C) (Oral)  Resp 16  Ht 5\' 3"  (1.6 m)  Wt 220 lb (99.791 kg)  BMI 38.98 kg/m2  SpO2 97%  LMP 09/29/2011 No data found.   Physical Exam Nursing notes and Vital Signs reviewed. Appearance:  Patient appears stated age, and in no acute distress Eyes:  Pupils are equal, round, and reactive to light and accomodation.  Extraocular movement is intact.  Conjunctivae are not inflamed  Ears:  Canals normal.  Tympanic membranes normal.  Nose:  Mildly congested turbinates.  No sinus tenderness.    Pharynx:  Normal Neck:  Supple.  Tender enlarged posterior/lateral nodes are palpated  bilaterally  Lungs:  Clear to auscultation.  Breath sounds are equal.  Moving air well. Heart:  Regular rate and rhythm without murmurs, rubs, or gallops.  Abdomen:  Nontender without masses or hepatosplenomegaly.  Bowel sounds are present.  No CVA or flank tenderness.  Extremities:  No edema.  Skin:  No rash present.   ED Course  Procedures none    Labs Reviewed  POCT URINALYSIS DIP (MANUAL ENTRY) - Abnormal; Notable for the following:    Leukocytes, UA Trace (*)    All other components within normal limits  URINE CULTURE  POCT RAPID STREP A (OFFICE) negative     MDM   1. Low back pain, unspecified back pain laterality, with sciatica presence unspecified   2. Dysuria   3. Viral URI with cough    Urine culture pending. Begin empiric Omnicef. Take plain guaifenesin (1200mg  extended release tabs such as Mucinex) twice daily, with plenty of water, for cough and congestion. Get adequate rest.   May use Afrin nasal spray (or generic oxymetazoline) twice daily for about 5 days and then discontinue.  Also recommend using saline nasal spray several times daily and saline nasal irrigation (AYR is a common brand).  Use Flonase nasal spray each morning after using Afrin nasal spray and saline nasal irrigation. Try warm salt water gargles for sore throat.  Stop all antihistamines for now, and other non-prescription cough/cold preparations. May take Delsym Cough Suppressant at bedtime for nighttime cough.  Follow-up with family doctor if not improving about10 days.     Lattie HawStephen A Teairra Millar, MD 03/28/16 [phone]

## 2016-03-28 NOTE — Discharge Instructions (Signed)
Take plain guaifenesin (1200mg  extended release tabs such as Mucinex) twice daily, with plenty of water, for cough and congestion. Get adequate rest.   May use Afrin nasal spray (or generic oxymetazoline) twice daily for about 5 days and then discontinue.  Also recommend using saline nasal spray several times daily and saline nasal irrigation (AYR is a common brand).  Use Flonase nasal spray each morning after using Afrin nasal spray and saline nasal irrigation. Try warm salt water gargles for sore throat.  Stop all antihistamines for now, and other non-prescription cough/cold preparations. May take Delsym Cough Suppressant at bedtime for nighttime cough.  Follow-up with family doctor if not improving about10 days.    Dysuria Dysuria is pain or discomfort while urinating. The pain or discomfort may be felt in the tube that carries urine out of the bladder (urethra) or in the surrounding tissue of the genitals. The pain may also be felt in the groin area, lower abdomen, and lower back. You may have to urinate frequently or have the sudden feeling that you have to urinate (urgency). Dysuria can affect both men and women, but is more common in women. Dysuria can be caused by many different things, including:  Urinary tract infection in women.  Infection of the kidney or bladder.  Kidney stones or bladder stones.  Certain sexually transmitted infections (STIs), such as chlamydia.  Dehydration.  Inflammation of the vagina.  Use of certain medicines.  Use of certain soaps or scented products that cause irritation. HOME CARE INSTRUCTIONS Watch your dysuria for any changes. The following actions may help to reduce any discomfort you are feeling:  Drink enough fluid to keep your urine clear or pale yellow.  Empty your bladder often. Avoid holding urine for long periods of time.  After a bowel movement or urination, women should cleanse from front to back, using each tissue only  once.  Empty your bladder after sexual intercourse.  Take medicines only as directed by your health care provider.  If you were prescribed an antibiotic medicine, finish it all even if you start to feel better.  Avoid caffeine, tea, and alcohol. They can irritate the bladder and make dysuria worse. In men, alcohol may irritate the prostate.  Keep all follow-up visits as directed by your health care provider. This is important.  If you had any tests done to find the cause of dysuria, it is your responsibility to obtain your test results. Ask the lab or department performing the test when and how you will get your results. Talk with your health care provider if you have any questions about your results. SEEK MEDICAL CARE IF:  You develop pain in your back or sides.  You have a fever.  You have nausea or vomiting.  You have blood in your urine.  You are not urinating as often as you usually do. SEEK IMMEDIATE MEDICAL CARE IF:  You pain is severe and not relieved with medicines.  You are unable to hold down any fluids.  You or someone else notices a change in your mental function.  You have a rapid heartbeat at rest.  You have shaking or chills.  You feel extremely weak.   This information is not intended to replace advice given to you by your health care provider. Make sure you discuss any questions you have with your health care provider.   Document Released: 06/10/2004 Document Revised: 10/03/2014 Document Reviewed: 05/08/2014 Elsevier Interactive Patient Education [employer]2016 [employer] Inc.

## 2016-03-28 NOTE — ED Notes (Signed)
Pt c/o LBP, lower abd pain, and nausea x 5 days. Reports a hx of recurrent UTI's. She is taking an old rx of Amoxicillin with minimal relief. She also c/o cough, sore throat and feelings of hot/cold x 2 days.

## 2016-03-30 LAB — URINE CULTURE
COLONY COUNT: NO GROWTH
Organism ID, Bacteria: NO GROWTH

## 2016-04-01 ENCOUNTER — Telehealth: Payer: Self-pay | Admitting: Emergency Medicine

## 2016-04-15 ENCOUNTER — Encounter: Payer: Self-pay | Admitting: Sports Medicine

## 2016-04-15 ENCOUNTER — Ambulatory Visit (INDEPENDENT_AMBULATORY_CARE_PROVIDER_SITE_OTHER): Payer: BLUE CROSS/BLUE SHIELD | Admitting: Sports Medicine

## 2016-04-15 VITALS — BP 110/73 | HR 93 | Resp 18 | Wt 218.7 lb

## 2016-04-15 DIAGNOSIS — N3090 Cystitis, unspecified without hematuria: Secondary | ICD-10-CM | POA: Diagnosis not present

## 2016-04-15 DIAGNOSIS — N390 Urinary tract infection, site not specified: Secondary | ICD-10-CM

## 2016-04-15 LAB — POCT URINALYSIS DIPSTICK
Bilirubin, UA: NEGATIVE
Glucose, UA: NEGATIVE
Ketones, UA: NEGATIVE
Nitrite, UA: NEGATIVE
Protein, UA: NEGATIVE
Spec Grav, UA: 1.025
Urobilinogen, UA: 0.2
pH, UA: 5.5

## 2016-04-15 MED ORDER — CIPROFLOXACIN 500 MG/5ML (10%) PO SUSR: 500.0000 mg | Freq: Two times a day (BID) | ORAL | Status: DC

## 2016-04-15 NOTE — Assessment & Plan Note (Signed)
Liquid ciprofloxacin per patient request. 14 day course.  Return to see me as needed, we will keep an eye on the urine culture.

## 2016-04-15 NOTE — Progress Notes (Signed)
  Subjective:    CC: Cystitis  HPI: Several days of frequency, urgency with urination, mild dysuria, no flank pain, no constitutional symptoms. Feeling a bit rundown. Symptoms are moderate, persistent.  Past medical history, Surgical history, Family history not pertinant except as noted below, Social history, Allergies, and medications have been entered into the medical record, reviewed, and no changes needed.   Review of Systems: No fevers, chills, night sweats, weight loss, chest pain, or shortness of breath.   Objective:    General: Well Developed, well nourished, and in no acute distress.  Neuro: Alert and oriented x3, extra-ocular muscles intact, sensation grossly intact.  HEENT: Normocephalic, atraumatic, pupils equal round reactive to light, neck supple, no masses, no lymphadenopathy, thyroid nonpalpable.  Skin: Warm and dry, no rashes. Cardiac: Regular rate and rhythm, no murmurs rubs or gallops, no lower extremity edema.  Respiratory: Clear to auscultation bilaterally. Not using accessory muscles, speaking in full sentences. Abdomen: Soft, nontender, nondistended, normal bowel sounds, no palpable masses, no guarding, rigidity, rebound tenderness.  Urinalysis positive for leukocytes.  Impression and Recommendations:

## 2016-04-18 LAB — URINE CULTURE: Colony Count: >=100000

## 2016-04-19 ENCOUNTER — Telehealth: Payer: Self-pay

## 2016-04-19 MED ORDER — CIPROFLOXACIN HCL 750 MG PO TABS: 750.0000 mg | ORAL_TABLET | Freq: Two times a day (BID) | ORAL | 0 refills | Status: AC

## 2016-04-19 NOTE — Telephone Encounter (Signed)
Switching to standard oral ciprofloxacin

## 2016-04-19 NOTE — Telephone Encounter (Signed)
The pharmacy states the cipro will cost patient $ 50 and patient did not get medication. Can you send in a different medication.

## 2016-04-20 NOTE — Telephone Encounter (Signed)
Unable to reach patient, voicemail is full.

## 2016-04-20 NOTE — Telephone Encounter (Signed)
Patient is aware 

## 2016-05-27 ENCOUNTER — Other Ambulatory Visit: Payer: Self-pay | Admitting: Sports Medicine

## 2016-06-27 ENCOUNTER — Encounter: Payer: Self-pay | Admitting: Sports Medicine

## 2016-06-27 ENCOUNTER — Ambulatory Visit (INDEPENDENT_AMBULATORY_CARE_PROVIDER_SITE_OTHER): Payer: BLUE CROSS/BLUE SHIELD | Admitting: Sports Medicine

## 2016-06-27 DIAGNOSIS — M79641 Pain in right hand: Secondary | ICD-10-CM

## 2016-06-27 DIAGNOSIS — M25541 Pain in joints of right hand: Secondary | ICD-10-CM

## 2016-06-27 DIAGNOSIS — Z23 Encounter for immunization: Secondary | ICD-10-CM

## 2016-06-27 NOTE — Progress Notes (Signed)
  Subjective:    CC: Right hand pain  HPI: This is a pleasant 50 year old female with first metacarpal phalangeal joint arthritis, we injected her about 4 months ago and she did relatively well but is having a recurrence of pain. Moderate, persistent, localized without radiation desires repeat interventional treatment today.  Past medical history:  Negative.  See flowsheet/record as well for more information.  Surgical history: Negative.  See flowsheet/record as well for more information.  Family history: Negative.  See flowsheet/record as well for more information.  Social history: Negative.  See flowsheet/record as well for more information.  Allergies, and medications have been entered into the medical record, reviewed, and no changes needed.   Review of Systems: No fevers, chills, night sweats, weight loss, chest pain, or shortness of breath.   Objective:    General: Well Developed, well nourished, and in no acute distress.  Neuro: Alert and oriented x3, extra-ocular muscles intact, sensation grossly intact.  HEENT: Normocephalic, atraumatic, pupils equal round reactive to light, neck supple, no masses, no lymphadenopathy, thyroid nonpalpable.  Skin: Warm and dry, no rashes. Cardiac: Regular rate and rhythm, no murmurs rubs or gallops, no lower extremity edema.  Respiratory: Clear to auscultation bilaterally. Not using accessory muscles, speaking in full sentences.  Procedure: Real-time Ultrasound Guided Injection of right first metacarpophalangeal joint Device: GE Logiq E  Verbal informed consent obtained.  Time-out conducted.  Noted no overlying erythema, induration, or other signs of local infection.  Skin prepped in a sterile fashion.  Local anesthesia: Topical Ethyl chloride.  With sterile technique and under real time ultrasound guidance:  30-gauge needle advanced into the joint and 1/2 mL kenalog 40, 1/2 mL lidocaine injected easily. Completed without difficulty  Pain  immediately resolved suggesting accurate placement of the medication.  Advised to call if fevers/chills, erythema, induration, drainage, or persistent bleeding.  Images permanently stored and available for review in the ultrasound unit.  Impression: Technically successful ultrasound guided injection.  Impression and Recommendations:    Right first Metacarpophalangeal joint pain Previous injection was 4 months ago. Repeat first MCP injection. Return as needed. We will refer her to hand surgery if insufficient improvement.

## 2016-06-27 NOTE — Assessment & Plan Note (Signed)
Previous injection was 4 months ago. Repeat first MCP injection. Return as needed. We will refer her to hand surgery if insufficient improvement.

## 2016-07-04 LAB — HM MAMMOGRAPHY

## 2016-07-06 ENCOUNTER — Encounter: Payer: Self-pay | Admitting: Sports Medicine

## 2016-07-10 ENCOUNTER — Encounter: Payer: Self-pay | Admitting: Sports Medicine

## 2016-07-10 ENCOUNTER — Other Ambulatory Visit: Payer: Self-pay | Admitting: Sports Medicine

## 2016-07-10 DIAGNOSIS — M722 Plantar fascial fibromatosis: Secondary | ICD-10-CM

## 2016-07-11 MED ORDER — DICLOFENAC SODIUM 75 MG PO TBEC: 75.0000 mg | DELAYED_RELEASE_TABLET | Freq: Two times a day (BID) | ORAL | 3 refills | Status: DC

## 2016-07-12 ENCOUNTER — Other Ambulatory Visit: Payer: Self-pay | Admitting: Physician Assistant

## 2016-07-29 ENCOUNTER — Encounter: Payer: Self-pay | Admitting: Sports Medicine

## 2016-07-29 ENCOUNTER — Ambulatory Visit (INDEPENDENT_AMBULATORY_CARE_PROVIDER_SITE_OTHER): Payer: BLUE CROSS/BLUE SHIELD | Admitting: Sports Medicine

## 2016-07-29 ENCOUNTER — Ambulatory Visit: Payer: Self-pay | Admitting: Sports Medicine

## 2016-07-29 DIAGNOSIS — L918 Other hypertrophic disorders of the skin: Secondary | ICD-10-CM | POA: Diagnosis not present

## 2016-07-29 NOTE — Progress Notes (Signed)
  Subjective:    CC: Skin lesion  HPI: For years this pleasant 50 year old female has had suspicious moles on her abdomen. She would like me to look at them.  Past medical history:  Negative.  See flowsheet/record as well for more information.  Surgical history: Negative.  See flowsheet/record as well for more information.  Family history: Negative.  See flowsheet/record as well for more information.  Social history: Negative.  See flowsheet/record as well for more information.  Allergies, and medications have been entered into the medical record, reviewed, and no changes needed.   Review of Systems: No fevers, chills, night sweats, weight loss, chest pain, or shortness of breath.   Objective:    General: Well Developed, well nourished, and in no acute distress.  Neuro: Alert and oriented x3, extra-ocular muscles intact, sensation grossly intact.  HEENT: Normocephalic, atraumatic, pupils equal round reactive to light, neck supple, no masses, no lymphadenopathy, thyroid nonpalpable.  Skin: Warm and dry, There are 2 large skin tags on the anterior right abdominal wall Cardiac: Regular rate and rhythm, no murmurs rubs or gallops, no lower extremity edema.  Respiratory: Clear to auscultation bilaterally. Not using accessory muscles, speaking in full sentences.  Procedure:  Excision of  a 2.5 cm and a 2.0 cm skin tag on the anterior abdominal wall Risks, benefits, and alternatives explained and consent obtained. Time out conducted. Surface prepped with alcohol. 5cc lidocaine with epinephine infiltrated in a field block. Adequate anesthesia ensured. Area prepped and draped in a sterile fashion. Excision performed with: The neck of the skin tags were cramped with a hemostat, excised with a scalpel, and hemostasis was achieved with hyfrecation. Pt stable.  Impression and Recommendations:    Skin tag Surgical excision with hyfrecation of 2 anterior abdominal skin tags.

## 2016-07-29 NOTE — Assessment & Plan Note (Signed)
Surgical excision with hyfrecation of 2 anterior abdominal skin tags.

## 2016-08-08 ENCOUNTER — Other Ambulatory Visit: Payer: Self-pay | Admitting: Sports Medicine

## 2016-08-08 DIAGNOSIS — M722 Plantar fascial fibromatosis: Secondary | ICD-10-CM

## 2016-08-08 MED ORDER — DICLOFENAC SODIUM 75 MG PO TBEC: 75.0000 mg | DELAYED_RELEASE_TABLET | Freq: Two times a day (BID) | ORAL | 1 refills | Status: DC

## 2016-08-12 ENCOUNTER — Ambulatory Visit: Payer: BLUE CROSS/BLUE SHIELD | Admitting: Sports Medicine

## 2016-08-12 ENCOUNTER — Encounter: Payer: Self-pay | Admitting: Sports Medicine

## 2016-10-19 ENCOUNTER — Encounter: Payer: Self-pay | Admitting: Sports Medicine

## 2016-10-21 ENCOUNTER — Encounter: Payer: Self-pay | Admitting: Sports Medicine

## 2016-10-21 ENCOUNTER — Ambulatory Visit (INDEPENDENT_AMBULATORY_CARE_PROVIDER_SITE_OTHER): Payer: BLUE CROSS/BLUE SHIELD | Admitting: Sports Medicine

## 2016-10-21 DIAGNOSIS — M79641 Pain in right hand: Secondary | ICD-10-CM | POA: Diagnosis not present

## 2016-10-21 DIAGNOSIS — M25541 Pain in joints of right hand: Secondary | ICD-10-CM

## 2016-10-21 NOTE — Assessment & Plan Note (Signed)
Previous injection provided nearly 4 months of relief.  Repeat right first MCP injection. At this point think she should also touch base with hand surgery.

## 2016-10-21 NOTE — Progress Notes (Signed)
  Subjective:    CC: Right hand pain  HPI: This is a pleasant 51 year old female with known right first MCP osteoarthritis, she is done extremely well in the past with injections. Most recent injection was on most 4 months ago and she desires repeat. Pain is moderate, persistent, localized without radiation.  Past medical history:  Negative.  See flowsheet/record as well for more information.  Surgical history: Negative.  See flowsheet/record as well for more information.  Family history: Negative.  See flowsheet/record as well for more information.  Social history: Negative.  See flowsheet/record as well for more information.  Allergies, and medications have been entered into the medical record, reviewed, and no changes needed.   Review of Systems: No fevers, chills, night sweats, weight loss, chest pain, or shortness of breath.   Objective:    General: Well Developed, well nourished, and in no acute distress.  Neuro: Alert and oriented x3, extra-ocular muscles intact, sensation grossly intact.  HEENT: Normocephalic, atraumatic, pupils equal round reactive to light, neck supple, no masses, no lymphadenopathy, thyroid nonpalpable.  Skin: Warm and dry, no rashes. Cardiac: Regular rate and rhythm, no murmurs rubs or gallops, no lower extremity edema.  Respiratory: Clear to auscultation bilaterally. Not using accessory muscles, speaking in full sentences.  Procedure: Real-time Ultrasound Guided Injection of right first MCP Device: GE Logiq E  Verbal informed consent obtained.  Time-out conducted.  Noted no overlying erythema, induration, or other signs of local infection.  Skin prepped in a sterile fashion.  Local anesthesia: Topical Ethyl chloride.  With sterile technique and under real time ultrasound guidance:  1/2 mL kenalog 40, 1/2 mL lidocaine injected easily. Completed without difficulty  Pain immediately resolved suggesting accurate placement of the medication.  Advised to call  if fevers/chills, erythema, induration, drainage, or persistent bleeding.  Images permanently stored and available for review in the ultrasound unit.  Impression: Technically successful ultrasound guided injection.  Impression and Recommendations:    Right first Metacarpophalangeal joint pain Previous injection provided nearly 4 months of relief.  Repeat right first MCP injection. At this point think she should also touch base with hand surgery.

## 2016-11-28 ENCOUNTER — Encounter: Payer: Self-pay | Admitting: Sports Medicine

## 2016-11-28 ENCOUNTER — Ambulatory Visit (INDEPENDENT_AMBULATORY_CARE_PROVIDER_SITE_OTHER): Payer: BLUE CROSS/BLUE SHIELD | Admitting: Sports Medicine

## 2016-11-28 DIAGNOSIS — J00 Acute nasopharyngitis [common cold]: Secondary | ICD-10-CM | POA: Diagnosis not present

## 2016-11-28 DIAGNOSIS — J310 Chronic rhinitis: Secondary | ICD-10-CM | POA: Insufficient documentation

## 2016-11-28 MED ORDER — FLUTICASONE PROPIONATE 50 MCG/ACT NA SUSP: NASAL | 3 refills | Status: DC

## 2016-11-28 NOTE — Progress Notes (Signed)
  Subjective:    CC: Runny nose  HPI: For the past several days this pleasant 51 year old female has had a right nose after spending several hours outside at night. She has a sore throat, no cough, no constitutional symptoms, really no sinus pain or pressure. Symptoms are mild, persistent. Candidate to go  Past medical history:  Negative.  See flowsheet/record as well for more information.  Surgical history: Negative.  See flowsheet/record as well for more information.  Family history: Negative.  See flowsheet/record as well for more information.  Social history: Negative.  See flowsheet/record as well for more information.  Allergies, and medications have been entered into the medical record, reviewed, and no changes needed.   Review of Systems: No fevers, chills, night sweats, weight loss, chest pain, or shortness of breath.   Objective:    General: Well Developed, well nourished, and in no acute distress.  Neuro: Alert and oriented x3, extra-ocular muscles intact, sensation grossly intact.  HEENT: Normocephalic, atraumatic, pupils equal round reactive to light, neck supple, no masses, no lymphadenopathy, thyroid nonpalpable. Oropharynx, nasopharynx, ear canals unremarkable with the exception of boggy and erythematous turbinates. No tenderness over the frontal or maxillary sinuses. Skin: Warm and dry, no rashes. Cardiac: Regular rate and rhythm, no murmurs rubs or gallops, no lower extremity edema.  Respiratory: Clear to auscultation bilaterally. Not using accessory muscles, speaking in full sentences.  Impression and Recommendations:    Rhinitis No evidence of sinusitis. She does have rhinitis with postnasal drip syndrome. Adding Flonase.  Return as needed.

## 2016-11-28 NOTE — Assessment & Plan Note (Signed)
No evidence of sinusitis. She does have rhinitis with postnasal drip syndrome. Adding Flonase.  Return as needed.

## 2016-12-27 ENCOUNTER — Other Ambulatory Visit: Payer: Self-pay | Admitting: Sports Medicine

## 2017-05-22 ENCOUNTER — Other Ambulatory Visit: Payer: Self-pay | Admitting: Sports Medicine

## 2017-05-22 DIAGNOSIS — Z1239 Encounter for other screening for malignant neoplasm of breast: Secondary | ICD-10-CM

## 2017-05-31 ENCOUNTER — Other Ambulatory Visit: Payer: Self-pay | Admitting: Sports Medicine

## 2017-05-31 ENCOUNTER — Ambulatory Visit: Payer: BLUE CROSS/BLUE SHIELD

## 2017-05-31 DIAGNOSIS — Z1239 Encounter for other screening for malignant neoplasm of breast: Secondary | ICD-10-CM

## 2017-06-12 ENCOUNTER — Ambulatory Visit (INDEPENDENT_AMBULATORY_CARE_PROVIDER_SITE_OTHER): Payer: BLUE CROSS/BLUE SHIELD | Admitting: Obstetrics & Gynecology

## 2017-06-12 ENCOUNTER — Encounter: Payer: Self-pay | Admitting: Obstetrics & Gynecology

## 2017-06-12 VITALS — BP 109/69 | HR 82 | Ht 63.0 in | Wt 232.0 lb

## 2017-06-12 DIAGNOSIS — Z01419 Encounter for gynecological examination (general) (routine) without abnormal findings: Secondary | ICD-10-CM | POA: Diagnosis not present

## 2017-06-12 DIAGNOSIS — Z124 Encounter for screening for malignant neoplasm of cervix: Secondary | ICD-10-CM

## 2017-06-12 DIAGNOSIS — Z23 Encounter for immunization: Secondary | ICD-10-CM

## 2017-06-12 DIAGNOSIS — Z1151 Encounter for screening for human papillomavirus (HPV): Secondary | ICD-10-CM

## 2017-06-12 NOTE — Progress Notes (Signed)
Last pap in Aug 2017- results were normal

## 2017-06-12 NOTE — Progress Notes (Signed)
Subjective:    Wanda Valencia is a 51 y.o. MW P2 (33 and 22 yo kids, 2 1/2 grands) female who presents for an annual exam. The patient has no complaints today. The patient is sexually active. GYN screening history: last pap: was normal. The patient wears seatbelts: yes. The patient participates in regular exercise: no. Has the patient ever been transfused or tattooed?: no. The patient reports that there is not domestic violence in her life.   Menstrual History: OB History    Gravida Para Term Preterm AB Living   '2 2 2     2   ' SAB TAB Ectopic Multiple Live Births                  Menarche age: 48 Patient's last menstrual period was 09/29/2011.    The following portions of the patient's history were reviewed and updated as appropriate: allergies, current medications, past family history, past medical history, past social history, past surgical history and problem list.  Review of Systems Pertinent items are noted in HPI.   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) Brodnax- +breast cancer in sister at 60, negative BRCA for sister, +cervical cancer in paunt, no colon cancer S/p colonoscopy about 2014 Has had cryo twice in her llife Mammogram normal this month   Objective:    BP 109/69   Pulse 82   Ht '5\' 3"'  (1.6 m)   Wt 232 lb (105.2 kg)   LMP 09/29/2011   BMI 41.10 kg/m    Appearce:    Alert, cooperative, no distress, appears stated age  Head:    Normocephalic, without obvious abnormality, atraumatic  Eyes:    PERRL, conjunctiva/corneas clear, EOM's intact, fundi    benign, both eyes  Ears:    Normal TM's and external ear canals, both ears  Nose:   Nares normal, septum midline, mucosa normal, no drainage    or sinus tenderness  Throat:   Lips, mucosa, and tongue normal; teeth and gums normal  Neck:   Supple, symmetrical, trachea midline, no adenopathy;    thyroid:  no  enlargement/tenderness/nodules; no carotid   bruit or JVD  Back:     Symmetric, no curvature, ROM normal, no CVA tenderness  Lungs:     Clear to auscultation bilaterally, respirations unlabored  Chest Wall:    No tenderness or deformity   Heart:    Regular rate and rhythm, S1 and S2 normal, no murmur, rub   or gallop  Breast Exam:    No tenderness, masses, or nipple abnormality  Abdomen:     Soft, non-tender, bowel sounds active all four quadrants,    no masses, no organomegaly  Genitalia:    Normal female without lesion, discharge or tenderness, NSSA, no palpable masses, no tenderness     Extremities:   Extremities normal, atraumatic, no cyanosis or edema  Pulses:   2+ and symmetric all extremities  Skin:   Skin color, texture, turgor normal, no rashes or lesions  Lymph nodes:   Cervical, supraclavicular, and axillary nodes normal  Neurologic:   CNII-XII intact, normal strength, sensation and reflexes    throughout  .    Assessment:    Healthy female exam.    Plan:     Thin prep Pap smear. with cotesting Encouraged weight loss Flu vaccine today

## 2017-06-14 LAB — CYTOLOGY - PAP
DIAGNOSIS: NEGATIVE
HPV: NOT DETECTED

## 2017-06-21 ENCOUNTER — Other Ambulatory Visit: Payer: Self-pay | Admitting: Sports Medicine

## 2017-07-05 ENCOUNTER — Ambulatory Visit (INDEPENDENT_AMBULATORY_CARE_PROVIDER_SITE_OTHER): Payer: BLUE CROSS/BLUE SHIELD

## 2017-07-05 DIAGNOSIS — R928 Other abnormal and inconclusive findings on diagnostic imaging of breast: Secondary | ICD-10-CM

## 2017-07-10 ENCOUNTER — Other Ambulatory Visit: Payer: Self-pay | Admitting: Sports Medicine

## 2017-07-10 DIAGNOSIS — R928 Other abnormal and inconclusive findings on diagnostic imaging of breast: Secondary | ICD-10-CM

## 2017-07-14 ENCOUNTER — Ambulatory Visit
Admission: RE | Admit: 2017-07-14 | Discharge: 2017-07-14 | Disposition: A | Discharge status: Home / Self Care | Payer: BLUE CROSS/BLUE SHIELD | Source: Ambulatory Visit | Attending: Sports Medicine | Admitting: Sports Medicine

## 2017-07-14 DIAGNOSIS — R928 Other abnormal and inconclusive findings on diagnostic imaging of breast: Secondary | ICD-10-CM

## 2017-09-26 ENCOUNTER — Encounter: Payer: Self-pay | Admitting: Sports Medicine

## 2017-10-02 NOTE — Telephone Encounter (Signed)
Done.  Letter in box.

## 2017-12-30 ENCOUNTER — Other Ambulatory Visit: Payer: Self-pay | Admitting: Sports Medicine

## 2018-01-18 ENCOUNTER — Ambulatory Visit (INDEPENDENT_AMBULATORY_CARE_PROVIDER_SITE_OTHER): Payer: BLUE CROSS/BLUE SHIELD

## 2018-01-18 ENCOUNTER — Ambulatory Visit: Payer: BLUE CROSS/BLUE SHIELD | Admitting: Sports Medicine

## 2018-01-18 ENCOUNTER — Encounter: Payer: Self-pay | Admitting: Sports Medicine

## 2018-01-18 DIAGNOSIS — J310 Chronic rhinitis: Secondary | ICD-10-CM

## 2018-01-18 DIAGNOSIS — M7050 Other bursitis of knee, unspecified knee: Secondary | ICD-10-CM

## 2018-01-18 DIAGNOSIS — M1712 Unilateral primary osteoarthritis, left knee: Secondary | ICD-10-CM | POA: Diagnosis not present

## 2018-01-18 MED ORDER — CETIRIZINE-PSEUDOEPHEDRINE ER 5-120 MG PO TB12: 1.0000 | ORAL_TABLET | Freq: Two times a day (BID) | ORAL | Status: DC

## 2018-01-18 MED ORDER — IBUPROFEN 800 MG PO TABS: 800.0000 mg | ORAL_TABLET | Freq: Three times a day (TID) | ORAL | 2 refills | Status: DC | PRN

## 2018-01-18 MED ORDER — FLUTICASONE PROPIONATE 50 MCG/ACT NA SUSP: NASAL | 3 refills | Status: DC

## 2018-01-18 NOTE — Assessment & Plan Note (Signed)
With postnasal drip. Continue Zyrtec, do 5 days of Zyrtec-D, adding Flonase.

## 2018-01-18 NOTE — Assessment & Plan Note (Signed)
Conservative treatment with ibuprofen 803 times per day, icing, rehab exercises, return in 1 month, injection if no better. Getting updated x-rays.

## 2018-01-18 NOTE — Progress Notes (Signed)
Subjective:    CC: cough, left knee pain  HPI: Wanda Valencia, a 52yo female with pmh significant for arthritis of the right knee and seasonal allergies, presents today with a cc of pain in her lateral knee below the joint line. Pt reports that the stabbing pain in her lateral knee began about 8 months ago and has progressively worsened in frequency and intensity. PT reports that  the stabbing pain is exacerbated with activity (walking) and is relieved with rest, and ibuprofen.  Pt reports that the lateral knee pain can be as high as 9/10.  Pt also presents with a chief complaint of a dry cough and dry throat.  Pt reports that her dry cough began on Friday, and her current regiment of anti-allergy medication has not alleviated the symptoms. Pt endorses symptoms of rhinitis that have occurred concurrently. Pt denies presence of fevers, and chills.    I reviewed the past medical history, family history, social history, surgical history, and allergies today and no changes were needed.  Please see the problem list section below in epic for further details.  Past Medical History: Past Medical History:  Diagnosis Date  . Arthritis    "right knee and thumb" (05/08/2015)  . Chronic bronchitis (HCC)    "born w/it; get it alot"  . Family history of adverse reaction to anesthesia    "my sister has reactions to about anything; anesthesia included"  . GERD (gastroesophageal reflux disease)   . Hx: UTI (urinary tract infection)   . Hypertension   . Incontinence   . Kidney stones   . Overactive bladder   . 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 CESAREAN SECTION  1989; 1991  . CYSTOSCOPY W/ STONE MANIPULATION  09/2006  . JOINT REPLACEMENT    . 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   Procedure: TOTAL KNEE ARTHROPLASTY;  Surgeon: Jodi Geralds, MD;  Location: [facility] OR;   Service: Orthopedics;  Laterality: Right;  . TUBAL LIGATION  10/14/2011   Social History: Social History   Socioeconomic History  . Marital status: Married    Spouse name: Not on file  . Number of children: Not on file  . Years of education: Not on file  . Highest education level: Not on file  Occupational History  . Not on file  Social Needs  . Financial resource strain: Not on file  . Food insecurity:    Worry: Not on file    Inability: Not on file  . Transportation needs:    Medical: Not on file    Non-medical: Not on file  Tobacco Use  . Smoking status: Former Smoker    Packs/day: 0.10    Years: 2.00    Pack years: 0.20    Types: Cigarettes    Last attempt to quit: 09/27/1983    Years since quitting: 34.3  . Smokeless tobacco: Never Used  Substance and Sexual Activity  . Alcohol use: No  . Drug use: No  . Sexual activity: Yes    Birth control/protection: Post-menopausal  Lifestyle  . Physical activity:    Days per week: Not on file    Minutes per session: Not on file  . Stress: Not on file  Relationships  . Social connections:    Talks on phone: Not on file    Gets together: Not on file    Attends religious service:  Not on file    Active member of club or organization: Not on file    Attends meetings of clubs or organizations: Not on file    Relationship status: Not on file  Other Topics Concern  . Not on file  Social History Narrative  . Not on file   Family History: Family History  Problem Relation Age of Onset  . Hypertension Mother   . Depression Mother   . Hypertension Father   . Cancer Father        tongue/kidney  . Diabetes Father    Allergies: No Known Allergies Medications: See med rec.  Review of Systems: No fevers, chills, night sweats, weight loss, chest pain, or shortness of breath.   Objective:    General: Well Developed, well nourished, and in no acute distress.  Neuro: Alert and oriented x3, extra-ocular muscles intact,  sensation grossly intact.  HEENT: Normocephalic, atraumatic, pupils equal round reactive to light, neck supple, no masses, no lymphadenopathy, thyroid nonpalpable.  Skin: Warm and dry, no rashes. Cardiac: Regular rate and rhythm, no murmurs rubs or gallops, no lower extremity edema.  Respiratory: Clear to auscultation bilaterally. Not using accessory muscles, speaking in full sentences.  Knee: Normal to inspection with no erythema or effusion or obvious bony abnormalities. Palpation normal with no warmth, joint line tenderness, patellar tenderness, or condyle tenderness. Pain with palpation at the pes anserine bursa ROM full in flexion and extension and lower leg rotation. Ligaments with solid consistent endpoints including ACL, PCL, LCL, MCL. Negative Mcmurray's, Apley's, and Thessaly tests. Non painful patellar compression. Patellar glide without crepitus. Patellar and quadriceps tendons unremarkable. Hamstring and quadriceps strength is normal.    Impression and Recommendations:    Assessment-Left Pes Ansering Bursitis Pt was educated about diagnosis and given a handout on Pes Anserine  bursitis that outlines conservative treatment and basic stretching and strengthening. Pt was prescribed NSAID for pain (ibuprofen).  Pt was advised to follow up in one month. Pt was educated on next steps that include rapid relief with pes anserine injection, should she show no improvement.    Assessment-Post Nasal Drip secondary to Rhinitis Pt was prescribed a decongestant in order to  alleviate symptoms of post-nasal drip which serves as the impetus of her unproductive cough as well as an anti-histamine to provided additional coverage to address her allergies (rhinitis).     ___________________________________________ Ihor Austinhomas J. Benjamin Stainhekkekandam, M.D., ABFM., CAQSM. Primary Care and Sports Medicine [facility]  Adjunct Instructor of Family Medicine  [employer]

## 2018-02-15 ENCOUNTER — Ambulatory Visit: Payer: BLUE CROSS/BLUE SHIELD | Admitting: Sports Medicine

## 2018-04-06 ENCOUNTER — Encounter: Payer: Self-pay | Admitting: Physician Assistant

## 2018-04-06 ENCOUNTER — Ambulatory Visit: Payer: BLUE CROSS/BLUE SHIELD | Admitting: Physician Assistant

## 2018-04-06 VITALS — BP 109/76 | HR 85 | Temp 97.7°F

## 2018-04-06 DIAGNOSIS — M545 Low back pain, unspecified: Secondary | ICD-10-CM

## 2018-04-06 DIAGNOSIS — R82998 Other abnormal findings in urine: Secondary | ICD-10-CM | POA: Diagnosis not present

## 2018-04-06 DIAGNOSIS — Z87440 Personal history of urinary (tract) infections: Secondary | ICD-10-CM | POA: Diagnosis not present

## 2018-04-06 DIAGNOSIS — Z87448 Personal history of other diseases of urinary system: Secondary | ICD-10-CM

## 2018-04-06 DIAGNOSIS — R300 Dysuria: Secondary | ICD-10-CM

## 2018-04-06 LAB — POCT URINALYSIS DIPSTICK
BILIRUBIN UA: NEGATIVE
Blood, UA: NEGATIVE
Glucose, UA: NEGATIVE
KETONES UA: NEGATIVE
Nitrite, UA: NEGATIVE
Protein, UA: NEGATIVE
Spec Grav, UA: 1.02 (ref 1.010–1.025)
UROBILINOGEN UA: 0.2 U/dL
pH, UA: 5.5 (ref 5.0–8.0)

## 2018-04-06 MED ORDER — CIPROFLOXACIN 500 MG/5ML (10%) PO SUSR: 250.0000 mg | Freq: Two times a day (BID) | ORAL | 0 refills | Status: AC

## 2018-04-06 MED ORDER — CEFTRIAXONE SODIUM 1 G IJ SOLR
1.0000 g | Freq: Once | INTRAMUSCULAR | Status: AC
  Administered 2018-04-06: 1 g via INTRAMUSCULAR

## 2018-04-06 NOTE — Progress Notes (Signed)
HPI:                                                                Wanda Valencia is a 52 y.o. female who presents to [facility]: Primary Care Sports Medicine today for UTI symptoms  Urinary Tract Infection   This is a new problem. The current episode started 1 to 4 weeks ago (x 8 days). The problem has been unchanged. The pain is moderate. There has been no fever. There is a history of pyelonephritis. Associated symptoms include flank pain, frequency, nausea and urgency. Pertinent negatives include no chills, discharge, hematuria or vomiting. Associated symptoms comments: + bilateral low back pain. She has tried NSAIDs for the symptoms. Her past medical history is significant for recurrent UTIs.  Back Pain  This is a new problem. The current episode started in the past 7 days. The problem occurs constantly. The problem is unchanged. The pain is present in the lumbar spine. The quality of the pain is described as aching. The pain does not radiate. The pain is mild. The pain is the same all the time. Pertinent negatives include no bladder incontinence, bowel incontinence, leg pain, paresis, paresthesias, perianal numbness or weakness. Risk factors include menopause and obesity. She has tried NSAIDs for the symptoms.      No flowsheet data found.  No flowsheet data found.    Past Medical History:  Diagnosis Date  . Arthritis    "right knee and thumb" (05/08/2015)  . Chronic bronchitis (HCC)    "born w/it; get it alot"  . Family history of adverse reaction to anesthesia    "my sister has reactions to about anything; anesthesia included"  . GERD (gastroesophageal reflux disease)   . Hx: UTI (urinary tract infection)   . Hypertension   . Incontinence   . Kidney stones   . Overactive bladder   . 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 CESAREAN SECTION  1989; 1991  . CYSTOSCOPY W/ STONE MANIPULATION   09/2006  . JOINT REPLACEMENT    . 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   Procedure: TOTAL KNEE ARTHROPLASTY;  Surgeon: Jodi Geralds, MD;  Location: [facility] OR;  Service: Orthopedics;  Laterality: Right;  . TUBAL LIGATION  10/14/2011   Social History   Tobacco Use  . Smoking status: Former Smoker    Packs/day: 0.10    Years: 2.00    Pack years: 0.20    Types: Cigarettes    Last attempt to quit: 09/27/1983    Years since quitting: 34.5  . Smokeless tobacco: Never Used  Substance Use Topics  . Alcohol use: No   family history includes Cancer in her father; Depression in her mother; Diabetes in her father; Hypertension in her father and mother.    ROS: negative except as noted in the HPI  Medications: Current Outpatient Medications  Medication Sig Dispense Refill  . aspirin 81 MG tablet Take 81 mg by mouth daily.    . cetirizine-pseudoephedrine (ZYRTEC-D) 5-120 MG tablet Take 1 tablet by mouth 2 (two) times daily.    . ciprofloxacin (CIPRO) 500 MG/5ML (10%) suspension Take 2.5 mLs (250 mg total) by  mouth 2 (two) times daily for 7 days. 100 mL 0  . CRANBERRY PO Take 1 capsule by mouth.     . FIBER PO Take 2 capsules by mouth daily. Chewable    . fluticasone (FLONASE) 50 MCG/ACT nasal spray One spray in each nostril twice a day, use left hand for right nostril, and right hand for left nostril. 48 g 3  . ibuprofen (ADVIL,MOTRIN) 800 MG tablet Take 1 tablet (800 mg total) by mouth every 8 (eight) hours as needed. 90 tablet 2  . lisinopril-hydrochlorothiazide (PRINZIDE,ZESTORETIC) 20-25 MG tablet TAKE 1 TABLET BY MOUTH DAILY. 90 tablet 1  . ranitidine (ZANTAC) 150 MG tablet Take 150 mg by mouth daily.      Current Facility-Administered Medications  Medication Dose Route Frequency Provider Last Rate Last Dose  . cefTRIAXone (ROCEPHIN) injection 1 g  1 g Intramuscular Once Carlis Stable, PA-C       No Known  Allergies     Objective:  BP 109/76   Pulse 85   Temp 97.7 F (36.5 C)   LMP 09/29/2011  Gen:  alert, not ill-appearing, no distress, appropriate for age HEENT: head normocephalic without obvious abnormality, conjunctiva and cornea clear, trachea midline Pulm: Normal work of breathing, normal phonation GI: abdomen soft, there is suprapubic tenderness, no CVA tenderness Neuro: alert and oriented x 3, no tremor MSK: extremities atraumatic, normal gait and station Back: atraumatic, there is bilateral lumbosacral tenderness Skin: intact, no rashes on exposed skin, no jaundice, no cyanosis Psych: well-groomed, cooperative, good eye contact, euthymic mood, affect mood-congruent, speech is articulate, and thought processes clear and goal-directed    Results for orders placed or performed in visit on 04/06/18 (from the past 72 hour(s))  POCT Urinalysis Dipstick     Status: Abnormal   Collection Time: 04/06/18 11:30 AM  Result Value Ref Range   Color, UA yellow    Clarity, UA slightly cloudy    Glucose, UA Negative Negative   Bilirubin, UA negative    Ketones, UA negative    Spec Grav, UA 1.020 1.010 - 1.025   Blood, UA negative    pH, UA 5.5 5.0 - 8.0   Protein, UA Negative Negative   Urobilinogen, UA 0.2 0.2 or 1.0 E.U./dL   Nitrite, UA negative    Leukocytes, UA Small (1+) (A) Negative   Appearance     Odor     No results found.    Assessment and Plan: 51 y.o. female with   Urine leukocytes - Plan: ciprofloxacin (CIPRO) 500 MG/5ML (10%) suspension, POCT Urinalysis Dipstick, Urine Culture, cefTRIAXone (ROCEPHIN) injection 1 g  History of acute pyelonephritis - Plan: POCT Urinalysis Dipstick, Urine Culture, cefTRIAXone (ROCEPHIN) injection 1 g  Dysuria - Plan: ciprofloxacin (CIPRO) 500 MG/5ML (10%) suspension, POCT Urinalysis Dipstick, Urine Culture, cefTRIAXone (ROCEPHIN) injection 1 g  Acute bilateral low back pain without sciatica  UA positive for trace  leuks Afebrile, no tachycardia, no CVA tenderness Given her history of recurrent UTI and pyelonephritis, I am going to treat her empirically for complicated UTI with Ceftriaxone 1 g IM and Cipro 250 mg bid x 7 days I suspect this is uncomplicated cystitis or OAB, but given her history, duration of symptoms >1 week, and upcoming weekend I will treat her aggressively Urine culture pending  I think her back pain is axial Conservative management with anti-inflammatory, rehab exercises - continue Ibuprofen 600-800 mg every 6 hours as needed for pain - Ice/Heat - gentle stretching - avoid bending /  heavy lifting - avoid bed rest. Stay physically active, but avoid strenuous activities   Patient education and anticipatory guidance given Patient agrees with treatment plan Follow-up as needed if symptoms worsen or fail to improve  Levonne Hubertharley E. Hadlyn Amero PA-C

## 2018-04-06 NOTE — Patient Instructions (Signed)
For low back pain: - continue Ibuprofen 600-800 mg every 6 hours as needed for pain - Ice/Heat - gentle stretching - avoid bending / heavy lifting - avoid bed rest. Stay physically active, but avoid strenuous activities   Low Back Sprain Rehab Ask your health care provider which exercises are safe for you. Do exercises exactly as told by your health care provider and adjust them as directed. It is normal to feel mild stretching, pulling, tightness, or discomfort as you do these exercises, but you should stop right away if you feel sudden pain or your pain gets worse. Do not begin these exercises until told by your health care provider. Stretching and range of motion exercises These exercises warm up your muscles and joints and improve the movement and flexibility of your back. These exercises also help to relieve pain, numbness, and tingling. Exercise A: Lumbar rotation  1. Lie on your back on a firm surface and bend your knees. 2. Straighten your arms out to your sides so each arm forms an "L" shape with a side of your body (a 90 degree angle). 3. Slowly move both of your knees to one side of your body until you feel a stretch in your lower back. Try not to let your shoulders move off of the floor. 4. Hold for __________ seconds. 5. Tense your abdominal muscles and slowly move your knees back to the starting position. 6. Repeat this exercise on the other side of your body. Repeat __________ times. Complete this exercise __________ times a day. Exercise B: Prone extension on elbows  1. Lie on your abdomen on a firm surface. 2. Prop yourself up on your elbows. 3. Use your arms to help lift your chest up until you feel a gentle stretch in your abdomen and your lower back. ? This will place some of your body weight on your elbows. If this is uncomfortable, try stacking pillows under your chest. ? Your hips should stay down, against the surface that you are lying on. Keep your hip and back  muscles relaxed. 4. Hold for __________ seconds. 5. Slowly relax your upper body and return to the starting position. Repeat __________ times. Complete this exercise __________ times a day. Strengthening exercises These exercises build strength and endurance in your back. Endurance is the ability to use your muscles for a long time, even after they get tired. Exercise C: Pelvic tilt 1. Lie on your back on a firm surface. Bend your knees and keep your feet flat. 2. Tense your abdominal muscles. Tip your pelvis up toward the ceiling and flatten your lower back into the floor. ? To help with this exercise, you may place a small towel under your lower back and try to push your back into the towel. 3. Hold for __________ seconds. 4. Let your muscles relax completely before you repeat this exercise. Repeat __________ times. Complete this exercise __________ times a day. Exercise D: Alternating arm and leg raises  1. Get on your hands and knees on a firm surface. If you are on a hard floor, you may want to use padding to cushion your knees, such as an exercise mat. 2. Line up your arms and legs. Your hands should be below your shoulders, and your knees should be below your hips. 3. Lift your left leg behind you. At the same time, raise your right arm and straighten it in front of you. ? Do not lift your leg higher than your hip. ? Do not lift  your arm higher than your shoulder. ? Keep your abdominal and back muscles tight. ? Keep your hips facing the ground. ? Do not arch your back. ? Keep your balance carefully, and do not hold your breath. 4. Hold for __________ seconds. 5. Slowly return to the starting position and repeat with your right leg and your left arm. Repeat __________ times. Complete this exercise __________ times a day. Exercise E: Abdominal set with straight leg raise  1. Lie on your back on a firm surface. 2. Bend one of your knees and keep your other leg straight. 3. Tense  your abdominal muscles and lift your straight leg up, 4-6 inches (10-15 cm) off the ground. 4. Keep your abdominal muscles tight and hold for __________ seconds. ? Do not hold your breath. ? Do not arch your back. Keep it flat against the ground. 5. Keep your abdominal muscles tense as you slowly lower your leg back to the starting position. 6. Repeat with your other leg. Repeat __________ times. Complete this exercise __________ times a day. Posture and body mechanics  Body mechanics refers to the movements and positions of your body while you do your daily activities. Posture is part of body mechanics. Good posture and healthy body mechanics can help to relieve stress in your body's tissues and joints. Good posture means that your spine is in its natural S-curve position (your spine is neutral), your shoulders are pulled back slightly, and your head is not tipped forward. The following are general guidelines for applying improved posture and body mechanics to your everyday activities. Standing   When standing, keep your spine neutral and your feet about hip-width apart. Keep a slight bend in your knees. Your ears, shoulders, and hips should line up.  When you do a task in which you stand in one place for a long time, place one foot up on a stable object that is 2-4 inches (5-10 cm) high, such as a footstool. This helps keep your spine neutral. Sitting   When sitting, keep your spine neutral and keep your feet flat on the floor. Use a footrest, if necessary, and keep your thighs parallel to the floor. Avoid rounding your shoulders, and avoid tilting your head forward.  When working at a desk or a computer, keep your desk at a height where your hands are slightly lower than your elbows. Slide your chair under your desk so you are close enough to maintain good posture.  When working at a computer, place your monitor at a height where you are looking straight ahead and you do not have to tilt  your head forward or downward to look at the screen. Resting   When lying down and resting, avoid positions that are most painful for you.  If you have pain with activities such as sitting, bending, stooping, or squatting (flexion-based activities), lie in a position in which your body does not bend very much. For example, avoid curling up on your side with your arms and knees near your chest (fetal position).  If you have pain with activities such as standing for a long time or reaching with your arms (extension-based activities), lie with your spine in a neutral position and bend your knees slightly. Try the following positions:  Lying on your side with a pillow between your knees.  Lying on your back with a pillow under your knees. Lifting   When lifting objects, keep your feet at least shoulder-width apart and tighten your abdominal muscles.  Bend your knees and hips and keep your spine neutral. It is important to lift using the strength of your legs, not your back. Do not lock your knees straight out.  Always ask for help to lift heavy or awkward objects. This information is not intended to replace advice given to you by your health care provider. Make sure you discuss any questions you have with your health care provider. Document Released: 09/12/2005 Document Revised: 05/19/2016 Document Reviewed: 06/24/2015 Elsevier Interactive Patient Education  [employer].

## 2018-04-07 LAB — URINE CULTURE
MICRO NUMBER: 90830593
SPECIMEN QUALITY: ADEQUATE

## 2018-04-18 ENCOUNTER — Other Ambulatory Visit: Payer: Self-pay | Admitting: Sports Medicine

## 2018-04-18 DIAGNOSIS — M7050 Other bursitis of knee, unspecified knee: Secondary | ICD-10-CM

## 2018-04-23 ENCOUNTER — Encounter (HOSPITAL_BASED_OUTPATIENT_CLINIC_OR_DEPARTMENT_OTHER): Payer: Self-pay | Admitting: *Deleted

## 2018-04-23 ENCOUNTER — Other Ambulatory Visit: Payer: Self-pay

## 2018-04-23 ENCOUNTER — Emergency Department (HOSPITAL_BASED_OUTPATIENT_CLINIC_OR_DEPARTMENT_OTHER)
Admission: EM | Admit: 2018-04-23 | Discharge: 2018-04-23 | Disposition: A | Discharge status: Home / Self Care | Payer: BLUE CROSS/BLUE SHIELD | Attending: Emergency Medicine | Admitting: Emergency Medicine

## 2018-04-23 ENCOUNTER — Emergency Department (INDEPENDENT_AMBULATORY_CARE_PROVIDER_SITE_OTHER)
Admission: EM | Admit: 2018-04-23 | Discharge: 2018-04-23 | Disposition: A | Discharge status: Home / Self Care | Payer: BLUE CROSS/BLUE SHIELD | Source: Home / Self Care

## 2018-04-23 ENCOUNTER — Emergency Department (HOSPITAL_BASED_OUTPATIENT_CLINIC_OR_DEPARTMENT_OTHER): Payer: BLUE CROSS/BLUE SHIELD

## 2018-04-23 DIAGNOSIS — Z87891 Personal history of nicotine dependence: Secondary | ICD-10-CM | POA: Diagnosis not present

## 2018-04-23 DIAGNOSIS — M62830 Muscle spasm of back: Secondary | ICD-10-CM | POA: Diagnosis not present

## 2018-04-23 DIAGNOSIS — R1030 Lower abdominal pain, unspecified: Secondary | ICD-10-CM | POA: Insufficient documentation

## 2018-04-23 DIAGNOSIS — M549 Dorsalgia, unspecified: Secondary | ICD-10-CM | POA: Diagnosis present

## 2018-04-23 DIAGNOSIS — Z79899 Other long term (current) drug therapy: Secondary | ICD-10-CM | POA: Diagnosis not present

## 2018-04-23 DIAGNOSIS — M545 Low back pain, unspecified: Secondary | ICD-10-CM

## 2018-04-23 DIAGNOSIS — R1084 Generalized abdominal pain: Secondary | ICD-10-CM | POA: Diagnosis not present

## 2018-04-23 DIAGNOSIS — Z7982 Long term (current) use of aspirin: Secondary | ICD-10-CM | POA: Diagnosis not present

## 2018-04-23 DIAGNOSIS — R350 Frequency of micturition: Secondary | ICD-10-CM | POA: Diagnosis not present

## 2018-04-23 DIAGNOSIS — I10 Essential (primary) hypertension: Secondary | ICD-10-CM | POA: Diagnosis not present

## 2018-04-23 LAB — POCT URINALYSIS DIP (MANUAL ENTRY)
Bilirubin, UA: NEGATIVE
Glucose, UA: NEGATIVE mg/dL
Ketones, POC UA: NEGATIVE mg/dL
LEUKOCYTES UA: NEGATIVE
Nitrite, UA: NEGATIVE
PH UA: 6 (ref 5.0–8.0)
PROTEIN UA: NEGATIVE mg/dL
Spec Grav, UA: 1.025 (ref 1.010–1.025)
UROBILINOGEN UA: 0.2 U/dL

## 2018-04-23 LAB — CBC WITH DIFFERENTIAL/PLATELET
BASOS ABS: 0 10*3/uL (ref 0.0–0.1)
BASOS PCT: 0 %
Eosinophils Absolute: 0.3 10*3/uL (ref 0.0–0.7)
Eosinophils Relative: 6 %
HCT: 38.3 % (ref 36.0–46.0)
Hemoglobin: 12.8 g/dL (ref 12.0–15.0)
LYMPHS PCT: 33 %
Lymphs Abs: 2 10*3/uL (ref 0.7–4.0)
MCH: 31.4 pg (ref 26.0–34.0)
MCHC: 33.4 g/dL (ref 30.0–36.0)
MCV: 94.1 fL (ref 78.0–100.0)
Monocytes Absolute: 0.7 10*3/uL (ref 0.1–1.0)
Monocytes Relative: 12 %
Neutro Abs: 2.9 10*3/uL (ref 1.7–7.7)
Neutrophils Relative %: 49 %
PLATELETS: 243 10*3/uL (ref 150–400)
RBC: 4.07 MIL/uL (ref 3.87–5.11)
RDW: 12.6 % (ref 11.5–15.5)
WBC: 5.9 10*3/uL (ref 4.0–10.5)

## 2018-04-23 LAB — BASIC METABOLIC PANEL
ANION GAP: 8 (ref 5–15)
BUN: 24 mg/dL — ABNORMAL HIGH (ref 6–20)
CALCIUM: 8.9 mg/dL (ref 8.9–10.3)
CO2: 29 mmol/L (ref 22–32)
Chloride: 104 mmol/L (ref 98–111)
Creatinine, Ser: 1.03 mg/dL — ABNORMAL HIGH (ref 0.44–1.00)
GFR calc Af Amer: >60 mL/min (ref >60)
Glucose, Bld: 107 mg/dL — ABNORMAL HIGH (ref 70–99)
POTASSIUM: 3.4 mmol/L — AB (ref 3.5–5.1)
SODIUM: 141 mmol/L (ref 135–145)

## 2018-04-23 NOTE — ED Provider Notes (Signed)
[facility]    CSN: [id number] Arrival date & time: 04/23/18  1605     History   Chief Complaint Chief Complaint  Patient presents with  . Back Pain  . Abdominal Pain  . Nausea    HPI Wanda Valencia is a 52 y.o. female.   The history is provided by the patient. No language interpreter was used.  Back Pain  Location:  Lumbar spine Quality:  Stabbing Pain severity:  Moderate Pain is:  Same all the time Onset quality:  Gradual Duration:  2 weeks Timing:  Constant Progression:  Worsening Context: not falling   Relieved by:  Nothing Worsened by:  Nothing Associated symptoms: abdominal pain   Risk factors: no recent surgery   Abdominal Pain   Pt complains of pain in left low back for 2 weeks.  Pt describes sharp shooting pain.  Pt reports she has some pain in left lower abdomen.  Pt was seen at primary care.  Urine culture negative.  Pt report pain has continued. Pt now having left flank pain and pain in left lower abdomen.   Past Medical History:  Diagnosis Date  . Arthritis    "right knee and thumb" (05/08/2015)  . Chronic bronchitis (HCC)    "born w/it; get it alot"  . Family history of adverse reaction to anesthesia    "my sister has reactions to about anything; anesthesia included"  . GERD (gastroesophageal reflux disease)   . Hx: UTI (urinary tract infection)   . Hypertension   . Incontinence   . Kidney stones   . Overactive bladder   . 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 Urine leukocytes 04/06/2018  . History of acute pyelonephritis 04/06/2018  . Pes anserine bursitis 01/18/2018  . Rhinitis 11/28/2016  . Skin tag 07/29/2016  . Cystitis 04/15/2016  . History of arthroplasty of right knee 05/08/2015  . Plantar fasciitis, left 08/25/2014  . Fatty liver disease, nonalcoholic 06/17/2014  . Cholelithiases 06/17/2014  . History of kidney stones 05/20/2014  . Hyperlipidemia  LDL goal < 100 11/29/2012  . Annual physical exam 07/17/2012  . 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 Obesity 07/17/2012  . Essential hypertension, benign 07/17/2012  . Right first Metacarpophalangeal joint pain 04/30/2012    Past Surgical History:  Procedure Laterality Date  . CESAREAN SECTION  1989; 1991  . CYSTOSCOPY W/ STONE MANIPULATION  09/2006  . JOINT REPLACEMENT    . 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   Procedure: TOTAL KNEE ARTHROPLASTY;  Surgeon: Jodi GeraldsJohn Graves, MD;  Location: [facility] OR;  Service: Orthopedics;  Laterality: Right;  . TUBAL LIGATION  10/14/2011    OB History    Gravida  2   Para  2   Term  2   Preterm      AB      Living  2     SAB      TAB      Ectopic      Multiple      Live Births               Home Medications    Prior to Admission medications   Medication Sig Start Date End Date Taking? Authorizing Provider  aspirin 81 MG tablet Take 81 mg by mouth daily.    [provider]  cetirizine-pseudoephedrine (ZYRTEC-D) 5-120 MG tablet Take 1  tablet by mouth 2 (two) times daily. 01/18/18   Monica Becton, MD  CRANBERRY PO Take 1 capsule by mouth.     [provider]  FIBER PO Take 2 capsules by mouth daily. Chewable    [provider]  fluticasone (FLONASE) 50 MCG/ACT nasal spray One spray in each nostril twice a day, use left hand for right nostril, and right hand for left nostril. 01/18/18   Monica Becton, MD  ibuprofen (ADVIL,MOTRIN) 800 MG tablet TAKE 1 TABLET BY MOUTH EVERY 8 HOURS AS NEEDED 04/18/18   Monica Becton, MD  lisinopril-hydrochlorothiazide (PRINZIDE,ZESTORETIC) 20-25 MG tablet TAKE 1 TABLET BY MOUTH DAILY. 12/30/17   Monica Becton, MD  ranitidine (ZANTAC) 150 MG tablet Take 150 mg by mouth daily.     [provider]    Family History Family History  Problem Relation Age of Onset  . Hypertension  Mother   . Depression Mother   . Hypertension Father   . Cancer Father        tongue/kidney  . Diabetes Father     Social History Social History   Tobacco Use  . Smoking status: Former Smoker    Packs/day: 0.10    Years: 2.00    Pack years: 0.20    Types: Cigarettes    Last attempt to quit: 09/27/1983    Years since quitting: 34.5  . Smokeless tobacco: Never Used  Substance Use Topics  . Alcohol use: No  . Drug use: No     Allergies   Patient has no known allergies.   Review of Systems Review of Systems  Gastrointestinal: Positive for abdominal pain.  Musculoskeletal: Positive for back pain.     Physical Exam Triage Vital Signs ED Triage Vitals  Enc Vitals Group     BP 04/23/18 1708 111/73     Pulse Rate 04/23/18 1708 74     Resp --      Temp 04/23/18 1708 98.1 F (36.7 C)     Temp Source 04/23/18 1708 Oral     SpO2 04/23/18 1708 96 %     Weight 04/23/18 1710 234 lb (106.1 kg)     Height 04/23/18 1710 5\' 3"  (1.6 m)     Head Circumference --      Peak Flow --      Pain Score 04/23/18 1710 3     Pain Loc --      Pain Edu? --      Excl. in GC? --    No data found.  Updated Vital Signs BP 111/73 (BP Location: Right Arm)   Pulse 74   Temp 98.1 F (36.7 C) (Oral)   Ht 5\' 3"  (1.6 m)   Wt 234 lb (106.1 kg)   LMP 09/29/2011   SpO2 96%   BMI 41.45 kg/m   Visual Acuity Right Eye Distance:   Left Eye Distance:   Bilateral Distance:    Right Eye Near:   Left Eye Near:    Bilateral Near:     Physical Exam  Constitutional: She appears well-developed and well-nourished.  HENT:  Head: Normocephalic and atraumatic.  Mouth/Throat: Oropharynx is clear and moist.  Cardiovascular: Normal rate, regular rhythm and normal heart sounds.  Pulmonary/Chest: Effort normal.  Abdominal: Normal appearance and bowel sounds are normal. There is tenderness in the left lower quadrant.  Neurological: She is alert.  Skin: Skin is warm.  Psychiatric: She has a normal  mood and affect.  Nursing note and  vitals reviewed.    UC Treatments / Results  Labs (all labs ordered are listed, but only abnormal results are displayed) Labs Reviewed  POCT URINALYSIS DIP (MANUAL ENTRY) - Abnormal; Notable for the following components:      Result Value   Blood, UA trace-intact (*)    All other components within normal limits    EKG None  Radiology No results found.  Procedures Procedures (including critical care time)  Medications Ordered in UC Medications - No data to display  Initial Impression / Assessment and Plan / UC Course  I have reviewed the triage vital signs and the nursing notes.  Pertinent labs & imaging results that were available during my care of the patient were reviewed by me and considered in my medical decision making (see chart for details).     MDM  Ua negative,  Pt advised to go to [facility] evaluation .  Pt has had kidney stones in the past.   Final Clinical Impressions(s) / UC Diagnoses   Final diagnoses:  Generalized abdominal pain   Discharge Instructions   None    ED Prescriptions    None     Controlled Substance Prescriptions [place] Controlled Substance Registry consulted? Not Applicable  An After Visit Summary was printed and given to the patient.    Elson Areas, [place] 04/23/18 1752

## 2018-04-23 NOTE — Discharge Instructions (Addendum)
You were evaluated in the emergency department for low back pain with associated spasms.  You had blood work and a CAT scan that did not show an obvious cause of your symptoms.  You should continue your ibuprofen 3 times a day with food and follow-up with your doctor for further evaluation.  Please return if any concerns.

## 2018-04-23 NOTE — ED Triage Notes (Signed)
Back pain. Hx of UTI's. She took Cipro for possible UTI last week. Nausea and left flank pain. [facility].

## 2018-04-23 NOTE — ED Provider Notes (Signed)
[facility] EMERGENCY DEPARTMENT Provider Note   CSN: [id number] Arrival date & time: 04/23/18  1947     History   Chief Complaint Chief Complaint  Patient presents with  . Back Pain    HPI Wanda Valencia is a 52 y.o. female.  She is complaining of 2 weeks of low back pain.  He was a general soreness but over the last couple of days there is been more intense spasm associated with it is associate with a little bit of urinary frequency and some low abdominal pain.  She is been nauseous but no fever.  She got seen by her PCP last week and they checked her urine and was told it was negative.  She went to urgent care today and also again had a negative urine but was referred here to check some blood work and get a CAT scan.  She does have a history of kidney stones.  There was no trauma.  The history is provided by the patient.  Back Pain   This is a new problem. The current episode started more than 1 week ago. The problem occurs constantly. The problem has been gradually worsening. The pain is associated with no known injury. The pain is present in the lumbar spine. The quality of the pain is described as stabbing. The pain does not radiate. The pain is moderate. The symptoms are aggravated by certain positions. Associated symptoms include abdominal pain and dysuria. Pertinent negatives include no chest pain, no fever, no numbness, no bowel incontinence, no perianal numbness, no bladder incontinence, no leg pain, no paresthesias and no paresis. She has tried NSAIDs for the symptoms. The treatment provided mild relief.    Past Medical History:  Diagnosis Date  . Arthritis    "right knee and thumb" (05/08/2015)  . Chronic bronchitis (HCC)    "born w/it; get it alot"  . Family history of adverse reaction to anesthesia    "my sister has reactions to about anything; anesthesia included"  . GERD (gastroesophageal reflux disease)   . Hx: UTI (urinary tract infection)   . Hypertension    . Incontinence   . Kidney stones   . Overactive bladder   . 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 Urine leukocytes 04/06/2018  . History of acute pyelonephritis 04/06/2018  . Pes anserine bursitis 01/18/2018  . Rhinitis 11/28/2016  . Skin tag 07/29/2016  . Cystitis 04/15/2016  . History of arthroplasty of right knee 05/08/2015  . Plantar fasciitis, left 08/25/2014  . Fatty liver disease, nonalcoholic 06/17/2014  . Cholelithiases 06/17/2014  . History of kidney stones 05/20/2014  . Hyperlipidemia LDL goal < 100 11/29/2012  . Annual physical exam 07/17/2012  . 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 Obesity 07/17/2012  . Essential hypertension, benign 07/17/2012  . Right first Metacarpophalangeal joint pain 04/30/2012    Past Surgical History:  Procedure Laterality Date  . CESAREAN SECTION  1989; 1991  . CYSTOSCOPY W/ STONE MANIPULATION  09/2006  . JOINT REPLACEMENT    . 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   Procedure: TOTAL KNEE ARTHROPLASTY;  Surgeon: Jodi Geralds, MD;  Location: [facility] OR;  Service: Orthopedics;  Laterality: Right;  . TUBAL LIGATION  10/14/2011     OB History    Gravida  2   Para  2   Term  2   Preterm  AB      Living  2     SAB      TAB      Ectopic      Multiple      Live Births               Home Medications    Prior to Admission medications   Medication Sig Start Date End Date Taking? Authorizing Provider  aspirin 81 MG tablet Take 81 mg by mouth daily.    [provider]  cetirizine-pseudoephedrine (ZYRTEC-D) 5-120 MG tablet Take 1 tablet by mouth 2 (two) times daily. 01/18/18   Monica Bectonhekkekandam, Thomas J, MD  CRANBERRY PO Take 1 capsule by mouth.     [provider]  FIBER PO Take 2 capsules by mouth daily. Chewable    [provider]  fluticasone (FLONASE) 50 MCG/ACT nasal  spray One spray in each nostril twice a day, use left hand for right nostril, and right hand for left nostril. 01/18/18   Monica Bectonhekkekandam, Thomas J, MD  ibuprofen (ADVIL,MOTRIN) 800 MG tablet TAKE 1 TABLET BY MOUTH EVERY 8 HOURS AS NEEDED 04/18/18   Monica Bectonhekkekandam, Thomas J, MD  lisinopril-hydrochlorothiazide (PRINZIDE,ZESTORETIC) 20-25 MG tablet TAKE 1 TABLET BY MOUTH DAILY. 12/30/17   Monica Bectonhekkekandam, Thomas J, MD  ranitidine (ZANTAC) 150 MG tablet Take 150 mg by mouth daily.     [provider]    Family History Family History  Problem Relation Age of Onset  . Hypertension Mother   . Depression Mother   . Hypertension Father   . Cancer Father        tongue/kidney  . Diabetes Father     Social History Social History   Tobacco Use  . Smoking status: Former Smoker    Packs/day: 0.10    Years: 2.00    Pack years: 0.20    Types: Cigarettes    Last attempt to quit: 09/27/1983    Years since quitting: 34.5  . Smokeless tobacco: Never Used  Substance Use Topics  . Alcohol use: No  . Drug use: No     Allergies   Patient has no known allergies.   Review of Systems Review of Systems  Constitutional: Negative for fever.  HENT: Negative for sore throat.   Eyes: Negative for visual disturbance.  Respiratory: Negative for shortness of breath.   Cardiovascular: Negative for chest pain.  Gastrointestinal: Positive for abdominal pain. Negative for bowel incontinence.  Genitourinary: Positive for dysuria and frequency. Negative for bladder incontinence, hematuria, vaginal bleeding and vaginal discharge.  Musculoskeletal: Positive for back pain. Negative for neck pain.  Skin: Negative for rash.  Neurological: Negative for numbness and paresthesias.     Physical Exam Updated Vital Signs BP 124/82   Pulse 77   Temp 98.4 F (36.9 C) (Oral)   Resp 20   Ht 5\' 3"  (1.6 m)   Wt 106.1 kg (234 lb)   LMP 09/29/2011   SpO2 98%   BMI 41.45 kg/m   Physical Exam  Constitutional: She  appears well-developed and well-nourished. No distress.  HENT:  Head: Normocephalic and atraumatic.  Eyes: Conjunctivae are normal.  Neck: Neck supple.  Cardiovascular: Normal rate, regular rhythm, normal heart sounds and intact distal pulses.  No murmur heard. Pulmonary/Chest: Effort normal and breath sounds normal. No respiratory distress.  Abdominal: Soft. There is no tenderness.  Musculoskeletal: Normal range of motion. She exhibits deformity. She exhibits no edema.  She has some reproducible tenderness in her left paralumbar area.  She also has exacerbation of pain when she sits up or twists.  No CVA tenderness.  Neurological: She is alert.  Skin: Skin is warm and dry. Capillary refill takes less than 2 seconds.  Psychiatric: She has a normal mood and affect.  Nursing note and vitals reviewed.    ED Treatments / Results  Labs (all labs ordered are listed, but only abnormal results are displayed) Labs Reviewed  BASIC METABOLIC PANEL - Abnormal; Notable for the following components:      Result Value   Potassium 3.4 (*)    Glucose, Bld 107 (*)    BUN 24 (*)    Creatinine, Ser 1.03 (*)    All other components within normal limits  CBC WITH DIFFERENTIAL/PLATELET  URINALYSIS, ROUTINE W REFLEX MICROSCOPIC    EKG None  Radiology Ct Renal Stone Study  Result Date: 04/23/2018 CLINICAL DATA:  Left flank pain for 2 weeks. EXAM: CT ABDOMEN AND PELVIS WITHOUT CONTRAST TECHNIQUE: Multidetector CT imaging of the abdomen and pelvis was performed following the standard protocol without IV contrast. COMPARISON:  None. FINDINGS: Lower chest: Scattered hypoventilatory and subsegmental atelectasis in the lung bases. No pleural fluid or consolidation. Hepatobiliary: The liver is enlarged spanning 20 cm cranial caudal with diffusely decreased density consistent with steatosis. No discrete hepatic lesion on noncontrast exam. Gallstones within partially distended gallbladder without  pericholecystic inflammation. No biliary dilatation. Pancreas: No ductal dilatation or inflammation. Spleen: Normal in size. Few vague low densities at the inferior hilum are incompletely characterized, but likely hemangiomas or cysts. Adrenals/Urinary Tract: No adrenal nodule. No hydronephrosis. Minimal symmetric bilateral perinephric stranding. Bilateral nonobstructing stones in both kidneys, least 2 stones on the left and 3 stones on the right. Both ureters are decompressed. Urinary bladder is partially distended, no bladder stone or wall thickening. Stomach/Bowel: Small hiatal hernia. Stomach distended with ingested contents. No bowel wall thickening, inflammatory change or obstruction. Normal appendix. Small colonic stool burden. No significant diverticular disease. Vascular/Lymphatic: Mild aortic atherosclerosis without aneurysm. No enlarged abdominal or pelvic lymph nodes. Reproductive: Uterus and bilateral adnexa are unremarkable. Other: No free air, free fluid, or intra-abdominal fluid collection. Musculoskeletal: There are no acute or suspicious osseous abnormalities. Scattered facet arthropathy in the lumbar spine. Small vertebral body hemangioma within T11, incidental. IMPRESSION: 1. Bilateral nonobstructing nephrolithiasis. No hydronephrosis or obstructive uropathy. 2. Mild hepatomegaly with hepatic steatosis. Cholelithiasis without gallbladder inflammation. 3. Tiny hiatal hernia. 4.  Aortic Atherosclerosis (ICD10-I70.0). Electronically Signed   By: Rubye Oaks M.D.   On: 04/23/2018 21:08    Procedures Procedures (including critical care time)  Medications Ordered in ED Medications - No data to display   Initial Impression / Assessment and Plan / ED Course  I have reviewed the triage vital signs and the nursing notes.  Pertinent labs & imaging results that were available during my care of the patient were reviewed by me and considered in my medical decision making (see chart for  details).  Clinical Course as of Apr 23 2321  Mon Apr 23, 2018  2146 Patient CT imaging and lab results do not show any acute findings to explain her low back pain.  This is likely muscular and I relayed that to the patient.  She is going to continue to take her ibuprofen 800 and follow-up with your doctor for further management.  She understands to return if any worsening symptoms.   [MB]    Clinical Course User Index [MB] Terrilee Files, MD     Final  Clinical Impressions(s) / ED Diagnoses   Final diagnoses:  Acute left-sided low back pain without sciatica  Muscle spasm of back    ED Discharge Orders    None       Terrilee Files, MD 04/23/18 2322

## 2018-04-23 NOTE — ED Triage Notes (Signed)
Pt saw Wanda Valencia over a week ago with possibly UTI, on liquid cipro, culture came back negative.  Stopped the cipro.  Yesterday and today having sharp left side back pain, lower abdominal pain, and nausea

## 2018-04-23 NOTE — ED Notes (Signed)
Pt gave urine sample at [facility] prior to arrival

## 2018-04-23 NOTE — ED Notes (Signed)
Patient transported to CT 

## 2018-04-26 DIAGNOSIS — K8020 Calculus of gallbladder without cholecystitis without obstruction: Secondary | ICD-10-CM

## 2018-04-26 HISTORY — DX: Calculus of gallbladder without cholecystitis without obstruction: K80.20

## 2018-05-22 ENCOUNTER — Other Ambulatory Visit: Payer: Self-pay | Admitting: Sports Medicine

## 2018-05-22 DIAGNOSIS — Z1239 Encounter for other screening for malignant neoplasm of breast: Secondary | ICD-10-CM

## 2018-06-17 ENCOUNTER — Other Ambulatory Visit: Payer: Self-pay | Admitting: Sports Medicine

## 2018-06-29 ENCOUNTER — Ambulatory Visit: Payer: BLUE CROSS/BLUE SHIELD | Admitting: Sports Medicine

## 2018-06-29 ENCOUNTER — Encounter: Payer: Self-pay | Admitting: Sports Medicine

## 2018-06-29 DIAGNOSIS — J0110 Acute frontal sinusitis, unspecified: Secondary | ICD-10-CM

## 2018-06-29 MED ORDER — ONDANSETRON 8 MG PO TBDP: 8.0000 mg | ORAL_TABLET | Freq: Three times a day (TID) | ORAL | 3 refills | Status: DC | PRN

## 2018-06-29 MED ORDER — PREDNISONE 50 MG PO TABS: ORAL_TABLET | ORAL | 0 refills | Status: DC

## 2018-06-29 MED ORDER — AZITHROMYCIN 250 MG PO TABS: ORAL_TABLET | ORAL | 0 refills | Status: DC

## 2018-06-29 NOTE — Assessment & Plan Note (Signed)
Present now for 3 weeks and persistence after 8 days of amoxicillin. Switching to the second line agent azithromycin, adding 5 days of prednisone and Zofran. Return to see me if no better 2 weeks.

## 2018-06-29 NOTE — Patient Instructions (Signed)

## 2018-06-29 NOTE — Progress Notes (Signed)
Subjective:    CC: Facial and sinus pressure  HPI: For 3 weeks now this pleasant 52 year old female has had pain and pressure over the front of her forehead with radiation to the left upper teeth, may be a little bit to the ear.  Moderate, persistent, she does have some copious nasal discharge nonpurulent.  [facility], its been 8 days on amoxicillin and she has noted no improvement.  Headaches are without photophobia, phonophobia, moderate nausea.  No focal neurologic symptoms, no visual changes.  No trauma.  I reviewed the past medical history, family history, social history, surgical history, and allergies today and no changes were needed.  Please see the problem list section below in epic for further details.  Past Medical History: Past Medical History:  Diagnosis Date  . Arthritis    "right knee and thumb" (05/08/2015)  . Chronic bronchitis (HCC)    "born w/it; get it alot"  . Family history of adverse reaction to anesthesia    "my sister has reactions to about anything; anesthesia included"  . GERD (gastroesophageal reflux disease)   . Hx: UTI (urinary tract infection)   . Hypertension   . Incontinence   . Kidney stones   . Overactive bladder   . 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 CESAREAN SECTION  1989; 1991  . CYSTOSCOPY W/ STONE MANIPULATION  09/2006  . JOINT REPLACEMENT    . 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   Procedure: TOTAL KNEE ARTHROPLASTY;  Surgeon: Jodi Geralds, MD;  Location: [facility] OR;  Service: Orthopedics;  Laterality: Right;  . TUBAL LIGATION  10/14/2011   Social History: Social History   Socioeconomic History  . Marital status: Married    Spouse name: Not on file  . Number of children: Not on file  . Years of education: Not on file  . Highest education level: Not on  file  Occupational History  . Not on file  Social Needs  . Financial resource strain: Not on file  . Food insecurity:    Worry: Not on file    Inability: Not on file  . Transportation needs:    Medical: Not on file    Non-medical: Not on file  Tobacco Use  . Smoking status: Former Smoker    Packs/day: 0.10    Years: 2.00    Pack years: 0.20    Types: Cigarettes    Last attempt to quit: 09/27/1983    Years since quitting: 34.7  . Smokeless tobacco: Never Used  Substance and Sexual Activity  . Alcohol use: No  . Drug use: No  . Sexual activity: Yes    Birth control/protection: Post-menopausal  Lifestyle  . Physical activity:    Days per week: Not on file    Minutes per session: Not on file  . Stress: Not on file  Relationships  . Social connections:    Talks on phone: Not on file    Gets together: Not on file    Attends religious service: Not on file    Active member of club or organization: Not on file    Attends meetings of clubs or organizations: Not on file    Relationship status: Not on file  Other Topics Concern  . Not on file  Social History Narrative  . Not on file   Family  History: Family History  Problem Relation Age of Onset  . Hypertension Mother   . Depression Mother   . Hypertension Father   . Cancer Father        tongue/kidney  . Diabetes Father    Allergies: No Known Allergies Medications: See med rec.  Review of Systems: No fevers, chills, night sweats, weight loss, chest pain, or shortness of breath.   Objective:    General: Well Developed, well nourished, and in no acute distress.  Neuro: Alert and oriented x3, extra-ocular muscles intact, sensation grossly intact.  Cranial nerves II through XII are intact, motor, sensory, coordinative functions are all intact. HEENT: Normocephalic, atraumatic, pupils equal round reactive to light, neck supple, no masses, no lymphadenopathy, thyroid nonpalpable.  Tender to palpation over the frontal  sinuses, external ear canals, nasopharynx, oropharynx unremarkable. Skin: Warm and dry, no rashes. Cardiac: Regular rate and rhythm, no murmurs rubs or gallops, no lower extremity edema.  Respiratory: Clear to auscultation bilaterally. Not using accessory muscles, speaking in full sentences.  Impression and Recommendations:    Acute frontal sinusitis Present now for 3 weeks and persistence after 8 days of amoxicillin. Switching to the second line agent azithromycin, adding 5 days of prednisone and Zofran. Return to see me if no better 2 weeks. ___________________________________________ Ihor Austin. Benjamin Stain, M.D., ABFM., CAQSM. Primary Care and Sports Medicine [facility]  Adjunct Instructor of Family Medicine  [employer]

## 2018-07-06 ENCOUNTER — Ambulatory Visit (INDEPENDENT_AMBULATORY_CARE_PROVIDER_SITE_OTHER): Payer: BLUE CROSS/BLUE SHIELD

## 2018-07-06 DIAGNOSIS — Z1239 Encounter for other screening for malignant neoplasm of breast: Secondary | ICD-10-CM

## 2018-07-06 DIAGNOSIS — Z1231 Encounter for screening mammogram for malignant neoplasm of breast: Secondary | ICD-10-CM

## 2018-07-10 ENCOUNTER — Ambulatory Visit: Payer: BLUE CROSS/BLUE SHIELD | Admitting: Sports Medicine

## 2018-07-16 ENCOUNTER — Ambulatory Visit (INDEPENDENT_AMBULATORY_CARE_PROVIDER_SITE_OTHER): Payer: BLUE CROSS/BLUE SHIELD

## 2018-07-16 ENCOUNTER — Encounter: Payer: Self-pay | Admitting: Sports Medicine

## 2018-07-16 ENCOUNTER — Ambulatory Visit: Payer: BLUE CROSS/BLUE SHIELD | Admitting: Sports Medicine

## 2018-07-16 DIAGNOSIS — M5412 Radiculopathy, cervical region: Secondary | ICD-10-CM

## 2018-07-16 DIAGNOSIS — M542 Cervicalgia: Secondary | ICD-10-CM

## 2018-07-16 DIAGNOSIS — K8020 Calculus of gallbladder without cholecystitis without obstruction: Secondary | ICD-10-CM | POA: Diagnosis not present

## 2018-07-16 DIAGNOSIS — K8050 Calculus of bile duct without cholangitis or cholecystitis without obstruction: Secondary | ICD-10-CM | POA: Diagnosis not present

## 2018-07-16 DIAGNOSIS — M5030 Other cervical disc degeneration, unspecified cervical region: Secondary | ICD-10-CM | POA: Insufficient documentation

## 2018-07-16 MED ORDER — PREDNISONE 50 MG PO TABS
ORAL_TABLET | ORAL | 0 refills | Status: DC
Start: 2018-07-16 — End: 2018-08-21

## 2018-07-16 NOTE — Progress Notes (Signed)
Subjective:    CC: Abdominal pain  HPI: This is a pleasant 52 year old female, for months she has had on and off discomfort in her right upper quadrant with occasional radiation to the right shoulder blade.  Moderate, persistent.  Occasionally her stools will turn light in color.  She has not linked to any specific type of food, no vomiting or diarrhea but she does have chronic nausea.  A CT scan done for nephrolithiasis showed significant cholelithiasis.  No evidence of choledocholithiasis.  In addition she is having pain in her neck with radiation to the left periscapular region, worse with prolonged downgaze.  No progressive weakness, trauma, fevers or chills.  I reviewed the past medical history, family history, social history, surgical history, and allergies today and no changes were needed.  Please see the problem list section below in epic for further details.  Past Medical History: Past Medical History:  Diagnosis Date  . Arthritis    "right knee and thumb" (05/08/2015)  . Chronic bronchitis (HCC)    "born w/it; get it alot"  . Family history of adverse reaction to anesthesia    "my sister has reactions to about anything; anesthesia included"  . GERD (gastroesophageal reflux disease)   . Hx: UTI (urinary tract infection)   . Hypertension   . Incontinence   . Kidney stones   . Overactive bladder   . 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 CESAREAN SECTION  1989; 1991  . CYSTOSCOPY W/ STONE MANIPULATION  09/2006  . JOINT REPLACEMENT    . 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   Procedure: TOTAL KNEE ARTHROPLASTY;  Surgeon: Jodi Geralds, MD;  Location: [facility] OR;  Service: Orthopedics;  Laterality: Right;  . TUBAL LIGATION  10/14/2011   Social History: Social History   Socioeconomic History  . Marital status: Married    Spouse name: Not on  file  . Number of children: Not on file  . Years of education: Not on file  . Highest education level: Not on file  Occupational History  . Not on file  Social Needs  . Financial resource strain: Not on file  . Food insecurity:    Worry: Not on file    Inability: Not on file  . Transportation needs:    Medical: Not on file    Non-medical: Not on file  Tobacco Use  . Smoking status: Former Smoker    Packs/day: 0.10    Years: 2.00    Pack years: 0.20    Types: Cigarettes    Last attempt to quit: 09/27/1983    Years since quitting: 34.8  . Smokeless tobacco: Never Used  Substance and Sexual Activity  . Alcohol use: No  . Drug use: No  . Sexual activity: Yes    Birth control/protection: Post-menopausal  Lifestyle  . Physical activity:    Days per week: Not on file    Minutes per session: Not on file  . Stress: Not on file  Relationships  . Social connections:    Talks on phone: Not on file    Gets together: Not on file    Attends religious service: Not on file    Active member of club or organization: Not on file    Attends meetings of clubs or organizations: Not on file    Relationship status: Not on file  Other Topics Concern  .  Not on file  Social History Narrative  . Not on file   Family History: Family History  Problem Relation Age of Onset  . Hypertension Mother   . Depression Mother   . Hypertension Father   . Cancer Father        tongue/kidney  . Diabetes Father    Allergies: No Known Allergies Medications: See med rec.  Review of Systems: No fevers, chills, night sweats, weight loss, chest pain, or shortness of breath.   Objective:    General: Well Developed, well nourished, and in no acute distress.  Neuro: Alert and oriented x3, extra-ocular muscles intact, sensation grossly intact.  HEENT: Normocephalic, atraumatic, pupils equal round reactive to light, neck supple, no masses, no lymphadenopathy, thyroid nonpalpable.  Skin: Warm and dry, no  rashes. Cardiac: Regular rate and rhythm, no murmurs rubs or gallops, no lower extremity edema.  Respiratory: Clear to auscultation bilaterally. Not using accessory muscles, speaking in full sentences. Neck: Negative spurling's Full neck range of motion Grip strength and sensation normal in bilateral hands Strength good C4 to T1 distribution No sensory change to C4 to T1 Reflexes normal Abdomen: Soft, nontender, nondistended, normal bowel sounds, no palpable masses, no guarding, rigidity, rebound tenderness, negative Murphy sign.  Impression and Recommendations:    Biliary colic Large amount of gallstones seen on CT. I am going to tee her up with a HIDA scan, symptoms are fairly classic for biliary colic. Referral for general surgery as well.  Radiculitis of left cervical region Periscapular distribution radicular symptoms. Home rehab exercises, 5 days of prednisone. Return in 1 month, MR for interventional planning if no better. ___________________________________________ Ihor Austin. Benjamin Stain, M.D., ABFM., CAQSM. Primary Care and Sports Medicine [facility]  Adjunct Professor of Family Medicine  [employer]

## 2018-07-16 NOTE — Assessment & Plan Note (Signed)
Periscapular distribution radicular symptoms. Home rehab exercises, 5 days of prednisone. Return in 1 month, MR for interventional planning if no better.

## 2018-07-16 NOTE — Patient Instructions (Signed)
Biliary Colic, Adult °Biliary colic is severe pain caused by a problem with a small organ in the upper right part of your belly (gallbladder). The gallbladder stores a digestive fluid produced in the liver (bile) that helps the body break down fat. Bile and other digestive enzymes are carried from the liver to the small intestine though tube-like structures (bile ducts). The gallbladder and the bile ducts form the biliary tract. °Sometimes hard deposits of digestive fluids form in the gallbladder (gallstones) and block the flow of bile from the gallbladder, causing biliary colic. This condition is also called a gallbladder attack. Gallstones can be as small as a grain of sand or as big as a golf ball. There could be just one gallstone in the gallbladder, or there could be many. °What are the causes? °Biliary colic is usually caused by gallstones. Less often, a tumor could block the flow of bile from the gallbladder and trigger biliary colic. °What increases the risk? °This condition is more likely to develop in: °· Women. °· People of Hispanic descent. °· People with a family history of gallstones. °· People who are obese. °· People who suddenly or quickly lose weight. °· People who eat a high-calorie, low-fiber diet that is rich in refined carbs (carbohydrates), such as white bread and white rice. °· People who have an intestinal disease that affects nutrient absorption, such as Crohn disease. °· People who have a metabolic condition, such as metabolic syndrome or diabetes. ° °What are the signs or symptoms? °Severe pain in the upper right side of the belly is the main symptom of biliary colic. You may feel this pain below the chest but above the hip. This pain often occurs at night or after eating a very fatty meal. This pain may get worse for up to an hour and last as long as 12 hours. In most cases, the pain fades (subsides) within a couple hours. °Other symptoms of this condition include: °· Nausea and  vomiting. °· Pain under the right shoulder. ° °How is this diagnosed? °This condition is diagnosed based on your medical history, your symptoms, and a physical exam. You may have tests, including: °· Blood tests to rule out infection or inflammation of the bile ducts, gallbladder, pancreas, or liver. °· Imaging studies such as: °? Ultrasound. °? CT scan. °? MRI. ° °In some cases, you may need to have an imaging study done using a small amount of radioactive material (nuclear medicine) to confirm the diagnosis. °How is this treated? °Treatment for this condition may include medicine to relieve your pain or nausea. If you have gallstones that are causing biliary colic, you may need surgery to remove the gallbladder (cholecystectomy). Gallstones can also be dissolved gradually with medicine. It may take months or years before the gallstones are completely gone. °Follow these instructions at home: °· Take over-the-counter and prescription medicines only as told by your health care provider. °· Drink enough fluid to keep your urine clear or pale yellow. °· Follow instructions from your health care provider about eating or drinking restrictions. These may include avoiding: °? Fatty, greasy, and fried foods. °? Any foods that make the pain worse. °? Overeating. °? Having a large meal after not eating for a while. °· Keep all follow-up visits as told by your health care provider. This is important. °How is this prevented? °Steps to prevent this condition include: °· Maintaining a healthy body weight. °· Getting regular exercise. °· Eating a healthy, high-fiber, low-fat diet. °· Limiting   how much sugar and refined carbs you eat, such as sweets, white flour, and white rice. ° °Contact a health care provider if: °· Your pain lasts more than 5 hours. °· You vomit. °· You have a fever and chills. °· Your pain gets worse. °Get help right away if: °· Your skin or the whites of your eyes look yellow (jaundice). °· Your have  tea-colored urine and light-colored stools. °· You are dizzy or you faint. °This information is not intended to replace advice given to you by your health care provider. Make sure you discuss any questions you have with your health care provider. °Document Released: 02/13/2006 Document Revised: 05/10/2016 Document Reviewed: 03/28/2016 °Elsevier Interactive Patient Education © 2018 [employer] ° °

## 2018-07-16 NOTE — Assessment & Plan Note (Signed)
Large amount of gallstones seen on CT. I am going to tee her up with a HIDA scan, symptoms are fairly classic for biliary colic. Referral for general surgery as well.

## 2018-07-17 LAB — COMPREHENSIVE METABOLIC PANEL
AG Ratio: 1.9 (calc) (ref 1.0–2.5)
Albumin: 4 g/dL (ref 3.6–5.1)
Alkaline phosphatase (APISO): 63 U/L (ref 33–130)
BUN: 20 mg/dL (ref 7–25)
CO2: 30 mmol/L (ref 20–32)
Calcium: 9.2 mg/dL (ref 8.6–10.4)
Chloride: 103 mmol/L (ref 98–110)
Potassium: 4.1 mmol/L (ref 3.5–5.3)
Sodium: 141 mmol/L (ref 135–146)
Total Bilirubin: 0.6 mg/dL (ref 0.2–1.2)
Total Protein: 6.1 g/dL (ref 6.1–8.1)

## 2018-07-17 LAB — CBC WITH DIFFERENTIAL/PLATELET
Basophils Absolute: 20 cells/uL (ref 0–200)
Basophils Relative: 0.4 %
Eosinophils Absolute: 230 {cells}/uL (ref 15–500)
Eosinophils Relative: 4.5 %
HCT: 39 % (ref 35.0–45.0)
Hemoglobin: 13.2 g/dL (ref 11.7–15.5)
Lymphs Abs: 1275 {cells}/uL (ref 850–3900)
MCH: 30.9 pg (ref 27.0–33.0)
MCHC: 33.8 g/dL (ref 32.0–36.0)
MCV: 91.3 fL (ref 80.0–100.0)
MPV: 12.3 fL (ref 7.5–12.5)
Monocytes Relative: 15.2 %
Neutro Abs: 2800 cells/uL (ref 1500–7800)
Neutrophils Relative %: 54.9 %
Platelets: 266 Thousand/uL (ref 140–400)
RBC: 4.27 Million/uL (ref 3.80–5.10)
RDW: 12.5 % (ref 11.0–15.0)
Total Lymphocyte: 25 %
WBC mixed population: 775 {cells}/uL (ref 200–950)
WBC: 5.1 Thousand/uL (ref 3.8–10.8)

## 2018-07-17 LAB — COMPREHENSIVE METABOLIC PANEL WITH GFR
ALT: 30 U/L — ABNORMAL HIGH (ref 6–29)
AST: 26 U/L (ref 10–35)
BUN/Creatinine Ratio: 18 (calc) (ref 6–22)
Creat: 1.11 mg/dL — ABNORMAL HIGH (ref 0.50–1.05)
Globulin: 2.1 g/dL (ref 1.9–3.7)
Glucose, Bld: 98 mg/dL (ref 65–99)

## 2018-07-17 LAB — AMYLASE: Amylase: 25 U/L (ref 21–101)

## 2018-07-17 LAB — LIPASE: Lipase: 24 U/L (ref 7–60)

## 2018-07-17 NOTE — Progress Notes (Signed)
Pt has seen results on MyChart and message also sent for patient to call back if any questions.

## 2018-07-23 ENCOUNTER — Ambulatory Visit (INDEPENDENT_AMBULATORY_CARE_PROVIDER_SITE_OTHER): Payer: BLUE CROSS/BLUE SHIELD | Admitting: Obstetrics & Gynecology

## 2018-07-23 ENCOUNTER — Encounter: Payer: Self-pay | Admitting: Obstetrics & Gynecology

## 2018-07-23 ENCOUNTER — Encounter: Payer: Self-pay | Admitting: Sports Medicine

## 2018-07-23 VITALS — Resp 16 | Ht 63.0 in | Wt 231.0 lb

## 2018-07-23 DIAGNOSIS — Z01419 Encounter for gynecological examination (general) (routine) without abnormal findings: Secondary | ICD-10-CM

## 2018-07-23 NOTE — Progress Notes (Signed)
Subjective:    Wanda Valencia is a 52 y.o. married P2 (3 grands) female who presents for an annual exam. The patient has no complaints today. The patient is sexually active. GYN screening history: last pap: was normal. The patient wears seatbelts: yes. The patient participates in regular exercise: yes. ( babysits her 1 yo granddaughter) Has the patient ever been transfused or tattooed?: no. The patient reports that there is not domestic violence in her life.   Menstrual History: OB History    Gravida  2   Para  2   Term  2   Preterm      AB      Living  2     SAB      TAB      Ectopic      Multiple      Live Births              Menarche age: 26 Patient's last menstrual period was 09/29/2011.    The following portions of the patient's history were reviewed and updated as appropriate: allergies, current medications, past family history, past medical history, past social history, past surgical history and problem list.  Review of Systems Pertinent items are noted in HPI.   S/p BTL Married for 32 years Periods stopped in 2013 FH- + breast cancer in her sister diagnosed at 37 yo and her genetic testing was negative, + cervical cancer in her paternal aunt, no colon cancer Had a colonoscopy about 31-50 yo Works at [employer], retiring in 2 months Mammogram and flu vaccine UTD She owned a Cytogeneticist in the past.    Objective:    Resp 16   Ht 5\' 3"  (1.6 m)   Wt 231 lb (104.8 kg)   LMP 09/29/2011   BMI 40.92 kg/m   General Appearance:    Alert, cooperative, no distress, appears stated age  Head:    Normocephalic, without obvious abnormality, atraumatic  Eyes:    PERRL, conjunctiva/corneas clear, EOM's intact, fundi    benign, both eyes  Ears:    Normal TM's and external ear canals, both ears  Nose:   Nares normal, septum midline, mucosa normal, no drainage    or sinus tenderness  Throat:   Lips, mucosa, and tongue normal; teeth and gums normal  Neck:   Supple,  symmetrical, trachea midline, no adenopathy;    thyroid:  no enlargement/tenderness/nodules; no carotid   bruit or JVD  Back:     Symmetric, no curvature, ROM normal, no CVA tenderness  Lungs:     Clear to auscultation bilaterally, respirations unlabored  Chest Wall:    No tenderness or deformity   Heart:    Regular rate and rhythm, S1 and S2 normal, no murmur, rub   or gallop  Breast Exam:    No tenderness, masses, or nipple abnormality  Abdomen:     Soft, non-tender, bowel sounds active all four quadrants,    no masses, no organomegaly  Genitalia:    Normal female without lesion, discharge or tenderness, moderate atrophy, no masses discovered with bimanual exam     Extremities:   Extremities normal, atraumatic, no cyanosis or edema  Pulses:   2+ and symmetric all extremities  Skin:   Skin color, texture, turgor normal, no rashes or lesions  Lymph nodes:   Cervical, supraclavicular, and axillary nodes normal  Neurologic:   CNII-XII intact, normal strength, sensation and reflexes    throughout  .    Assessment:  Healthy female exam.    Plan:     Discussed healthy lifestyle modifications.

## 2018-08-02 ENCOUNTER — Ambulatory Visit (HOSPITAL_COMMUNITY): Payer: BLUE CROSS/BLUE SHIELD

## 2018-08-21 ENCOUNTER — Ambulatory Visit: Payer: BLUE CROSS/BLUE SHIELD | Admitting: Gastroenterology

## 2018-08-21 ENCOUNTER — Encounter: Payer: Self-pay | Admitting: Gastroenterology

## 2018-08-21 VITALS — BP 122/70 | HR 90 | Ht 63.0 in | Wt 233.2 lb

## 2018-08-21 DIAGNOSIS — R1011 Right upper quadrant pain: Secondary | ICD-10-CM

## 2018-08-21 DIAGNOSIS — K8020 Calculus of gallbladder without cholecystitis without obstruction: Secondary | ICD-10-CM

## 2018-08-21 DIAGNOSIS — K219 Gastro-esophageal reflux disease without esophagitis: Secondary | ICD-10-CM | POA: Diagnosis not present

## 2018-08-21 NOTE — Progress Notes (Signed)
Chief Complaint: RUQ pain, cholelithiasis   Referring Provider:     Dr. Rosendo Gros    HPI:     Wanda Valencia is a 52 y.o. female referred to the Gastroenterology Clinic for evaluation of RUQ pain in July 2019. Was evaluated by CT which was notable for mild hepatomegaly with hepatic steatosis, gallstones with partially distended gallbladder but no pericholecystic inflammation, choledocholithiasis, and no duct dilatation.  Otherwise normal-appearing pancreas, spleen, small hiatal hernia with otherwise normal stomach, intestine, colon.  She was evaluated by Dr. Rosendo Gros at [facility] with referral to GI for evaluation of alternate GI luminal source of pain.  She states the pain started in late July, lasting a few weeks, then improved. Had nausea starting in Sept x4 weeks or so. Saw her PCM on 07/16/18 with ongoing sxs.  Ordered labs which were normal as noted below, ordered HIDA scan, but not yet completed by patient, and referred to general surgery as above.  Had melena last week x3 days which has since resolved. No prior similar sxs.  No preceding new medications or OTC.  No associated lightheadedness, CP, SOB.  Hx of GERD, well controled with Prevacid.   Recent labs notable for normal CBC, CMP, amylase, lipase.   Mother with hx of GERD c/b Barretts Esophagus and esophageal stricture requiring serial dilation. Otherwise, no known family history of CRC, GI malignancy, liver disease, pancreatic disease, or IBD.   Endoscopic history: -Colonoscopy (2016 at [facility]): 1 benign polyp with recommendation to rpeeta in 10 years per patient.    Past Medical History:  Diagnosis Date  . Arthritis    "right knee and thumb" (05/08/2015)  . Chronic bronchitis ([place])    "born w/it; get it alot"  . Family history of adverse reaction to anesthesia    "my sister has reactions to about anything; anesthesia included"  . GERD (gastroesophageal reflux disease)   . Hx: UTI (urinary  tract infection)   . Hypertension   . Incontinence   . Kidney stones   . Overactive bladder   . 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 [place]; 1991  . CYSTOSCOPY W/ STONE MANIPULATION  09/2006  . JOINT REPLACEMENT    . 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   Procedure: TOTAL KNEE ARTHROPLASTY;  Surgeon: Dorna Leitz, MD;  Location: [facility];  Service: Orthopedics;  Laterality: Right;  . TUBAL LIGATION  10/14/2011   Family History  Problem Relation Age of Onset  . Hypertension Mother   . Depression Mother   . Hypertension Father   . Cancer Father        tongue/kidney  . Diabetes Father    Social History   Tobacco Use  . Smoking status: Former Smoker    Packs/day: 0.10    Years: 2.00    Pack years: 0.20    Types: Cigarettes    Last attempt to quit: 09/27/1983    Years since quitting: 34.9  . Smokeless tobacco: Never Used  Substance Use Topics  . Alcohol use: No  . Drug use: No   Current Outpatient Medications  Medication Sig Dispense Refill  . aspirin 81 MG tablet Take 81 mg by mouth daily.    Marland Kitchen CRANBERRY PO Take 1 capsule by mouth.     . FIBER PO Take  2 capsules by mouth daily. Chewable    . lisinopril-hydrochlorothiazide (PRINZIDE,ZESTORETIC) 20-25 MG tablet TAKE 1 TABLET BY MOUTH DAILY. 90 tablet 1  . ondansetron (ZOFRAN-ODT) 8 MG disintegrating tablet Take 1 tablet (8 mg total) by mouth every 8 (eight) hours as needed for nausea. 20 tablet 3  . predniSONE (DELTASONE) 50 MG tablet One tab PO daily for 5 days. 5 tablet 0  . ranitidine (ZANTAC) 150 MG tablet Take 150 mg by mouth daily.      No current facility-administered medications for this visit.    Allergies  Allergen Reactions  . Other     Pt does not do well with swallowing pills Other reaction(s): Other (See Comments) Runny nose and watery eyes.     Review of Systems: All  systems reviewed and negative except where noted in HPI.     Physical Exam:    Wt Readings from Last 3 Encounters:  07/23/18 231 lb (104.8 kg)  07/16/18 234 lb (106.1 kg)  04/23/18 234 lb (106.1 kg)    LMP 09/29/2011  Constitutional:  Pleasant, in no acute distress. Psychiatric: Normal mood and affect. Behavior is normal. EENT: Pupils normal.  Conjunctivae are normal. No scleral icterus. Neck supple. No cervical LAD. Cardiovascular: Normal rate, regular rhythm. No edema Pulmonary/chest: Effort normal and breath sounds normal. No wheezing, rales or rhonchi. Abdominal: Soft, nondistended, nontender. Bowel sounds active throughout. There are no masses palpable. No hepatomegaly. Neurological: Alert and oriented to person place and time. Skin: Skin is warm and dry. No rashes noted.   ASSESSMENT AND PLAN;   Wanda Valencia is a 52 y.o. female presenting with:  1) RUQ pain: Clinical presentation seems most consistent with biliary etiology, with noted GB stones on recent imaging study.  Liver enzymes otherwise normal and no duct dilatation or CDL.  However, given location and duration of pain, recent melenic stool, patient concerns and request from surgeon, I certainly agree that endoscopy to rule out concomitant mucosal or luminal GI pathology is pertinent and will proceed with EGD with random and directed gastric biopsies to rule out H. pylori.  2) GERD: Reflux symptoms well controlled on current therapy. - Resume H2RA -Resume antireflux lifestyle measures  3) Cholelithiasis: As already met with Dr. Rosendo Gros with plan for ccy pending endoscopy as above.  Will expedite endoscopy so do not delay surgical intervention.  The indications, risks, and benefits of EGD were explained to the patient in detail. Risks include but are not limited to bleeding, perforation, adverse reaction to medications, and cardiopulmonary compromise. Sequelae include but are not limited to the possibility of  surgery, hositalization, and mortality. The patient verbalized understanding and wished to proceed. All questions answered, referred to scheduler. Further recommendations pending results of the exam.    Lavena Bullion, DO, FACG  08/21/2018, 2:32 PM  Cc: Silverio Decamp,* Dr. Ralene Ok at [facility]

## 2018-08-21 NOTE — Patient Instructions (Addendum)
If you are age 54 or older, your body mass index should be between 23-30. Your Body mass index is 41.32 kg/m. If this is out of the aforementioned range listed, please consider follow up with your Primary Care Provider.  If you are age 54 or younger, your body mass index should be between 19-25. Your Body mass index is 41.32 kg/m. If this is out of the aformentioned range listed, please consider follow up with your Primary Care Provider.   You have been scheduled for an endoscopy. Please follow written instructions given to you at your visit today. If you use inhalers (even only as needed), please bring them with you on the day of your procedure. Your physician has requested that you go to www.startemmi.com and enter the access code given to you at your visit today. This web site gives a general overview about your procedure. However, you should still follow specific instructions given to you by our office regarding your preparation for the procedure.  It was a pleasure to see you today!  Vito Cirigliano, D.O.

## 2018-08-29 ENCOUNTER — Ambulatory Visit (AMBULATORY_SURGERY_CENTER): Payer: BLUE CROSS/BLUE SHIELD | Admitting: Gastroenterology

## 2018-08-29 ENCOUNTER — Encounter: Payer: Self-pay | Admitting: Gastroenterology

## 2018-08-29 VITALS — BP 116/70 | HR 78 | Temp 98.0°F | Resp 16 | Ht 63.0 in | Wt 233.0 lb

## 2018-08-29 DIAGNOSIS — K2970 Gastritis, unspecified, without bleeding: Secondary | ICD-10-CM | POA: Diagnosis not present

## 2018-08-29 DIAGNOSIS — K2990 Gastroduodenitis, unspecified, without bleeding: Secondary | ICD-10-CM

## 2018-08-29 DIAGNOSIS — K219 Gastro-esophageal reflux disease without esophagitis: Secondary | ICD-10-CM

## 2018-08-29 DIAGNOSIS — R1011 Right upper quadrant pain: Secondary | ICD-10-CM

## 2018-08-29 MED ORDER — SODIUM CHLORIDE 0.9 % IV SOLN: 500.0000 mL | Freq: Once | INTRAVENOUS | Status: DC

## 2018-08-29 NOTE — Patient Instructions (Signed)
Handouts given on gastritis.   YOU HAD AN ENDOSCOPIC PROCEDURE TODAY AT THE [facility]:   Refer to the procedure report that was given to you for any specific questions about what was found during the examination.  If the procedure report does not answer your questions, please call your gastroenterologist to clarify.  If you requested that your care partner not be given the details of your procedure findings, then the procedure report has been included in a sealed envelope for you to review at your convenience later.  YOU SHOULD EXPECT: Some feelings of bloating in the abdomen. Passage of more gas than usual.  Walking can help get rid of the air that was put into your GI tract during the procedure and reduce the bloating. If you had a lower endoscopy (such as a colonoscopy or flexible sigmoidoscopy) you may notice spotting of blood in your stool or on the toilet paper. If you underwent a bowel prep for your procedure, you may not have a normal bowel movement for a few days.  Please Note:  You might notice some irritation and congestion in your nose or some drainage.  This is from the oxygen used during your procedure.  There is no need for concern and it should clear up in a day or so.  SYMPTOMS TO REPORT IMMEDIATELY:    Following upper endoscopy (EGD)  Vomiting of blood or coffee ground material  New chest pain or pain under the shoulder blades  Painful or persistently difficult swallowing  New shortness of breath  Fever of 100F or higher  Black, tarry-looking stools  For urgent or emergent issues, a gastroenterologist can be reached at any hour by calling [phone].   DIET:  We do recommend a small meal at first, but then you may proceed to your regular diet.  Drink plenty of fluids but you should avoid alcoholic beverages for 24 hours.  ACTIVITY:  You should plan to take it easy for the rest of today and you should NOT DRIVE or use heavy machinery until tomorrow  (because of the sedation medicines used during the test).    FOLLOW UP: Our staff will call the number listed on your records the next business day following your procedure to check on you and address any questions or concerns that you may have regarding the information given to you following your procedure. If we do not reach you, we will leave a message.  However, if you are feeling well and you are not experiencing any problems, there is no need to return our call.  We will assume that you have returned to your regular daily activities without incident.  If any biopsies were taken you will be contacted by phone or by letter within the next 1-3 weeks.  Please call us at [phone] if you have not heard about the biopsies in 3 weeks.    SIGNATURES/CONFIDENTIALITY: You and/or your care partner have signed paperwork which will be entered into your electronic medical record.  These signatures attest to the fact that that the information above on your After Visit Summary has been reviewed and is understood.  Full responsibility of the confidentiality of this discharge information lies with you and/or your care-partner.

## 2018-08-29 NOTE — Progress Notes (Signed)
Report to RN, VSS, adequate respirations noted, no c/o pain or discomfort 

## 2018-08-29 NOTE — Progress Notes (Signed)
Called to room to assist during endoscopic procedure.  Patient ID and intended procedure confirmed with present staff. Received instructions for my participation in the procedure from the performing physician.  

## 2018-08-30 ENCOUNTER — Telehealth: Payer: Self-pay

## 2018-08-30 NOTE — Op Note (Signed)
[facility] Patient Name: Kismet Facemire Procedure Date: 08/29/2018 3:58 PM MRN: [id number] Endoscopist: Doristine Locks , MD Age: 52 Referring MD:  Date of Birth: [birth year] Gender: Female Account #: [id number] Procedure:                Upper GI endoscopy Indications:              Abdominal pain in the right upper quadrant,                            Esophageal reflux Medicines:                Monitored Anesthesia Care Procedure:                Pre-Anesthesia Assessment:                           - Prior to the procedure, a History and Physical                            was performed, and patient medications and                            allergies were reviewed. The patient's tolerance of                            previous anesthesia was also reviewed. The risks                            and benefits of the procedure and the sedation                            options and risks were discussed with the patient.                            All questions were answered, and informed consent                            was obtained. Prior Anticoagulants: The patient has                            taken aspirin, last dose was 1 day prior to                            procedure. ASA Grade Assessment: II - A patient                            with mild systemic disease. After reviewing the                            risks and benefits, the patient was deemed in                            satisfactory condition to undergo the procedure.  After obtaining informed consent, the endoscope was                            passed under direct vision. Throughout the                            procedure, the patient's blood pressure, pulse, and                            oxygen saturations were monitored continuously. The                            Model GIF-[id number]) scope was introduced                            through the mouth, and advanced to the  second part                            of duodenum. The upper GI endoscopy was                            accomplished without difficulty. The patient                            tolerated the procedure well. Scope In: Scope Out: Findings:                 The upper third of the esophagus, middle third of                            the esophagus and lower third of the esophagus were                            normal.                           The Z-line was regular and was found 38 cm from the                            incisors.                           The gastroesophageal flap valve was visualized                            endoscopically and classified as Hill Grade II                            (fold present, opens with respiration).                           Scattered mild inflammation characterized by                            erythema was found in the gastric fundus and in the  gastric body. Biopsies were taken with a cold                            forceps for Helicobacter pylori testing. Estimated                            blood loss was minimal.                           The incisura, gastric antrum and pylorus were                            normal. Biopsies were taken with a cold forceps for                            Helicobacter pylori testing.                           The duodenal bulb, first portion of the duodenum                            and second portion of the duodenum were normal. Complications:            No immediate complications. Estimated Blood Loss:     Estimated blood loss was minimal. Impression:               - Normal upper third of esophagus, middle third of                            esophagus and lower third of esophagus.                           - Z-line regular, 38 cm from the incisors.                           - Gastroesophageal flap valve classified as Hill                            Grade II (fold present, opens  with respiration).                           - Gastritis. Biopsied.                           - Normal incisura, antrum and pylorus. Biopsied.                           - Normal duodenal bulb, first portion of the                            duodenum and second portion of the duodenum. Recommendation:           - Patient has a contact number available for  emergencies. The signs and symptoms of potential                            delayed complications were discussed with the                            patient. Return to normal activities tomorrow.                            Written discharge instructions were provided to the                            patient.                           - Resume previous diet today.                           - Continue present medications.                           - Await pathology results.                           - Return to GI clinic PRN.                           - Return to referring physician at appointment to                            be scheduled. Doristine Locks, MD 08/29/2018 4:16:29 PM

## 2018-08-30 NOTE — Telephone Encounter (Signed)
  Follow up Call-  Call back number 08/29/2018  Post procedure Call Back phone  # [phone]  Permission to leave phone message Yes  Some recent data might be hidden     Patient questions:  Do you have a fever, pain , or abdominal swelling? No. Pain Score  0 *  Have you tolerated food without any problems? Yes.    Have you been able to return to your normal activities? Yes.    Do you have any questions about your discharge instructions: Diet   No. Medications  No. Follow up visit  No.  Do you have questions or concerns about your Care? No.  Actions: * If pain score is 4 or above: No action needed, pain <4.

## 2018-08-31 ENCOUNTER — Ambulatory Visit: Payer: BLUE CROSS/BLUE SHIELD | Admitting: Sports Medicine

## 2018-09-03 ENCOUNTER — Ambulatory Visit: Payer: BLUE CROSS/BLUE SHIELD | Admitting: Sports Medicine

## 2018-09-03 ENCOUNTER — Encounter: Payer: Self-pay | Admitting: Sports Medicine

## 2018-09-03 VITALS — BP 126/79 | HR 99 | Ht 63.0 in | Wt 232.0 lb

## 2018-09-03 DIAGNOSIS — M1712 Unilateral primary osteoarthritis, left knee: Secondary | ICD-10-CM

## 2018-09-03 DIAGNOSIS — Z23 Encounter for immunization: Secondary | ICD-10-CM | POA: Diagnosis not present

## 2018-09-03 NOTE — Progress Notes (Signed)
Subjective:    CC: Left knee pain  HPI: This is a pleasant 52 year old female, she has knee osteoarthritis, post arthroplasty by Dr. Luiz BlareGraves on the right, doing extremely well.  She has been having increase in her left knee pain for the past couple of months.  I injected her about 3 years ago and she did well for about 3 to 4 months but never followed up.  Pain is moderate, persistent, localized to the medial joint line without radiation or mechanical symptoms.  I reviewed the past medical history, family history, social history, surgical history, and allergies today and no changes were needed.  Please see the problem list section below in epic for further details.  Past Medical History: Past Medical History:  Diagnosis Date  . Arthritis    "right knee and thumb" (05/08/2015)  . Chronic bronchitis (HCC)    "born w/it; get it alot"  . Colon polyp 2016   1 removed   . Family history of adverse reaction to anesthesia    "my sister has reactions to about anything; anesthesia included"  . Gall stones 04/2018  . GERD (gastroesophageal reflux disease)   . Hx: UTI (urinary tract infection)   . Hypertension   . Incontinence   . Kidney stones   . Obesity   . Overactive bladder   . 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 UTI (urinary tract infection)    Past Surgical History: Past Surgical History:  Procedure Laterality Date  . CESAREAN SECTION  1989; 1991  . COLONOSCOPY  2016   [facility]ive Health Specialist with [facility] in [place]  . CYSTOSCOPY W/ STONE MANIPULATION  09/2006  . JOINT REPLACEMENT    . THUMB FUSION Right 2017  . 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   Procedure: TOTAL KNEE ARTHROPLASTY;  Surgeon: Jodi GeraldsJohn Graves, MD;  Location: [facility];  Service: Orthopedics;  Laterality: Right;  . TUBAL LIGATION  10/14/2011   Social History: Social History   Socioeconomic History  . Marital status: Married    Spouse name: Not  on file  . Number of children: 2  . Years of education: Not on file  . Highest education level: Not on file  Occupational History  . Occupation: retired  Engineer, productionocial Needs  . Financial resource strain: Not on file  . Food insecurity:    Worry: Not on file    Inability: Not on file  . Transportation needs:    Medical: Not on file    Non-medical: Not on file  Tobacco Use  . Smoking status: Former Smoker    Packs/day: 0.10    Years: 2.00    Pack years: 0.20    Types: Cigarettes    Last attempt to quit: 09/27/1983    Years since quitting: 34.9  . Smokeless tobacco: Never Used  Substance and Sexual Activity  . Alcohol use: No  . Drug use: No  . Sexual activity: Yes    Birth control/protection: Post-menopausal  Lifestyle  . Physical activity:    Days per week: Not on file    Minutes per session: Not on file  . Stress: Not on file  Relationships  . Social connections:    Talks on phone: Not on file    Gets together: Not on file    Attends religious service: Not on file    Active member of club or organization: Not on file    Attends meetings of clubs or organizations: Not  on file    Relationship status: Not on file  Other Topics Concern  . Not on file  Social History Narrative  . Not on file   Family History: Family History  Problem Relation Age of Onset  . Hypertension Mother   . Depression Mother   . Colon polyps Mother   . Atrial fibrillation Mother   . Irritable bowel syndrome Mother   . Barrett's esophagus Mother   . Hypertension Father   . Cancer Father        tongue/kidney and kidney removed   . Diabetes Father   . Breast cancer Sister        dx at the age 57  . Colon cancer Neg Hx   . Esophageal cancer Neg Hx   . Stomach cancer Neg Hx   . Rectal cancer Neg Hx    Allergies: Allergies  Allergen Reactions  . Other     Pt does not do well with swallowing pills Other reaction(s): Other (See Comments) Runny nose and watery eyes.   Medications: See med  rec.  Review of Systems: No fevers, chills, night sweats, weight loss, chest pain, or shortness of breath.   Objective:    General: Well Developed, well nourished, and in no acute distress.  Neuro: Alert and oriented x3, extra-ocular muscles intact, sensation grossly intact.  HEENT: Normocephalic, atraumatic, pupils equal round reactive to light, neck supple, no masses, no lymphadenopathy, thyroid nonpalpable.  Skin: Warm and dry, no rashes. Cardiac: Regular rate and rhythm, no murmurs rubs or gallops, no lower extremity edema.  Respiratory: Clear to auscultation bilaterally. Not using accessory muscles, speaking in full sentences. Left knee: Minimally full to palpation without overt effusion, tender to palpation of the medial joint line.Marland Kitchen ROM normal in flexion and extension and lower leg rotation. Ligaments with solid consistent endpoints including ACL, PCL, LCL, MCL. Negative Mcmurray's and provocative meniscal tests. Non painful patellar compression. Patellar and quadriceps tendons unremarkable. Hamstring and quadriceps strength is normal.  Procedure: Real-time Ultrasound Guided Injection of left knee Device: GE Logiq E  Verbal informed consent obtained.  Time-out conducted.  Noted no overlying erythema, induration, or other signs of local infection.  Skin prepped in a sterile fashion.  Local anesthesia: Topical Ethyl chloride.  With sterile technique and under real time ultrasound guidance: 1 cc Kenalog 40, 2 cc lidocaine, 2 cc bupivacaine injected easily Completed without difficulty  Pain immediately resolved suggesting accurate placement of the medication.  Advised to call if fevers/chills, erythema, induration, drainage, or persistent bleeding.  Images permanently stored and available for review in the ultrasound unit.  Impression: Technically successful ultrasound guided injection.  Impression and Recommendations:    Primary osteoarthritis of left knee Injection as  above, return as needed. ___________________________________________ Ihor Austin. Benjamin Stain, M.D., ABFM., CAQSM. Primary Care and Sports Medicine  [facility]  Adjunct Professor of Family Medicine  [employer]

## 2018-09-03 NOTE — Assessment & Plan Note (Signed)
Injection as above, return as needed 

## 2018-09-04 ENCOUNTER — Encounter: Payer: Self-pay | Admitting: Gastroenterology

## 2018-11-30 ENCOUNTER — Other Ambulatory Visit: Payer: Self-pay | Admitting: General Surgery

## 2018-12-28 ENCOUNTER — Encounter (INDEPENDENT_AMBULATORY_CARE_PROVIDER_SITE_OTHER): Payer: BLUE CROSS/BLUE SHIELD | Admitting: Sports Medicine

## 2018-12-28 DIAGNOSIS — K760 Fatty (change of) liver, not elsewhere classified: Secondary | ICD-10-CM

## 2019-01-04 ENCOUNTER — Other Ambulatory Visit: Payer: Self-pay | Admitting: Sports Medicine

## 2019-01-22 ENCOUNTER — Other Ambulatory Visit: Payer: Self-pay | Admitting: Sports Medicine

## 2019-01-22 DIAGNOSIS — J310 Chronic rhinitis: Secondary | ICD-10-CM

## 2019-01-28 ENCOUNTER — Ambulatory Visit: Payer: BLUE CROSS/BLUE SHIELD | Admitting: Sports Medicine

## 2019-01-28 ENCOUNTER — Encounter: Payer: Self-pay | Admitting: Sports Medicine

## 2019-01-28 DIAGNOSIS — M1712 Unilateral primary osteoarthritis, left knee: Secondary | ICD-10-CM

## 2019-01-28 NOTE — Assessment & Plan Note (Signed)
Most recent injection was in December 2019, injections are only lasting about 6 weeks, she does need to schedule with Dr. Luiz Blare for arthroplasty. Right knee doing extremely well post arthroplasty.

## 2019-01-28 NOTE — Progress Notes (Signed)
Subjective:    CC: Left knee pain  HPI: This is a pleasant 53 year old female, she did really well with a right knee arthroplasty, she has been having symptoms on the left, last injection was in December 2019.  Injections her last recurrence of pain, moderate, persistent, localized at the joint lines on the left knee without radiation.  No trauma, no mechanical symptoms, she understands she probably needs to get in touch with Dr. Luiz Blare to do her left knee arthroplasty.  I reviewed the past medical history, family history, social history, surgical history, and allergies today and no changes were needed.  Please see the problem list section below in epic for further details.  Past Medical History: Past Medical History:  Diagnosis Date  . Arthritis    "right knee and thumb" (05/08/2015)  . Chronic bronchitis (HCC)    "born w/it; get it alot"  . Colon polyp 2016   1 removed   . Family history of adverse reaction to anesthesia    "my sister has reactions to about anything; anesthesia included"  . Gall stones 04/2018  . GERD (gastroesophageal reflux disease)   . Hx: UTI (urinary tract infection)   . Hypertension   . Incontinence   . Kidney stones   . Obesity   . Overactive bladder   . 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 UTI (urinary tract infection)    Past Surgical History: Past Surgical History:  Procedure Laterality Date  . CESAREAN SECTION  1989; 1991  . COLONOSCOPY  2016   Digestive Health Specialist with [facility] in [place]  . CYSTOSCOPY W/ STONE MANIPULATION  09/2006  . JOINT REPLACEMENT    . THUMB FUSION Right 2017  . 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   Procedure: TOTAL KNEE ARTHROPLASTY;  Surgeon: Jodi Geralds, MD;  Location: [facility] OR;  Service: Orthopedics;  Laterality: Right;  . TUBAL LIGATION  10/14/2011   Social History: Social History   Socioeconomic History  . Marital status: Married    Spouse  name: Not on file  . Number of children: 2  . Years of education: Not on file  . Highest education level: Not on file  Occupational History  . Occupation: retired  Engineer, production  . Financial resource strain: Not on file  . Food insecurity:    Worry: Not on file    Inability: Not on file  . Transportation needs:    Medical: Not on file    Non-medical: Not on file  Tobacco Use  . Smoking status: Former Smoker    Packs/day: 0.10    Years: 2.00    Pack years: 0.20    Types: Cigarettes    Last attempt to quit: 09/27/1983    Years since quitting: 35.3  . Smokeless tobacco: Never Used  Substance and Sexual Activity  . Alcohol use: No  . Drug use: No  . Sexual activity: Yes    Birth control/protection: Post-menopausal  Lifestyle  . Physical activity:    Days per week: Not on file    Minutes per session: Not on file  . Stress: Not on file  Relationships  . Social connections:    Talks on phone: Not on file    Gets together: Not on file    Attends religious service: Not on file    Active member of club or organization: Not on file    Attends meetings of clubs or organizations: Not on file  Relationship status: Not on file  Other Topics Concern  . Not on file  Social History Narrative  . Not on file   Family History: Family History  Problem Relation Age of Onset  . Hypertension Mother   . Depression Mother   . Colon polyps Mother   . Atrial fibrillation Mother   . Irritable bowel syndrome Mother   . Barrett's esophagus Mother   . Hypertension Father   . Cancer Father        tongue/kidney and kidney removed   . Diabetes Father   . Breast cancer Sister        dx at the age 55  . Colon cancer Neg Hx   . Esophageal cancer Neg Hx   . Stomach cancer Neg Hx   . Rectal cancer Neg Hx    Allergies: Allergies  Allergen Reactions  . Other     Pt does not do well with swallowing pills Other reaction(s): Other (See Comments) Runny nose and watery eyes.   Medications:  See med rec.  Review of Systems: No fevers, chills, night sweats, weight loss, chest pain, or shortness of breath.   Objective:    General: Well Developed, well nourished, and in no acute distress.  Neuro: Alert and oriented x3, extra-ocular muscles intact, sensation grossly intact.  HEENT: Normocephalic, atraumatic, pupils equal round reactive to light, neck supple, no masses, no lymphadenopathy, thyroid nonpalpable.  Skin: Warm and dry, no rashes. Cardiac: Regular rate and rhythm, no murmurs rubs or gallops, no lower extremity edema.  Respiratory: Clear to auscultation bilaterally. Not using accessory muscles, speaking in full sentences. Left knee:  Mild effusion without a fluid wave. Minimal swelling, tender to palpation of the medial joint line. ROM normal in flexion and extension and lower leg rotation. Ligaments with solid consistent endpoints including ACL, PCL, LCL, MCL. Negative Mcmurray's and provocative meniscal tests. Non painful patellar compression. Patellar and quadriceps tendons unremarkable. Hamstring and quadriceps strength is normal.  Procedure: Real-time Ultrasound Guided injection of the left knee Device: GE Logiq E  Verbal informed consent obtained.  Time-out conducted.  Noted no overlying erythema, induration, or other signs of local infection.  Skin prepped in a sterile fashion.  Local anesthesia: Topical Ethyl chloride.  With sterile technique and under real time ultrasound guidance:  1 cc Kenalog 40, 2 cc lidocaine, 2 cc bupivacaine injected easily Completed without difficulty  Pain immediately resolved suggesting accurate placement of the medication.  Advised to call if fevers/chills, erythema, induration, drainage, or persistent bleeding.  Images permanently stored and available for review in the ultrasound unit.  Impression: Technically successful ultrasound guided injection.  Impression and Recommendations:    Primary osteoarthritis of left knee  Most recent injection was in December 2019, injections are only lasting about 6 weeks, she does need to schedule with Dr. Luiz BlareGraves for arthroplasty. Right knee doing extremely well post arthroplasty.   ___________________________________________ Ihor Austinhomas J. Benjamin Stainhekkekandam, M.D., ABFM., CAQSM. Primary Care and Sports Medicine [facility]  Adjunct Professor of Family Medicine  [employer]

## 2019-03-25 ENCOUNTER — Ambulatory Visit: Payer: BLUE CROSS/BLUE SHIELD | Admitting: Sports Medicine

## 2019-03-25 DIAGNOSIS — R238 Other skin changes: Secondary | ICD-10-CM | POA: Diagnosis not present

## 2019-03-25 NOTE — Progress Notes (Signed)
Subjective:    CC: Lump on foot  HPI: This is a very pleasant 53 year old female, she has noticed a lump on the back of her left heel, it is nonpainful, her husband tried to stick a needle in it to drain it.  I reviewed the past medical history, family history, social history, surgical history, and allergies today and no changes were needed.  Please see the problem list section below in epic for further details.  Past Medical History: Past Medical History:  Diagnosis Date  . Arthritis    "right knee and thumb" (05/08/2015)  . Chronic bronchitis (Hand)    "born w/it; get it alot"  . Colon polyp 2016   1 removed   . Family history of adverse reaction to anesthesia    "my sister has reactions to about anything; anesthesia included"  . Gall stones 04/2018  . GERD (gastroesophageal reflux disease)   . Hx: UTI (urinary tract infection)   . Hypertension   . Incontinence   . Kidney stones   . Obesity   . Overactive bladder   . 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 UTI (urinary tract infection)    Past Surgical History: Past Surgical History:  Procedure Laterality Date  . [place]; 1991  . COLONOSCOPY  2016   Digestive Health Specialist with [facility] in [place]  . CYSTOSCOPY W/ STONE MANIPULATION  09/2006  . JOINT REPLACEMENT    . THUMB FUSION Right 2017  . 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   Procedure: TOTAL KNEE ARTHROPLASTY;  Surgeon: Dorna Leitz, MD;  Location: [place];  Service: Orthopedics;  Laterality: Right;  . TUBAL LIGATION  10/14/2011   Social History: Social History   Socioeconomic History  . Marital status: Married    Spouse name: Not on file  . Number of children: 2  . Years of education: Not on file  . Highest education level: Not on file  Occupational History  . Occupation: retired  Scientific laboratory technician  . Financial resource strain: Not on file  . Food insecurity    Worry: Not on  file    Inability: Not on file  . Transportation needs    Medical: Not on file    Non-medical: Not on file  Tobacco Use  . Smoking status: Former Smoker    Packs/day: 0.10    Years: 2.00    Pack years: 0.20    Types: Cigarettes    Quit date: 09/27/1983    Years since quitting: 35.5  . Smokeless tobacco: Never Used  Substance and Sexual Activity  . Alcohol use: No  . Drug use: No  . Sexual activity: Yes    Birth control/protection: Post-menopausal  Lifestyle  . Physical activity    Days per week: Not on file    Minutes per session: Not on file  . Stress: Not on file  Relationships  . Social Herbalist on phone: Not on file    Gets together: Not on file    Attends religious service: Not on file    Active member of club or organization: Not on file    Attends meetings of clubs or organizations: Not on file    Relationship status: Not on file  Other Topics Concern  . Not on file  Social History Narrative  . Not on file   Family History: Family History  Problem Relation Age of Onset  . Hypertension Mother   .  Depression Mother   . Colon polyps Mother   . Atrial fibrillation Mother   . Irritable bowel syndrome Mother   . Barrett's esophagus Mother   . Hypertension Father   . Cancer Father        tongue/kidney and kidney removed   . Diabetes Father   . Breast cancer Sister        dx at the age 55  . Colon cancer Neg Hx   . Esophageal cancer Neg Hx   . Stomach cancer Neg Hx   . Rectal cancer Neg Hx    Allergies: Allergies  Allergen Reactions  . Other     Pt does not do well with swallowing pills Other reaction(s): Other (See Comments) Runny nose and watery eyes.   Medications: See med rec.  Review of Systems: No fevers, chills, night sweats, weight loss, chest pain, or shortness of breath.   Objective:    General: Well Developed, well nourished, and in no acute distress.  Neuro: Alert and oriented x3, extra-ocular muscles intact, sensation  grossly intact.  HEENT: Normocephalic, atraumatic, pupils equal round reactive to light, neck supple, no masses, no lymphadenopathy, thyroid nonpalpable.  Skin: Warm and dry, no rashes. Cardiac: Regular rate and rhythm, no murmurs rubs or gallops, no lower extremity edema.  Respiratory: Clear to auscultation bilaterally. Not using accessory muscles, speaking in full sentences. Left foot: Piezogenic pedal papule noticed on the posterior heel.  Impression and Recommendations:    Piezogenic pedal papule, left Piezogenic pedal papules are benign herniations of fat tissue from the subcutaneous tissue into the skin. This one is asymptomatic, no evaluation or further intervention needed. If they do become symptomatic then oral NSAIDs can be used, if these fail then a small injection of steroid into the papule can cause enough fatty atrophy to reduce symptoms to nothing.   ___________________________________________ Ihor Austinhomas J. Benjamin Stainhekkekandam, M.D., ABFM., CAQSM. Primary Care and Sports Medicine [facility]  Adjunct Professor of Family Medicine  [employer]

## 2019-03-25 NOTE — Assessment & Plan Note (Addendum)
Piezogenic pedal papules are benign herniations of fat tissue from the subcutaneous tissue into the skin. This one is asymptomatic, no evaluation or further intervention needed. If they do become symptomatic then oral NSAIDs can be used, if these fail then a small injection of steroid into the papule can cause enough fatty atrophy to reduce symptoms to nothing.

## 2019-04-13 ENCOUNTER — Emergency Department
Admission: EM | Admit: 2019-04-13 | Discharge: 2019-04-13 | Disposition: A | Discharge status: Home / Self Care | Payer: BC Managed Care – PPO | Source: Home / Self Care

## 2019-04-13 ENCOUNTER — Other Ambulatory Visit: Payer: Self-pay

## 2019-04-13 DIAGNOSIS — M25531 Pain in right wrist: Secondary | ICD-10-CM | POA: Diagnosis not present

## 2019-04-13 MED ORDER — DICLOFENAC SODIUM 75 MG PO TBEC: 75.0000 mg | DELAYED_RELEASE_TABLET | Freq: Two times a day (BID) | ORAL | 0 refills | Status: DC

## 2019-04-13 NOTE — ED Provider Notes (Signed)
[facility]    CSN: [id number] Arrival date & time: 04/13/19  [id number]     History   Chief Complaint No chief complaint on file.   HPI Wanda Valencia is a 53 y.o. female.   HPI Pleasant lady who is here complaining of right wrist pain.  She did have a fusion of her MP joint a few years ago.  She bumped her hand a couple of weeks ago on the computer table and felt a little pop.  Did not really have problems though for a week or so and then started having just some generalized hurting in her wrist and a little bit up the forearm.  No specific point tenderness.  No major swelling or inflammation.  She did use some ice on it last night.  She took ibuprofen yesterday for it.  She has not taken regular NSAIDs.  She is retired and cares for her grandchildren.  She has 3 children.  Lives in the [place] area. Past Medical History:  Diagnosis Date  . Arthritis    "right knee and thumb" (05/08/2015)  . Chronic bronchitis (HCC)    "born w/it; get it alot"  . Colon polyp 2016   1 removed   . Family history of adverse reaction to anesthesia    "my sister has reactions to about anything; anesthesia included"  . Gall stones 04/2018  . GERD (gastroesophageal reflux disease)   . Hx: UTI (urinary tract infection)   . Hypertension   . Incontinence   . Kidney stones   . Obesity   . Overactive bladder   . 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 UTI (urinary tract infection)     Patient Active Problem List   Diagnosis Date Noted  . Piezogenic pedal papule, 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  . Primary osteoarthritis of left knee 09/03/2018  . Radiculitis of left cervical region 07/16/2018  . Acute frontal sinusitis 06/29/2018  . Urine leukocytes 04/06/2018  . History of acute pyelonephritis 04/06/2018  . Pes anserine bursitis 01/18/2018  . Rhinitis 11/28/2016  . Skin tag 07/29/2016  . Cystitis 04/15/2016  . History of arthroplasty of right knee 05/08/2015  . Plantar fasciitis,  left 08/25/2014  . Fatty liver disease, nonalcoholic 06/17/2014  . Biliary colic 06/17/2014  . History of kidney stones 05/20/2014  . Hyperlipidemia LDL goal < 100 11/29/2012  . Annual physical exam 07/17/2012  . 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 Obesity 07/17/2012  . Essential hypertension, benign 07/17/2012  . Right first Metacarpophalangeal joint pain 04/30/2012    Past Surgical History:  Procedure Laterality Date  . CESAREAN SECTION  1989; 1991  . COLONOSCOPY  2016   [facility] with [facility] in [place]  . CYSTOSCOPY W/ STONE MANIPULATION  09/2006  . JOINT REPLACEMENT    . THUMB FUSION Right 2017  . 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   Procedure: TOTAL KNEE ARTHROPLASTY;  Surgeon: Jodi GeraldsJohn Graves, MD;  Location: [facility] OR;  Service: Orthopedics;  Laterality: Right;  . TUBAL LIGATION  10/14/2011    OB History    Gravida  2   Para  2   Term  2   Preterm      AB      Living  2     SAB      TAB      Ectopic      Multiple      Live Births  Home Medications    Prior to Admission medications   Medication Sig Start Date End Date Taking? Authorizing Provider  aspirin 81 MG tablet Take 81 mg by mouth daily.    [provider]  cetirizine (ZYRTEC) 10 MG tablet Take 10 mg by mouth once.    [provider]  CRANBERRY PO Take 1 capsule by mouth.     [provider]  diclofenac (VOLTAREN) 75 MG EC tablet Take 1 tablet (75 mg total) by mouth 2 (two) times daily. 04/13/19   Posey Boyer, MD  FIBER PO Take 2 capsules by mouth daily. Chewable    [provider]  fluticasone (FLONASE) 50 MCG/ACT nasal spray PLEASE SEE ATTACHED FOR DETAILED DIRECTIONS 01/22/19   Silverio Decamp, MD  Lansoprazole (PREVACID PO) Take 1 tablet by mouth daily.    [provider]  lisinopril-hydrochlorothiazide (PRINZIDE,ZESTORETIC) 20-25 MG tablet TAKE 1  TABLET BY MOUTH DAILY. 01/04/19   Silverio Decamp, MD    Family History Family History  Problem Relation Age of Onset  . Hypertension Mother   . Depression Mother   . Colon polyps Mother   . Atrial fibrillation Mother   . Irritable bowel syndrome Mother   . Barrett's esophagus Mother   . Hypertension Father   . Cancer Father        tongue/kidney and kidney removed   . Diabetes Father   . Breast cancer Sister        dx at the age 48  . Colon cancer Neg Hx   . Esophageal cancer Neg Hx   . Stomach cancer Neg Hx   . Rectal cancer Neg Hx     Social History Social History   Tobacco Use  . Smoking status: Former Smoker    Packs/day: 0.10    Years: 2.00    Pack years: 0.20    Types: Cigarettes    Quit date: 09/27/1983    Years since quitting: 35.5  . Smokeless tobacco: Never Used  Substance Use Topics  . Alcohol use: No  . Drug use: No     Allergies   Other   Review of Systems Review of Systems No other major acute complaints.  She is generally healthy.  Physical Exam Triage Vital Signs ED Triage Vitals  Enc Vitals Group     BP      Pulse      Resp      Temp      Temp src      SpO2      Weight      Height      Head Circumference      Peak Flow      Pain Score      Pain Loc      Pain Edu?      Excl. in [place]?    No data found.  Updated Vital Signs LMP 09/29/2011   Visual Acuity Right Eye Distance:   Left Eye Distance:   Bilateral Distance:    Right Eye Near:   Left Eye Near:    Bilateral Near:     Physical Exam  No major distress.  Neurovascular intact.  No gross abnormalities.  The metatarsal phalangeal joint of that thumb is fused.  It is nontender.  The wrist and forearm have only minimal tenderness.  No point tenderness.  No swelling.  Good range of motion. UC Treatments / Results  Labs (all labs ordered are listed, but only abnormal results are  displayed) Labs Reviewed - No data to display  EKG   Radiology No results found.   Procedures Procedures (including critical care time)  Medications Ordered in UC Medications - No data to display  Initial Impression / Assessment and Plan / UC Course  I have reviewed the triage vital signs and the nursing notes.  Pertinent labs & imaging results that were available during my care of the patient were reviewed by me and considered in my medical decision making (see chart for details).     Nonspecific wrist pain.  Do not see any indication for x-rays at this time.  If she keeps having problems she should see her primary care doctor who is a sports medicine doctor or see Dr. Amanda PeaGramig who operated on her thumb joint a few years ago. Final Clinical Impressions(s) / UC Diagnoses   Final diagnoses:  Wrist pain, acute, right     Discharge Instructions     Wear one of your old wrist splints  Take diclofenac 75 mg 1 twice daily for pain and inflammation  You can take additional Tylenol if needed for pain, but avoid ibuprofen or Aleve  Apply ice for 10 to 15 minutes several times daily for relief  If symptoms continue to persist, either return to see Dr. Karie Schwalbe or Dr. Amanda PeaGramig.    ED Prescriptions    Medication Sig Dispense Auth. Provider   diclofenac (VOLTAREN) 75 MG EC tablet Take 1 tablet (75 mg total) by mouth 2 (two) times daily. 20 tablet Peyton NajjarHopper,  H, MD     Controlled Substance Prescriptions [place] Controlled Substance Registry consulted? No   Peyton NajjarHopper,  H, MD 04/13/19 [phone]

## 2019-04-13 NOTE — ED Triage Notes (Signed)
Pt c/o RT wrist pain for several days. Describes as tingling and dull ache that sometimes radiates up forearm. Taking ibuprofen prn and icing as needed.

## 2019-04-13 NOTE — Discharge Instructions (Signed)
Wear one of your old wrist splints  Take diclofenac 75 mg 1 twice daily for pain and inflammation  You can take additional Tylenol if needed for pain, but avoid ibuprofen or Aleve  Apply ice for 10 to 15 minutes several times daily for relief  If symptoms continue to persist, either return to see Dr. Darene Lamer or Dr. Amedeo Plenty.

## 2019-04-17 IMAGING — CT CT RENAL STONE PROTOCOL
2 of 4 series · 15 of 46 positions shown, 17 images · non-contrast
Comparison: None.

CLINICAL DATA: Left flank pain for 2 weeks.

EXAM:
CT ABDOMEN AND PELVIS WITHOUT CONTRAST
TECHNIQUE: Multidetector CT imaging of the abdomen and pelvis was performed
following the standard protocol without IV contrast.

[Series 2: axial st · axial · 0.88mm/px · z∈[+332,+782]mm · 12 of 100 slices shown, 14 images]
[im 5/100  soft-tissue]
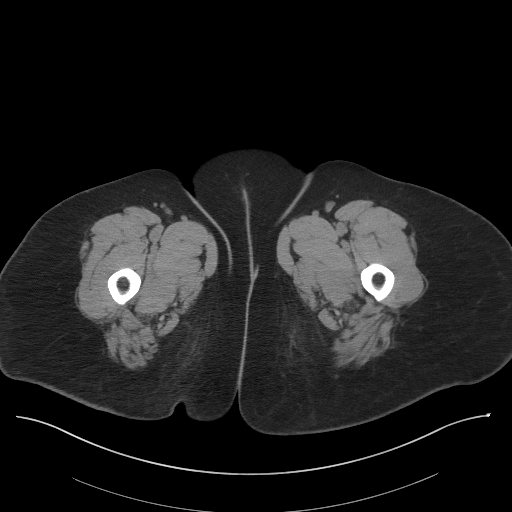
[im 5/100  bone]
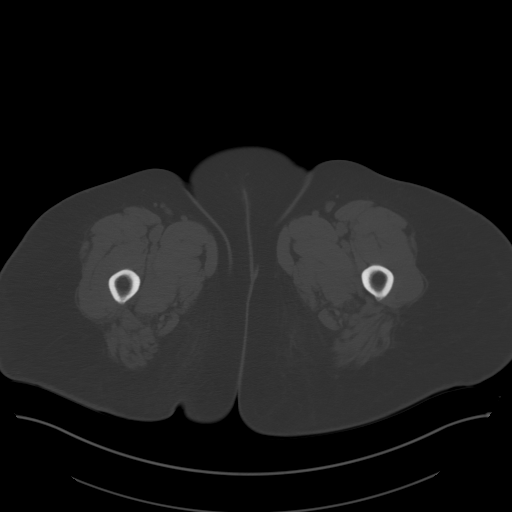
[im 13/100  soft-tissue]
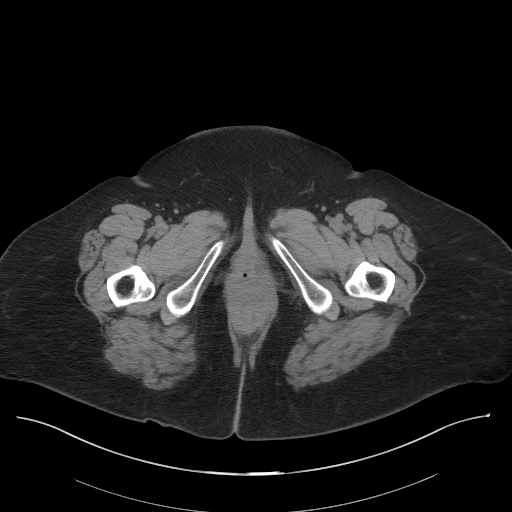
[im 21/100  soft-tissue]
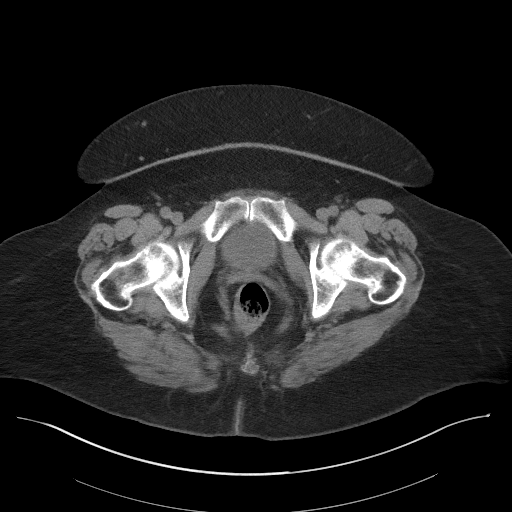
[im 29/100  soft-tissue]
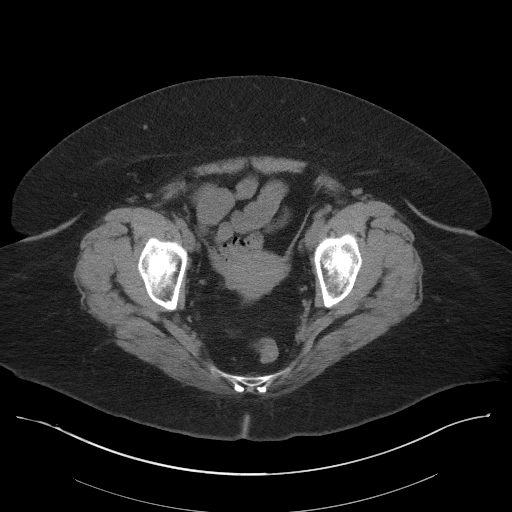
[im 38/100  soft-tissue]
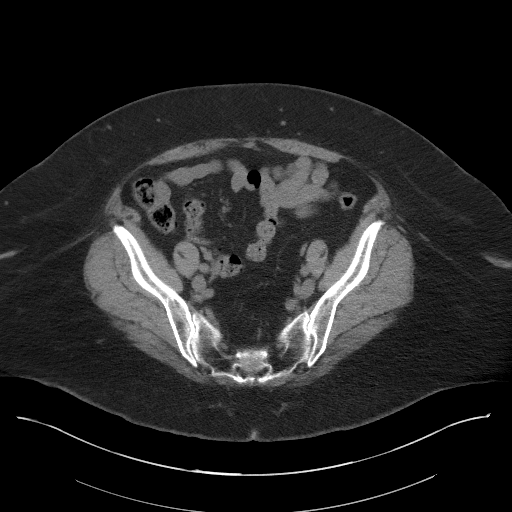
[im 46/100  soft-tissue]
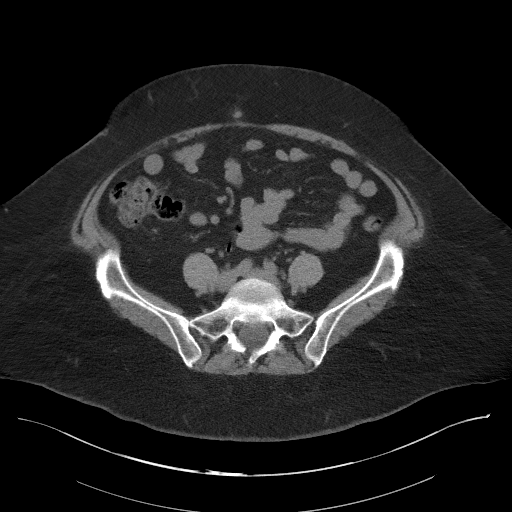
[im 54/100  soft-tissue]
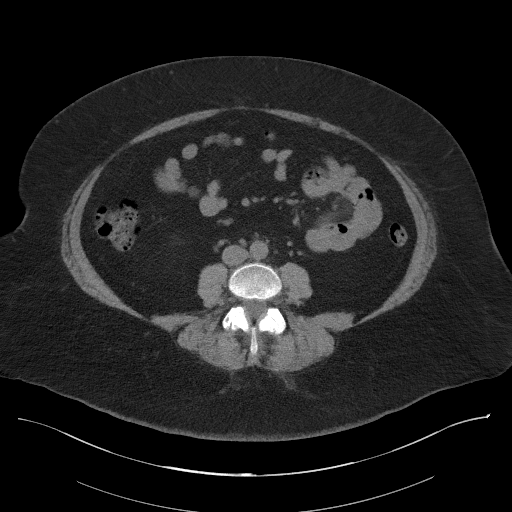
[im 62/100  soft-tissue]
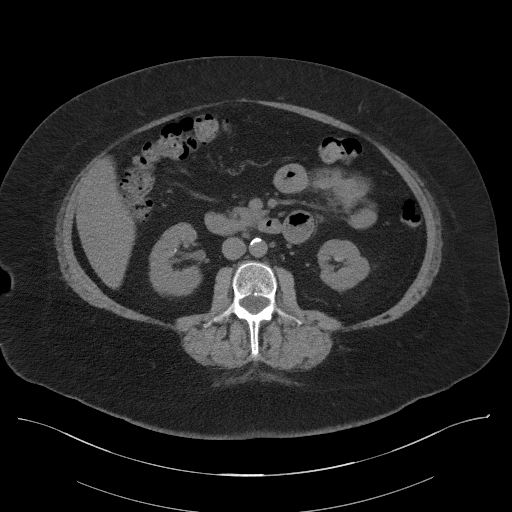
[im 71/100  soft-tissue]
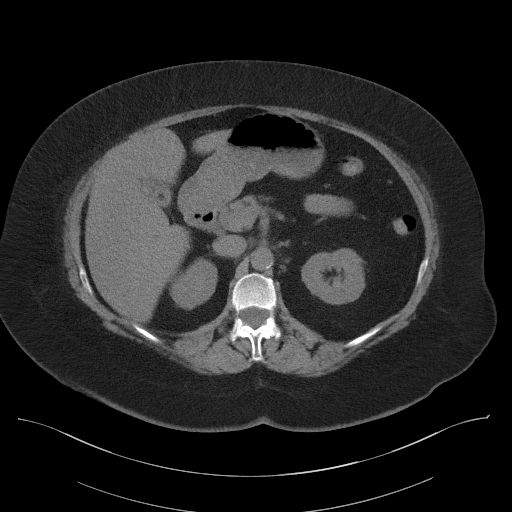
[im 71/100  bone]
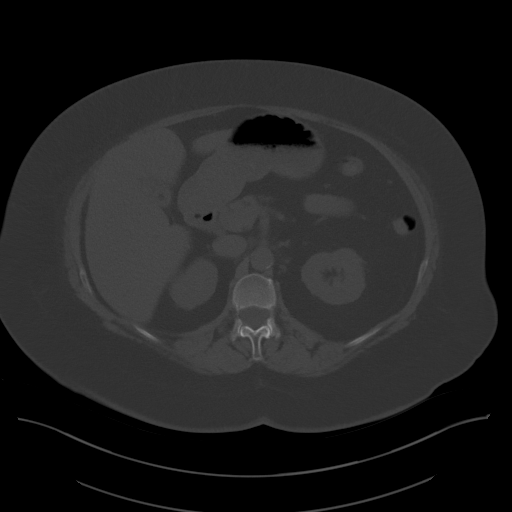
[im 79/100  soft-tissue]
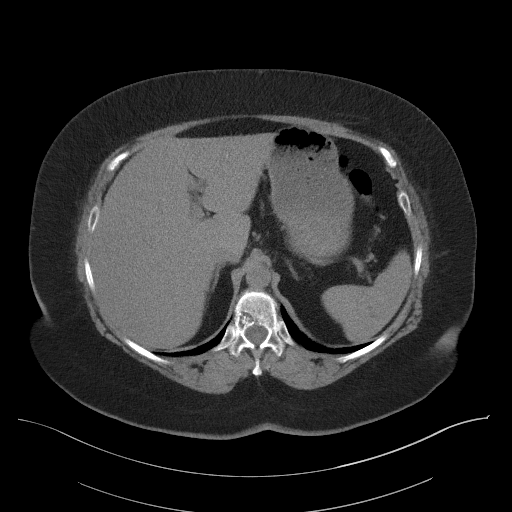
[im 87/100  soft-tissue]
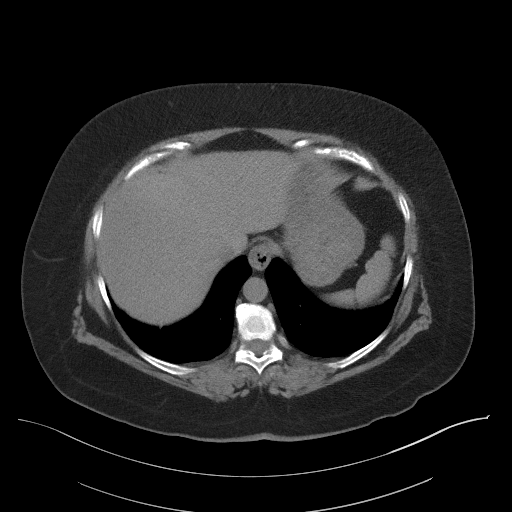
[im 95/100  soft-tissue]
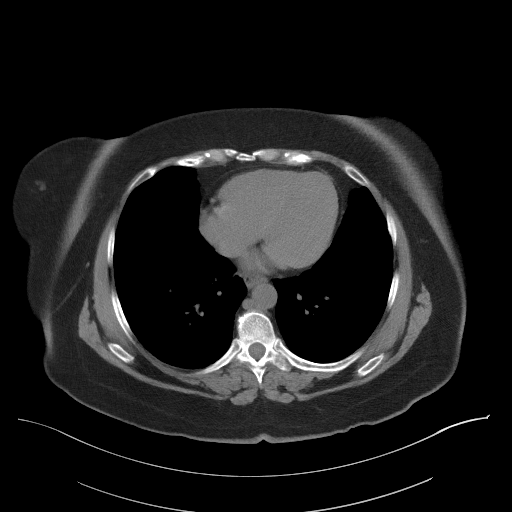

[Series 5: coronal st · coronal · 0.70mm/px · 3 of 103 slices shown]
[im 35/103  soft-tissue]
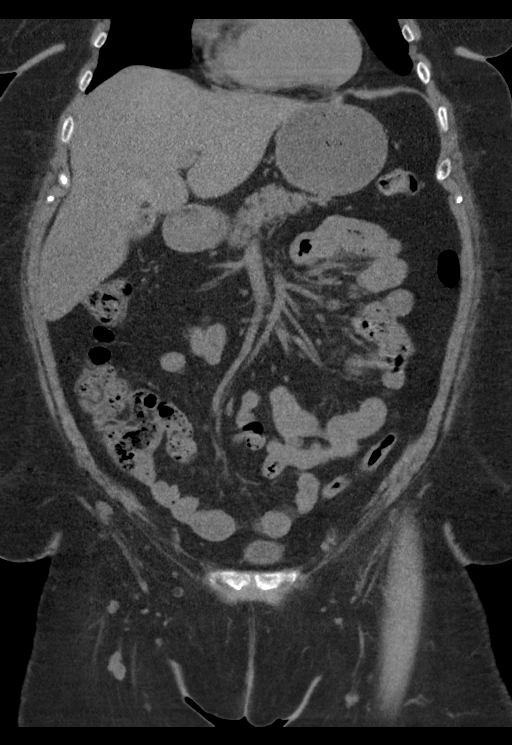
[im 46/103  soft-tissue]
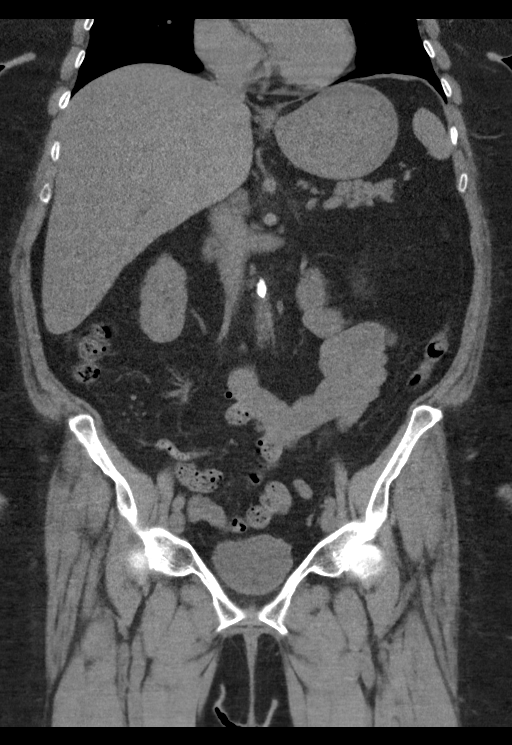
[im 57/103  soft-tissue]
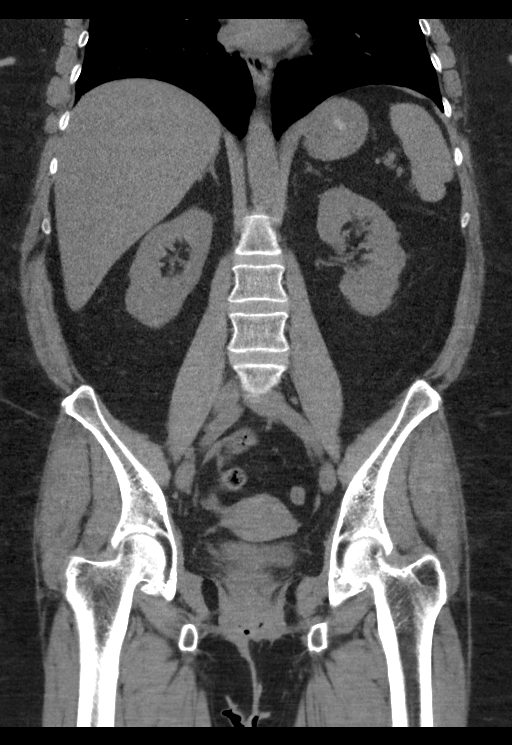

[15 of 46 positions shown; findings below may reference images not displayed]

FINDINGS: Lower chest: Scattered hypoventilatory and subsegmental atelectasis
in the lung bases. No pleural fluid or consolidation.

Hepatobiliary: The liver is enlarged spanning 20 cm cranial caudal
with diffusely decreased density consistent with steatosis. No
discrete hepatic lesion on noncontrast exam. Gallstones within
partially distended gallbladder without pericholecystic
inflammation. No biliary dilatation.

Pancreas: No ductal dilatation or inflammation.

Spleen: Normal in size. Few vague low densities at the inferior
hilum are incompletely characterized, but likely hemangiomas or
cysts.

Adrenals/Urinary Tract: No adrenal nodule. No hydronephrosis.
Minimal symmetric bilateral perinephric stranding. Bilateral
nonobstructing stones in both kidneys, least 2 stones on the left
and 3 stones on the right. Both ureters are decompressed. Urinary
bladder is partially distended, no bladder stone or wall thickening.

Stomach/Bowel: Small hiatal hernia. Stomach distended with ingested
contents. No bowel wall thickening, inflammatory change or
obstruction. Normal appendix. Small colonic stool burden. No
significant diverticular disease.

Vascular/Lymphatic: Mild aortic atherosclerosis without aneurysm. No
enlarged abdominal or pelvic lymph nodes.

Reproductive: Uterus and bilateral adnexa are unremarkable.

Other: No free air, free fluid, or intra-abdominal fluid collection.

Musculoskeletal: There are no acute or suspicious osseous
abnormalities. Scattered facet arthropathy in the lumbar spine.
Small vertebral body hemangioma within T11, incidental.
IMPRESSION: 1. Bilateral nonobstructing nephrolithiasis. No hydronephrosis or
obstructive uropathy.
2. Mild hepatomegaly with hepatic steatosis. Cholelithiasis without
gallbladder inflammation.
3. Tiny hiatal hernia.
4.  Aortic Atherosclerosis ([id number]-ISL.L).

## 2019-05-24 ENCOUNTER — Ambulatory Visit: Payer: BC Managed Care – PPO | Admitting: Sports Medicine

## 2019-05-24 ENCOUNTER — Encounter: Payer: Self-pay | Admitting: Sports Medicine

## 2019-05-24 ENCOUNTER — Other Ambulatory Visit: Payer: Self-pay

## 2019-05-24 VITALS — BP 112/71 | HR 96 | Ht 63.0 in | Wt 230.0 lb

## 2019-05-24 DIAGNOSIS — Z23 Encounter for immunization: Secondary | ICD-10-CM | POA: Diagnosis not present

## 2019-05-24 DIAGNOSIS — Z01818 Encounter for other preprocedural examination: Secondary | ICD-10-CM | POA: Diagnosis not present

## 2019-05-24 NOTE — Assessment & Plan Note (Signed)
Surgical clearance visit for left total knee arthroplasty. She has had no problems with anesthesia in the past, no problems with her prior knee replacement, greater than 4 metabolic equivalents of cardiac capacity, EKG unchanged from 2017, ordering routine labs. Return as needed.

## 2019-05-24 NOTE — Progress Notes (Signed)
Subjective:    CC: Preop exam  HPI: This is a pleasant 53 year old female, she has her left knee arthroplasty scheduled with Dr. Luiz BlareGraves, here for preop exam, greater than 4 metabolic equivalents of cardiac capacity, no problems with anesthesia in the past, ECG today, she needs labs.  No bleeding diatheses.  I reviewed the past medical history, family history, social history, surgical history, and allergies today and no changes were needed.  Please see the problem list section below in epic for further details.  Past Medical History: Past Medical History:  Diagnosis Date  . Arthritis    "right knee and thumb" (05/08/2015)  . Chronic bronchitis (HCC)    "born w/it; get it alot"  . Colon polyp 2016   1 removed   . Family history of adverse reaction to anesthesia    "my sister has reactions to about anything; anesthesia included"  . Gall stones 04/2018  . GERD (gastroesophageal reflux disease)   . Hx: UTI (urinary tract infection)   . Hypertension   . Incontinence   . Kidney stones   . Obesity   . Overactive bladder   . 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 UTI (urinary tract infection)    Past Surgical History: Past Surgical History:  Procedure Laterality Date  . CESAREAN SECTION  1989; 1991  . COLONOSCOPY  2016   [facility] [place]  . CYSTOSCOPY W/ STONE MANIPULATION  09/2006  . JOINT REPLACEMENT    . THUMB FUSION Right 2017  . 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   Procedure: TOTAL KNEE ARTHROPLASTY;  Surgeon: Jodi GeraldsJohn Graves, MD;  Location: [facility];  Service: Orthopedics;  Laterality: Right;  . TUBAL LIGATION  10/14/2011   Social History: Social History   Socioeconomic History  . Marital status: Married    Spouse name: Not on file  . Number of children: 2  . Years of education: Not on file  . Highest education level: Not on file  Occupational History  . Occupation:  retired  Engineer, productionocial Needs  . Financial resource strain: Not on file  . Food insecurity    Worry: Not on file    Inability: Not on file  . Transportation needs    Medical: Not on file    Non-medical: Not on file  Tobacco Use  . Smoking status: Former Smoker    Packs/day: 0.10    Years: 2.00    Pack years: 0.20    Types: Cigarettes    Quit date: 09/27/1983    Years since quitting: 35.6  . Smokeless tobacco: Never Used  Substance and Sexual Activity  . Alcohol use: No  . Drug use: No  . Sexual activity: Yes    Birth control/protection: Post-menopausal  Lifestyle  . Physical activity    Days per week: Not on file    Minutes per session: Not on file  . Stress: Not on file  Relationships  . Social Musicianconnections    Talks on phone: Not on file    Gets together: Not on file    Attends religious service: Not on file    Active member of club or organization: Not on file    Attends meetings of clubs or organizations: Not on file    Relationship status: Not on file  Other Topics Concern  . Not on file  Social History Narrative  . Not on file   Family History: Family History  Problem Relation Age of Onset  . Hypertension Mother   . Depression Mother   . Colon polyps Mother   . Atrial fibrillation Mother   . Irritable bowel syndrome Mother   . Barrett's esophagus Mother   . Hypertension Father   . Cancer Father        tongue/kidney and kidney removed   . Diabetes Father   . Breast cancer Sister        dx at the age 60  . Colon cancer Neg Hx   . Esophageal cancer Neg Hx   . Stomach cancer Neg Hx   . Rectal cancer Neg Hx    Allergies: Allergies  Allergen Reactions  . Other     Pt does not do well with swallowing pills Other reaction(s): Other (See Comments) Runny nose and watery eyes.   Medications: See med rec.  Review of Systems: No fevers, chills, night sweats, weight loss, chest pain, or shortness of breath.   Objective:    General: Well Developed, well  nourished, and in no acute distress.  Neuro: Alert and oriented x3, extra-ocular muscles intact, sensation grossly intact.  HEENT: Normocephalic, atraumatic, pupils equal round reactive to light, neck supple, no masses, no lymphadenopathy, thyroid nonpalpable.  Skin: Warm and dry, no rashes. Cardiac: Regular rate and rhythm, no murmurs rubs or gallops, no lower extremity edema.  Respiratory: Clear to auscultation bilaterally. Not using accessory muscles, speaking in full sentences.  Twelve-lead ECG personally reviewed, she is normal sinus rhythm with a rate of 91 bpm, there is some left axis deviation unchanged from 2017, no inverted T waves, overall unchanged.  Impression and Recommendations:    Preop examination Surgical clearance visit for left total knee arthroplasty. She has had no problems with anesthesia in the past, no problems with her prior knee replacement, greater than 4 metabolic equivalents of cardiac capacity, EKG unchanged from 2017, ordering routine labs. Return as needed.   ___________________________________________ Gwen Her. Dianah Field, M.D., ABFM., CAQSM. Primary Care and Sports Medicine [facility]  Adjunct Professor of [place] [employer] [employer] [employer]

## 2019-05-25 LAB — COMPLETE METABOLIC PANEL WITH GFR
AG Ratio: 1.9 (calc) (ref 1.0–2.5)
ALT: 46 U/L — ABNORMAL HIGH (ref 6–29)
AST: 35 U/L (ref 10–35)
Albumin: 4.3 g/dL (ref 3.6–5.1)
Alkaline phosphatase (APISO): 89 U/L (ref 37–153)
BUN/Creatinine Ratio: 24 (calc) — ABNORMAL HIGH (ref 6–22)
BUN: 26 mg/dL — ABNORMAL HIGH (ref 7–25)
CO2: 27 mmol/L (ref 20–32)
Calcium: 10.2 mg/dL (ref 8.6–10.4)
Chloride: 104 mmol/L (ref 98–110)
Creat: 1.09 mg/dL — ABNORMAL HIGH (ref 0.50–1.05)
GFR, Est African American: 66 mL/min/{1.73_m2} (ref >=60)
GFR, Est Non African American: 57 mL/min/{1.73_m2} — ABNORMAL LOW (ref >=60)
Globulin: 2.3 g/dL (calc) (ref 1.9–3.7)
Glucose, Bld: 102 mg/dL — ABNORMAL HIGH (ref 65–99)
Potassium: 4.1 mmol/L (ref 3.5–5.3)
Sodium: 141 mmol/L (ref 135–146)
Total Bilirubin: 0.5 mg/dL (ref 0.2–1.2)
Total Protein: 6.6 g/dL (ref 6.1–8.1)

## 2019-05-25 LAB — CBC
HCT: 43.9 % (ref 35.0–45.0)
Hemoglobin: 14.9 g/dL (ref 11.7–15.5)
MCH: 31.5 pg (ref 27.0–33.0)
MCHC: 33.9 g/dL (ref 32.0–36.0)
MCV: 92.8 fL (ref 80.0–100.0)
MPV: 12.3 fL (ref 7.5–12.5)
Platelets: 321 10*3/uL (ref 140–400)
RBC: 4.73 10*6/uL (ref 3.80–5.10)
RDW: 12.6 % (ref 11.0–15.0)
WBC: 6.6 10*3/uL (ref 3.8–10.8)

## 2019-05-25 LAB — PROTIME-INR
INR: 0.9
Prothrombin Time: 9.7 s (ref 9.0–11.5)

## 2019-05-25 LAB — APTT: aPTT: 25 s (ref 22–34)

## 2019-05-27 ENCOUNTER — Other Ambulatory Visit: Payer: Self-pay | Admitting: Sports Medicine

## 2019-05-27 DIAGNOSIS — Z1231 Encounter for screening mammogram for malignant neoplasm of breast: Secondary | ICD-10-CM

## 2019-06-29 ENCOUNTER — Other Ambulatory Visit: Payer: Self-pay | Admitting: Sports Medicine

## 2019-07-31 ENCOUNTER — Ambulatory Visit (INDEPENDENT_AMBULATORY_CARE_PROVIDER_SITE_OTHER): Payer: BC Managed Care – PPO

## 2019-07-31 ENCOUNTER — Other Ambulatory Visit: Payer: Self-pay

## 2019-07-31 DIAGNOSIS — Z1231 Encounter for screening mammogram for malignant neoplasm of breast: Secondary | ICD-10-CM | POA: Diagnosis not present

## 2019-08-01 ENCOUNTER — Encounter: Payer: Self-pay | Admitting: Sports Medicine

## 2019-08-01 ENCOUNTER — Encounter: Payer: Self-pay | Admitting: Obstetrics & Gynecology

## 2019-08-01 ENCOUNTER — Ambulatory Visit (INDEPENDENT_AMBULATORY_CARE_PROVIDER_SITE_OTHER): Payer: BC Managed Care – PPO | Admitting: Obstetrics & Gynecology

## 2019-08-01 VITALS — BP 117/81 | HR 98 | Ht 64.0 in | Wt 220.0 lb

## 2019-08-01 DIAGNOSIS — Z1151 Encounter for screening for human papillomavirus (HPV): Secondary | ICD-10-CM | POA: Diagnosis not present

## 2019-08-01 DIAGNOSIS — Z124 Encounter for screening for malignant neoplasm of cervix: Secondary | ICD-10-CM | POA: Diagnosis not present

## 2019-08-01 DIAGNOSIS — Z01419 Encounter for gynecological examination (general) (routine) without abnormal findings: Secondary | ICD-10-CM | POA: Diagnosis not present

## 2019-08-01 NOTE — Progress Notes (Signed)
Subjective:    Wanda Valencia is a 53 y.o. married P2 (30 yo daughter and 69 yo son, 3 grands)  who presents for an annual exam. The patient has no complaints today. The patient is sexually active. GYN screening history: last pap: was normal. The patient wears seatbelts: yes. The patient participates in regular exercise: yes. Has the patient ever been transfused or tattooed?: no. The patient reports that there is not domestic violence in her life.   Menstrual History: OB History    Gravida  2   Para  2   Term  2   Preterm      AB      Living  2     SAB      TAB      Ectopic      Multiple      Live Births              Menarche age: 39 Patient's last menstrual period was 09/29/2011 (lmp unknown).    The following portions of the patient's history were reviewed and updated as appropriate: allergies, current medications, past family history, past medical history, past social history, past surgical history and problem list.  Review of Systems Pertinent items are noted in HPI.   Married for 33 years FH- + breast cancer in her sister (dx'd at 44 yo and BRCA negative), + cervical cancer in her paternal aunt, no cancer cancer, + kidney and tongue cancer in her father Retired from [employer], keeps her 2yo  granddaughter  mammogram and flu vaccine utd  Objective:    BP 117/81   Pulse 98   Ht _0  (1.626 m)   Wt 220 lb (99.8 kg)   LMP 09/29/2011 (LMP Unknown)   BMI 37.76 kg/m   General Appearance:    Alert, cooperative, no distress, appears stated age  Head:    Normocephalic, without obvious abnormality, atraumatic  Eyes:    PERRL, conjunctiva/corneas clear, EOM's intact, fundi    benign, both eyes  Ears:    Normal TM's and external ear canals, both ears  Nose:   Nares normal, septum midline, mucosa normal, no drainage    or sinus tenderness  Throat:   Lips, mucosa, and tongue normal; teeth and gums normal  Neck:   Supple, symmetrical, trachea midline, no  adenopathy;    thyroid:  no enlargement/tenderness/nodules; no carotid   bruit or JVD  Back:     Symmetric, no curvature, ROM normal, no CVA tenderness  Lungs:     Clear to auscultation bilaterally, respirations unlabored  Chest Wall:    No tenderness or deformity   Heart:    Regular rate and rhythm, 1/6 SEM (but normal EKG and CXR recently)  Breast Exam:    No tenderness, masses, or nipple abnormality  Abdomen:     Soft, non-tender, bowel sounds active all four quadrants,    no masses, no organomegaly  Genitalia:    Normal female without lesion, discharge or tenderness, normal size and shape, anteverted, mobile, non-tender, normal adnexal exam      Extremities:   Extremities normal, atraumatic, no cyanosis or edema  Pulses:   2+ and symmetric all extremities  Skin:   Skin color, texture, turgor normal, no rashes or lesions  Lymph nodes:   Cervical, supraclavicular, and axillary nodes normal  Neurologic:   CNII-XII intact, normal strength, sensation and reflexes    throughout  .    Assessment:    Healthy female exam.  Plan:     Thin prep Pap smear. with cotesting Rec flu vaccine with Dr. Darene Lamer Rec discuss kidney function and LFTs with Dr. Darene Lamer.

## 2019-08-02 ENCOUNTER — Other Ambulatory Visit: Payer: Self-pay | Admitting: Sports Medicine

## 2019-08-02 DIAGNOSIS — R928 Other abnormal and inconclusive findings on diagnostic imaging of breast: Secondary | ICD-10-CM

## 2019-08-02 NOTE — Telephone Encounter (Signed)
D T.,   Patient calling imaging concerned about the additional imaging needed post mammogram that has not been ordered. If you will enter the orders, I can send pt the info to contact the breast center to get scheduled

## 2019-08-02 NOTE — Telephone Encounter (Signed)
Spoke with pt and let her know that [facility] does their own ordering of extra images and that the signed order had already been faxed back to their office.  Pt is scheduled for next Tuesday.

## 2019-08-06 ENCOUNTER — Ambulatory Visit: Payer: BC Managed Care – PPO

## 2019-08-06 ENCOUNTER — Other Ambulatory Visit: Payer: Self-pay

## 2019-08-06 ENCOUNTER — Ambulatory Visit
Admission: RE | Admit: 2019-08-06 | Discharge: 2019-08-06 | Disposition: A | Discharge status: Home / Self Care | Payer: BC Managed Care – PPO | Source: Ambulatory Visit | Attending: Sports Medicine | Admitting: Sports Medicine

## 2019-08-06 DIAGNOSIS — R928 Other abnormal and inconclusive findings on diagnostic imaging of breast: Secondary | ICD-10-CM

## 2019-08-07 LAB — CYTOLOGY - PAP
Comment: NEGATIVE
Diagnosis: NEGATIVE
High risk HPV: NEGATIVE

## 2019-12-24 NOTE — Telephone Encounter (Signed)
Wanda Valencia has probably already had chickenpox which means she already has the shingles virus, unless she is immunocompromised or pregnant there are really no precautions that she needs to be taking.  Totally okay to go ahead with the vaccine.  She might even get a better immune response having had a small exposure to shingles.

## 2020-01-20 ENCOUNTER — Other Ambulatory Visit: Payer: Self-pay | Admitting: Sports Medicine

## 2020-01-27 ENCOUNTER — Ambulatory Visit: Payer: BC Managed Care – PPO | Admitting: Sports Medicine

## 2020-03-31 ENCOUNTER — Other Ambulatory Visit: Payer: Self-pay

## 2020-03-31 ENCOUNTER — Ambulatory Visit (INDEPENDENT_AMBULATORY_CARE_PROVIDER_SITE_OTHER): Payer: BC Managed Care – PPO

## 2020-03-31 ENCOUNTER — Encounter: Payer: Self-pay | Admitting: Sports Medicine

## 2020-03-31 ENCOUNTER — Ambulatory Visit: Payer: BC Managed Care – PPO | Admitting: Sports Medicine

## 2020-03-31 DIAGNOSIS — R921 Mammographic calcification found on diagnostic imaging of breast: Secondary | ICD-10-CM | POA: Insufficient documentation

## 2020-03-31 DIAGNOSIS — R0789 Other chest pain: Secondary | ICD-10-CM

## 2020-03-31 DIAGNOSIS — N644 Mastodynia: Secondary | ICD-10-CM | POA: Insufficient documentation

## 2020-03-31 NOTE — Progress Notes (Addendum)
    Procedures performed today:    None.  Independent interpretation of notes and tests performed by another provider:   None.  Brief History, Exam, Impression, and Recommendations:    Right-sided chest wall pain This is a pleasant 54 year old female, for a couple of weeks she is in pain in her right chest wall, lateral to her breast. She does have a history of several mammograms, the most recent of which was completely unremarkable. Pain is not pleuritic, no real recent risk factors for DVT/PE. No upper respiratory or viral symptoms. Breast exam was completely unremarkable and conducted with a female chaperone. Cardiovascular exam is unremarkable. We will screen her with a CBC, D-dimer, chest x-ray, but I think we can just watch this for now.  Elevated D-dimer, stat CT angiogram of the pulmonary arteries downstairs today.    ___________________________________________ Ihor Austin. Benjamin Stain, M.D., ABFM., CAQSM. Primary Care and Sports Medicine [facility]  Adjunct Instructor of Family Medicine  [employer]

## 2020-03-31 NOTE — Assessment & Plan Note (Addendum)
This is a pleasant 54 year old female, for a couple of weeks she is in pain in her right chest wall, lateral to her breast. She does have a history of several mammograms, the most recent of which was completely unremarkable. Pain is not pleuritic, no real recent risk factors for DVT/PE. No upper respiratory or viral symptoms. Breast exam was completely unremarkable and conducted with a female chaperone. Cardiovascular exam is unremarkable. We will screen her with a CBC, D-dimer, chest x-ray, but I think we can just watch this for now.  Elevated D-dimer, stat CT angiogram of the pulmonary arteries downstairs today.

## 2020-04-01 LAB — CBC WITH DIFFERENTIAL/PLATELET
Absolute Monocytes: 778 cells/uL (ref 200–950)
Basophils Absolute: 28 cells/uL (ref 0–200)
Basophils Relative: 0.5 %
Eosinophils Absolute: 252 cells/uL (ref 15–500)
Eosinophils Relative: 4.5 %
HCT: 43.2 % (ref 35.0–45.0)
Hemoglobin: 14.6 g/dL (ref 11.7–15.5)
Lymphs Abs: 1271 cells/uL (ref 850–3900)
MCH: 31 pg (ref 27.0–33.0)
MCHC: 33.8 g/dL (ref 32.0–36.0)
MCV: 91.7 fL (ref 80.0–100.0)
MPV: 11.9 fL (ref 7.5–12.5)
Monocytes Relative: 13.9 %
Neutro Abs: 3270 cells/uL (ref 1500–7800)
Neutrophils Relative %: 58.4 %
Platelets: 281 10*3/uL (ref 140–400)
RBC: 4.71 10*6/uL (ref 3.80–5.10)
RDW: 12.7 % (ref 11.0–15.0)
Total Lymphocyte: 22.7 %
WBC: 5.6 10*3/uL (ref 3.8–10.8)

## 2020-04-01 LAB — D-DIMER, QUANTITATIVE: D-Dimer, Quant: 0.93 mcg/mL FEU — ABNORMAL HIGH (ref <0.50)

## 2020-04-01 NOTE — Addendum Note (Signed)
Addended by: Monica Becton on: 04/01/2020 08:31 AM   Modules accepted: Orders

## 2020-05-08 DIAGNOSIS — R0789 Other chest pain: Secondary | ICD-10-CM

## 2020-05-08 DIAGNOSIS — N644 Mastodynia: Secondary | ICD-10-CM

## 2020-05-27 ENCOUNTER — Ambulatory Visit: Payer: BC Managed Care – PPO

## 2020-05-27 ENCOUNTER — Other Ambulatory Visit: Payer: Self-pay

## 2020-05-27 ENCOUNTER — Ambulatory Visit
Admission: RE | Admit: 2020-05-27 | Discharge: 2020-05-27 | Disposition: A | Discharge status: Home / Self Care | Payer: BC Managed Care – PPO | Source: Ambulatory Visit | Attending: Sports Medicine | Admitting: Sports Medicine

## 2020-06-29 ENCOUNTER — Other Ambulatory Visit: Payer: Self-pay | Admitting: Sports Medicine

## 2020-06-29 DIAGNOSIS — Z1231 Encounter for screening mammogram for malignant neoplasm of breast: Secondary | ICD-10-CM

## 2020-07-18 ENCOUNTER — Other Ambulatory Visit: Payer: Self-pay | Admitting: Sports Medicine

## 2020-08-03 ENCOUNTER — Other Ambulatory Visit: Payer: Self-pay

## 2020-08-03 ENCOUNTER — Encounter: Payer: Self-pay | Admitting: Obstetrics and Gynecology

## 2020-08-03 ENCOUNTER — Ambulatory Visit (INDEPENDENT_AMBULATORY_CARE_PROVIDER_SITE_OTHER): Payer: BC Managed Care – PPO | Admitting: Obstetrics and Gynecology

## 2020-08-03 VITALS — BP 126/76 | HR 72 | Resp 16 | Ht 64.0 in | Wt 217.0 lb

## 2020-08-03 DIAGNOSIS — Z01419 Encounter for gynecological examination (general) (routine) without abnormal findings: Secondary | ICD-10-CM | POA: Diagnosis not present

## 2020-08-03 DIAGNOSIS — Z803 Family history of malignant neoplasm of breast: Secondary | ICD-10-CM

## 2020-08-03 NOTE — Patient Instructions (Signed)
Check with your insurance about coverage for [employer] by Avelina Laine.

## 2020-08-03 NOTE — Progress Notes (Signed)
GYNECOLOGY ANNUAL PREVENTATIVE CARE ENCOUNTER NOTE  Subjective:   Wanda Valencia is a 54 y.o. G80P2002 female here for a annual gynecologic exam. Current coplaints: none.    Denies abnormal vaginal bleeding, discharge, pelvic pain, problems with intercourse or other gynecologic concerns. Declines STI screen.   Gynecologic History Patient's last menstrual period was 09/29/2011 (lmp unknown). Contraception: post menopausal status Last Pap: 07/2019. Results: normal Last mammogram: 05/2020. Results: Birads 1 DEXA: has never had  Obstetric History OB History  Gravida Para Term Preterm AB Living  _0 SAB TAB Ectopic Multiple Live Births               # Outcome Date GA Lbr Len/2nd Weight Sex Delivery Anes PTL Lv  2 Term           1 Term             Past Medical History:  Diagnosis Date  . Arthritis    "right knee and thumb" (05/08/2015)  . Chronic bronchitis ([place])    "born w/it; get it alot"  . Colon polyp 2016   1 removed   . Detached retina, right   . Family history of adverse reaction to anesthesia    "my sister has reactions to about anything; anesthesia included"  . Gall stones 04/2018  . GERD (gastroesophageal reflux disease)   . Hx: UTI (urinary tract infection)   . Hypertension   . Incontinence   . Kidney stones   . Obesity   . Overactive bladder   . 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 UTI (urinary tract infection)     Past Surgical History:  Procedure Laterality Date  . [place]; 1991  . COLONOSCOPY  2016   [facility]ist with [facility] in [place]  . CYSTOSCOPY W/ STONE MANIPULATION  09/2006  . JOINT REPLACEMENT    . THUMB FUSION Right 2017  . 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   Procedure: TOTAL KNEE ARTHROPLASTY;  Surgeon: Dorna Leitz, MD;  Location: [facility];  Service: Orthopedics;  Laterality: Right;  . TUBAL LIGATION  10/14/2011    Current  Outpatient Medications on File Prior to Visit  Medication Sig Dispense Refill  . aspirin 81 MG tablet Take 81 mg by mouth daily.    . cetirizine (ZYRTEC) 10 MG tablet Take 10 mg by mouth once.    Marland Kitchen CRANBERRY PO Take 1 capsule by mouth.     . FIBER PO Take 2 capsules by mouth daily. Chewable    . fluticasone (FLONASE) 50 MCG/ACT nasal spray PLEASE SEE ATTACHED FOR DETAILED DIRECTIONS 48 g 3  . lisinopril-hydrochlorothiazide (ZESTORETIC) 20-25 MG tablet TAKE 1 TABLET BY MOUTH EVERY DAY 90 tablet 1  . pantoprazole (PROTONIX) 40 MG tablet Take by mouth.     No current facility-administered medications on file prior to visit.    Allergies  Allergen Reactions  . Other     Pt does not do well with swallowing pills Other reaction(s): Other (See Comments) Runny nose and watery eyes.    Social History   Socioeconomic History  . Marital status: Married    Spouse name: Not on file  . Number of children: 2  . Years of education: Not on file  . Highest education level: Not on file  Occupational History  . Occupation: retired  Tobacco Use  . Smoking status: Former  Smoker    Packs/day: 0.10    Years: 2.00    Pack years: 0.20    Types: Cigarettes    Quit date: 09/27/1983    Years since quitting: 36.8  . Smokeless tobacco: Never Used  Vaping Use  . Vaping Use: Never used  Substance and Sexual Activity  . Alcohol use: No  . Drug use: No  . Sexual activity: Yes    Birth control/protection: Post-menopausal  Other Topics Concern  . Not on file  Social History Narrative  . Not on file   Social Determinants of Health   Financial Resource Strain:   . Difficulty of Paying Living Expenses: Not on file  Food Insecurity:   . Worried About Charity fundraiser in the Last Year: Not on file  . Ran Out of Food in the Last Year: Not on file  Transportation Needs:   . Lack of Transportation (Medical): Not on file  . Lack of Transportation (Non-Medical): Not on file  Physical Activity:   .  Days of Exercise per Week: Not on file  . Minutes of Exercise per Session: Not on file  Stress:   . Feeling of Stress : Not on file  Social Connections:   . Frequency of Communication with Friends and Family: Not on file  . Frequency of Social Gatherings with Friends and Family: Not on file  . Attends Religious Services: Not on file  . Active Member of Clubs or Organizations: Not on file  . Attends Archivist Meetings: Not on file  . Marital Status: Not on file  Intimate Partner Violence:   . Fear of Current or Ex-Partner: Not on file  . Emotionally Abused: Not on file  . Physically Abused: Not on file  . Sexually Abused: Not on file    Family History  Problem Relation Age of Onset  . Hypertension Mother   . Depression Mother   . Colon polyps Mother   . Atrial fibrillation Mother   . Irritable bowel syndrome Mother   . Barrett's esophagus Mother   . Hypertension Father   . Cancer Father        tongue/kidney and kidney removed   . Diabetes Father   . Breast cancer Sister        dx at the age 40  . Colon cancer Neg Hx   . Esophageal cancer Neg Hx   . Stomach cancer Neg Hx   . Rectal cancer Neg Hx     The following portions of the patient's history were reviewed and updated as appropriate: allergies, current medications, past family history, past medical history, past social history, past surgical history and problem list.  Review of Systems Pertinent items are noted in HPI.   Objective:  BP 126/76   Pulse 72   Resp 16   Ht _0  (1.626 m)   Wt 217 lb (98.4 kg)   LMP 09/29/2011 (LMP Unknown)   BMI 37.25 kg/m  CONSTITUTIONAL: Well-developed, well-nourished female in no acute distress.  HENT:  Normocephalic, atraumatic, External right and left ear normal. Oropharynx is clear and moist EYES: Conjunctivae and EOM are normal. Pupils are equal, round, and reactive to light. No scleral icterus.  NECK: Normal range of motion, supple, no masses.  Normal thyroid.    SKIN: Skin is warm and dry. No rash noted. Not diaphoretic. No erythema. No pallor. NEUROLOGIC: Alert and oriented to person, place, and time. Normal reflexes, muscle tone coordination. No cranial nerve deficit noted. PSYCHIATRIC:  Normal mood and affect. Normal behavior. Normal judgment and thought content. CARDIOVASCULAR: Normal heart rate noted RESPIRATORY: Effort normal, no problems with respiration noted. BREASTS: Symmetric in size. No masses, skin changes, nipple drainage, or lymphadenopathy. ABDOMEN: Soft, no distention noted.  No tenderness, rebound or guarding.  PELVIC: Normal appearing external genitalia; normal appearing vaginal mucosa and cervix.  No abnormal discharge noted.  Normal uterine size, no other palpable masses, no uterine or adnexal tenderness. MUSCULOSKELETAL: Normal range of motion. No tenderness.  No cyanosis, clubbing, or edema.  2+ distal pulses.  Exam done with chaperone present.   Assessment and Plan:   1. Well woman exam Healthy female exam  2. Family history of breast cancer Sister with br cancer age 23, neg for BRCA Offered [employer], patient will check with insurance to see if covered   Will follow up results of pap smear and manage accordingly. Encouraged improvement in diet and exercise.  COVID vaccine  Declines STI screen. Mammogram UTD Referral for colonoscopy n/a Flu vaccine declined DEXA not due based on age  Routine preventative health maintenance measures emphasized. Please refer to After Visit Summary for other counseling recommendations.   Total face-to-face time with patient: 25 minutes. Over 50% of encounter was spent on counseling and coordination of care.   Feliz Beam, M.D. Attending Center for [employer] [facility] farm manager)

## 2020-08-05 ENCOUNTER — Ambulatory Visit (INDEPENDENT_AMBULATORY_CARE_PROVIDER_SITE_OTHER): Payer: BC Managed Care – PPO

## 2020-08-05 ENCOUNTER — Other Ambulatory Visit: Payer: Self-pay

## 2020-08-05 DIAGNOSIS — Z1231 Encounter for screening mammogram for malignant neoplasm of breast: Secondary | ICD-10-CM

## 2020-08-06 ENCOUNTER — Ambulatory Visit: Payer: BC Managed Care – PPO

## 2020-08-28 DIAGNOSIS — Z1152 Encounter for screening for COVID-19: Secondary | ICD-10-CM

## 2020-08-28 DIAGNOSIS — I10 Essential (primary) hypertension: Secondary | ICD-10-CM

## 2020-09-02 LAB — CBC
HCT: 41.1 % (ref 35.0–45.0)
Hemoglobin: 14 g/dL (ref 11.7–15.5)
MCH: 31.1 pg (ref 27.0–33.0)
MCHC: 34.1 g/dL (ref 32.0–36.0)
MCV: 91.3 fL (ref 80.0–100.0)
MPV: 11.8 fL (ref 7.5–12.5)
Platelets: 279 10*3/uL (ref 140–400)
RBC: 4.5 10*6/uL (ref 3.80–5.10)
RDW: 12.2 % (ref 11.0–15.0)
WBC: 4.6 10*3/uL (ref 3.8–10.8)

## 2020-09-02 LAB — SARS COV-2 SEROLOGY(COVID-19)AB(IGG,IGM),IMMUNOASSAY
SARS CoV-2 AB IgG: NEGATIVE
SARS CoV-2 IgM: NEGATIVE

## 2020-09-02 LAB — LIPID PANEL
Cholesterol: 154 mg/dL (ref <200)
HDL: 42 mg/dL — ABNORMAL LOW (ref >=50)
LDL Cholesterol (Calc): 92 mg/dL (calc)
Non-HDL Cholesterol (Calc): 112 mg/dL (calc) (ref <130)
Total CHOL/HDL Ratio: 3.7 (calc) (ref <5.0)
Triglycerides: 102 mg/dL (ref <150)

## 2020-09-02 LAB — COMPREHENSIVE METABOLIC PANEL
AG Ratio: 2.2 (calc) (ref 1.0–2.5)
ALT: 18 U/L (ref 6–29)
AST: 21 U/L (ref 10–35)
Albumin: 4.2 g/dL (ref 3.6–5.1)
Alkaline phosphatase (APISO): 77 U/L (ref 37–153)
BUN: 19 mg/dL (ref 7–25)
CO2: 30 mmol/L (ref 20–32)
Calcium: 9.4 mg/dL (ref 8.6–10.4)
Chloride: 104 mmol/L (ref 98–110)
Creat: 0.9 mg/dL (ref 0.50–1.05)
Globulin: 1.9 g/dL (calc) (ref 1.9–3.7)
Glucose, Bld: 90 mg/dL (ref 65–99)
Potassium: 4.4 mmol/L (ref 3.5–5.3)
Sodium: 141 mmol/L (ref 135–146)
Total Bilirubin: 0.4 mg/dL (ref 0.2–1.2)
Total Protein: 6.1 g/dL (ref 6.1–8.1)

## 2020-09-02 LAB — HEMOGLOBIN A1C
Hgb A1c MFr Bld: 5.7 % of total Hgb — ABNORMAL HIGH (ref <5.7)
Mean Plasma Glucose: 117 mg/dL
eAG (mmol/L): 6.5 mmol/L

## 2020-09-02 LAB — TSH: TSH: 1.71 mIU/L (ref 0.40–4.50)

## 2020-09-09 ENCOUNTER — Ambulatory Visit: Payer: BC Managed Care – PPO | Admitting: Medical-Surgical

## 2020-12-02 ENCOUNTER — Emergency Department (INDEPENDENT_AMBULATORY_CARE_PROVIDER_SITE_OTHER): Payer: BC Managed Care – PPO

## 2020-12-02 ENCOUNTER — Emergency Department
Admission: EM | Admit: 2020-12-02 | Discharge: 2020-12-02 | Disposition: A | Discharge status: Home / Self Care | Payer: BC Managed Care – PPO | Source: Home / Self Care | Attending: Family Medicine | Admitting: Family Medicine

## 2020-12-02 ENCOUNTER — Other Ambulatory Visit: Payer: Self-pay

## 2020-12-02 ENCOUNTER — Encounter: Payer: Self-pay | Admitting: Emergency Medicine

## 2020-12-02 ENCOUNTER — Ambulatory Visit: Payer: BC Managed Care – PPO | Admitting: Physician Assistant

## 2020-12-02 DIAGNOSIS — M549 Dorsalgia, unspecified: Secondary | ICD-10-CM

## 2020-12-02 DIAGNOSIS — S29012A Strain of muscle and tendon of back wall of thorax, initial encounter: Secondary | ICD-10-CM

## 2020-12-02 DIAGNOSIS — R42 Dizziness and giddiness: Secondary | ICD-10-CM | POA: Diagnosis not present

## 2020-12-02 NOTE — Discharge Instructions (Addendum)
Apply ice pack for 20 to 30 minutes, 3 to 4 times daily  Continue until pain decreases.  May take Ibuprofen 200mg , 4 tabs every 8 hours with food.  Begin range of motion and stretching exercises as tolerated.  Call immediately if rash develops

## 2020-12-02 NOTE — ED Triage Notes (Signed)
Upper Left back pain, metal taste in mouth,slight left arm pain,  light headed. Took 4 baby Asprin.

## 2020-12-02 NOTE — ED Provider Notes (Signed)
[facility]    CSN: [id number] Arrival date & time: 12/02/20  0900      History   Chief Complaint Chief Complaint  Patient presents with  . Back Pain    HPI Wanda Valencia is a 55 y.o. female.   While watching TV last night, patient suddenly experienced dull non-radiating pain in her left upper back, a strange metallic/chemical taste, and slight left arm pain. She also felt slightly light-headed. She took four baby aspirin tabs immediately.  This morning her left upper back pain was stabbing in nature. She denies chest pain, nausea/vomiting, cough, and pleuritic pain.  This morning she continue to feel slightly off-balance when walking, but denies vertigo.  She denies paresthesias, loss of strength, headache, and other neurologic symptoms. She recalls no recent injury or change in physical activities.  The history is provided by the patient.    Past Medical History:  Diagnosis Date  . Arthritis    "right knee and thumb" (05/08/2015)  . Chronic bronchitis ([facility])    "born w/it; get it alot"  . Colon polyp 2016   1 removed   . Detached retina, right   . Family history of adverse reaction to anesthesia    "my sister has reactions to about anything; anesthesia included"  . Gall stones 04/2018  . GERD (gastroesophageal reflux disease)   . Hx: UTI (urinary tract infection)   . Hypertension   . Incontinence   . Kidney stones   . Obesity   . Overactive bladder   . 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 UTI (urinary tract infection)     Patient Active Problem List   Diagnosis Date Noted  . Breast pain, right 03/31/2020  . Preop examination 05/24/2019  . Piezogenic pedal papule, 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  . Primary osteoarthritis of left knee 09/03/2018  . Radiculitis of left cervical region 07/16/2018  . Acute frontal sinusitis 06/29/2018  . Urine leukocytes 04/06/2018  . History of acute pyelonephritis 04/06/2018  . Pes anserine bursitis 01/18/2018   . Rhinitis 11/28/2016  . Skin tag 07/29/2016  . Cystitis 04/15/2016  . History of arthroplasty of right knee 05/08/2015  . Plantar fasciitis, left 08/25/2014  . Fatty liver disease, nonalcoholic 06/17/2014  . Biliary colic 06/17/2014  . History of kidney stones 05/20/2014  . Hyperlipidemia LDL goal < 100 11/29/2012  . Annual physical exam 07/17/2012  . 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 Obesity 07/17/2012  . Essential hypertension, benign 07/17/2012  . Right first Metacarpophalangeal joint pain 04/30/2012    Past Surgical History:  Procedure Laterality Date  . CESAREAN SECTION  1989; 1991  . COLONOSCOPY  2016   [facility] Specialist with [facility] in [place]  . CYSTOSCOPY W/ STONE MANIPULATION  09/2006  . JOINT REPLACEMENT    . THUMB FUSION Right 2017  . 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   Procedure: TOTAL KNEE ARTHROPLASTY;  Surgeon: Jodi Geralds, MD;  Location: [facility];  Service: Orthopedics;  Laterality: Right;  . TUBAL LIGATION  10/14/2011    OB History    Gravida  2   Para  2   Term  2   Preterm      AB      Living  2     SAB      IAB      Ectopic      Multiple      Live Births  Home Medications    Prior to Admission medications   Medication Sig Start Date End Date Taking? Authorizing Provider  aspirin 81 MG tablet Take 81 mg by mouth daily.    [provider]  cetirizine (ZYRTEC) 10 MG tablet Take 10 mg by mouth once.    [provider]  FIBER PO Take 2 capsules by mouth daily. Chewable    [provider]  lisinopril-hydrochlorothiazide (ZESTORETIC) 20-25 MG tablet TAKE 1 TABLET BY MOUTH EVERY DAY 07/20/20   Monica Becton, MD  pantoprazole (PROTONIX) 40 MG tablet Take by mouth.    [provider]    Family History Family History  Problem Relation Age of Onset  . Hypertension Mother   . Depression Mother   . Colon  polyps Mother   . Atrial fibrillation Mother   . Irritable bowel syndrome Mother   . Barrett's esophagus Mother   . Hypertension Father   . Cancer Father        tongue/kidney and kidney removed   . Diabetes Father   . Breast cancer Sister        dx at the age 36  . Colon cancer Neg Hx   . Esophageal cancer Neg Hx   . Stomach cancer Neg Hx   . Rectal cancer Neg Hx     Social History Social History   Tobacco Use  . Smoking status: Former Smoker    Packs/day: 0.10    Years: 2.00    Pack years: 0.20    Types: Cigarettes    Quit date: 09/27/1983    Years since quitting: 37.2  . Smokeless tobacco: Never Used  Vaping Use  . Vaping Use: Never used  Substance Use Topics  . Alcohol use: No  . Drug use: No     Allergies   Other   Review of Systems Review of Systems  Constitutional: Negative for activity change, appetite change, chills, diaphoresis, fatigue and fever.  HENT: Negative.   Eyes: Negative.   Respiratory: Negative for cough, chest tightness and shortness of breath.   Cardiovascular: Negative for chest pain, palpitations and leg swelling.  Gastrointestinal: Negative for abdominal pain, nausea and vomiting.  Genitourinary: Negative.   Musculoskeletal: Positive for back pain.  Skin: Negative for rash.  Neurological: Negative for dizziness, tremors, syncope, facial asymmetry, speech difficulty, weakness, light-headedness, numbness and headaches.  Hematological: Negative for adenopathy.     Physical Exam Triage Vital Signs ED Triage Vitals  Enc Vitals Group     BP 12/02/20 0926 112/73     Pulse Rate 12/02/20 0926 77     Resp 12/02/20 0926 18     Temp 12/02/20 0926 98.4 F (36.9 C)     Temp Source 12/02/20 0926 Oral     SpO2 12/02/20 0926 97 %     Weight 12/02/20 0927 215 lb (97.5 kg)     Height 12/02/20 0927 5\' 4"  (1.626 m)     Head Circumference --      Peak Flow --      Pain Score 12/02/20 0927 2     Pain Loc --      Pain Edu? --      Excl. in GC?  --    No data found.  Updated Vital Signs BP 112/73 (BP Location: Right Arm)   Pulse 77   Temp 98.4 F (36.9 C) (Oral)   Resp 18   Ht 5\' 4"  (1.626 m)   Wt 97.5 kg   LMP 09/29/2011 (  LMP Unknown)   SpO2 97%   BMI 36.90 kg/m   Visual Acuity Right Eye Distance:   Left Eye Distance:   Bilateral Distance:    Right Eye Near:   Left Eye Near:    Bilateral Near:     Physical Exam Vitals and nursing note reviewed.  Constitutional:      General: She is not in acute distress.    Appearance: She is obese.  HENT:     Head: Normocephalic.     Right Ear: Tympanic membrane, ear canal and external ear normal.     Left Ear: Tympanic membrane, ear canal and external ear normal.     Nose: Nose normal.     Mouth/Throat:     Pharynx: Oropharynx is clear.  Eyes:     Extraocular Movements: Extraocular movements intact.     Conjunctiva/sclera: Conjunctivae normal.     Pupils: Pupils are equal, round, and reactive to light.  Cardiovascular:     Rate and Rhythm: Normal rate and regular rhythm.     Heart sounds: Normal heart sounds.  Pulmonary:     Breath sounds: Normal breath sounds.       Comments: There is distinct tenderness over medial and inferior edges of left scapula as noted on diagram.   Pain elicited by resisted abduction of left shoulder while palpating left rhomboid muscles and left medial scapula.  No rash present at site of tenderness.  Musculoskeletal:        General: No tenderness.     Cervical back: Neck supple.     Right lower leg: No edema.     Left lower leg: No edema.  Lymphadenopathy:     Cervical: No cervical adenopathy.  Skin:    General: Skin is warm and dry.     Findings: No rash.  Neurological:     Mental Status: She is alert and oriented to person, place, and time.     Cranial Nerves: No cranial nerve deficit.     Sensory: No sensory deficit.     Motor: No weakness.     Coordination: Coordination normal.     Gait: Gait normal.     Deep Tendon  Reflexes: Reflexes normal.      UC Treatments / Results  Labs (all labs ordered are listed, but only abnormal results are displayed) Labs Reviewed - No data to display  EKG  Rate:  81 BPM PR:  168 msec QT:  372 msec QTcH:  432 msec QRSD:  88 msec QRS axis:  -26 degrees Interpretation:  normal sinus rhythm; "cannot rule out anterior infarct, age undetermined."  Review of previous records reveals no significant change from EKG done 9//23/20.  Radiology DG Chest 2 View  Result Date: 12/02/2020 CLINICAL DATA:  Stabbing left upper back pain since last night. EXAM: CHEST - 2 VIEW COMPARISON:  Two-view chest x-ray 03/22/2020 FINDINGS: The heart size is normal. No edema or effusion is present. No focal airspace disease present. Axial skeleton is unremarkable. IMPRESSION: Negative two view chest x-ray. Electronically Signed   By: Marin Roberts M.D.   On: 12/02/2020 11:08    Procedures Procedures (including critical care time)  Medications Ordered in UC Medications - No data to display  Initial Impression / Assessment and Plan / UC Course  I have reviewed the triage vital signs and the nursing notes.  Pertinent labs & imaging results that were available during my care of the patient were reviewed by me and considered in my medical  decision making (see chart for details).    Normal chest x-ray and EKG reassuring.  Patient's symptoms most consistent with rhomboid inflammation; however she has had no precipitating event.  May be early shingles (call if rash develops: begin Valtrex). Treat symptomatically for now.    Final Clinical Impressions(s) / UC Diagnoses   Final diagnoses:  Dizziness and giddiness  Rhomboid muscle strain, initial encounter     Discharge Instructions     Apply ice pack for 20 to 30 minutes, 3 to 4 times daily  Continue until pain decreases.  May take Ibuprofen 200mg , 4 tabs every 8 hours with food.  Begin range of motion and stretching exercises as  tolerated.  Call immediately if rash develops    ED Prescriptions    None        Lattie HawBeese, Stephen A, MD 12/04/20 1040

## 2020-12-21 ENCOUNTER — Ambulatory Visit (INDEPENDENT_AMBULATORY_CARE_PROVIDER_SITE_OTHER): Payer: BC Managed Care – PPO

## 2020-12-21 ENCOUNTER — Ambulatory Visit (INDEPENDENT_AMBULATORY_CARE_PROVIDER_SITE_OTHER): Payer: BC Managed Care – PPO | Admitting: Podiatry

## 2020-12-21 ENCOUNTER — Other Ambulatory Visit: Payer: Self-pay

## 2020-12-21 DIAGNOSIS — R600 Localized edema: Secondary | ICD-10-CM | POA: Diagnosis not present

## 2020-12-21 NOTE — Progress Notes (Signed)
   HPI: 55 y.o. female presenting today as a new patient for evaluation of chronic edema to the left foot that is been going on for approximately 10 years now.  Patient states it is not painful however it is aggravating to get certain shoes on.  She denies any history of injury or anything that could have potentially caused the chronic edema in the foot.  She did have a knee replacement surgery LLE 05/2015, however she says the left foot edema was ongoing several years prior to this.  She presents for further treatment and evaluation  Past Medical History:  Diagnosis Date  . Arthritis    "right knee and thumb" (05/08/2015)  . Chronic bronchitis ([facility])    "born w/it; get it alot"  . Colon polyp 2016   1 removed   . Detached retina, right   . Family history of adverse reaction to anesthesia    "my sister has reactions to about anything; anesthesia included"  . Gall stones 04/2018  . GERD (gastroesophageal reflux disease)   . Hx: UTI (urinary tract infection)   . Hypertension   . Incontinence   . Kidney stones   . Obesity   . Overactive bladder   . 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 UTI (urinary tract infection)      Physical Exam: General: The patient is alert and oriented x3 in no acute distress.  Dermatology: Skin is warm, dry and supple bilateral lower extremities. Negative for open lesions or macerations.  Vascular: Palpable pedal pulses bilaterally.  Chronic mild nonpitting edema noted left foot up to the level of the ankle. Capillary refill within normal limits.  Neurological: Epicritic and protective threshold grossly intact bilaterally.   Musculoskeletal Exam: Range of motion within normal limits to all pedal and ankle joints bilateral. Muscle strength 5/5 in all groups bilateral.  There are some postmenopausal lipomas noted bilateral ankles  Radiographic Exam:  Normal osseous mineralization. Joint spaces preserved. No fracture/dislocation/boney  destruction.    Assessment: 1.  Chronic, idiopathic, edema left foot   Plan of Care:  1. Patient evaluated. X-Rays reviewed.  2.  I explained to the patient that I am not entirely certain the eliciting factor for the left foot localized edema.  I would suggest that it would be a lymphedema to the left foot. 3.  Recommend daily compression.  Compression ankle sleeves were dispensed today for the patient 4.  Reassured the patient that although she does have chronic edema to the left foot it should not be detrimental to her health.  This would be more of a palliative care/maintenance pathology  5.  Return to clinic as needed      Felecia Shelling, DPM [facility]  Dr. Felecia Shelling, DPM    [address] [place], [place] [address]                Office [phone]  Fax [phone]

## 2020-12-21 NOTE — Progress Notes (Signed)
g

## 2021-01-15 ENCOUNTER — Other Ambulatory Visit: Payer: Self-pay | Admitting: Sports Medicine

## 2021-02-08 ENCOUNTER — Other Ambulatory Visit: Payer: Self-pay

## 2021-02-08 ENCOUNTER — Other Ambulatory Visit: Payer: Self-pay | Admitting: Sports Medicine

## 2021-02-08 ENCOUNTER — Encounter: Payer: Self-pay | Admitting: Sports Medicine

## 2021-02-08 ENCOUNTER — Ambulatory Visit: Payer: BC Managed Care – PPO | Admitting: Sports Medicine

## 2021-02-08 VITALS — BP 108/67 | HR 83 | Ht 64.0 in | Wt 224.0 lb

## 2021-02-08 DIAGNOSIS — N3281 Overactive bladder: Secondary | ICD-10-CM | POA: Diagnosis not present

## 2021-02-08 DIAGNOSIS — R32 Unspecified urinary incontinence: Secondary | ICD-10-CM

## 2021-02-08 DIAGNOSIS — Z23 Encounter for immunization: Secondary | ICD-10-CM | POA: Diagnosis not present

## 2021-02-08 LAB — POCT URINALYSIS DIP (CLINITEK)
Bilirubin, UA: NEGATIVE
Blood, UA: NEGATIVE
Glucose, UA: NEGATIVE mg/dL
Ketones, POC UA: NEGATIVE mg/dL
Nitrite, UA: NEGATIVE
POC PROTEIN,UA: NEGATIVE
Spec Grav, UA: 1.02 (ref 1.010–1.025)
Urobilinogen, UA: 0.2 E.U./dL
pH, UA: 7 (ref 5.0–8.0)

## 2021-02-08 MED ORDER — LEVOFLOXACIN 750 MG PO TABS: 750.0000 mg | ORAL_TABLET | Freq: Every day | ORAL | 0 refills | Status: DC

## 2021-02-08 MED ORDER — AMITRIPTYLINE HCL 50 MG PO TABS: ORAL_TABLET | ORAL | 3 refills | Status: DC

## 2021-02-08 MED ORDER — PANTOPRAZOLE SODIUM 40 MG PO TBEC: 40.0000 mg | DELAYED_RELEASE_TABLET | Freq: Every day | ORAL | 3 refills | Status: DC

## 2021-02-08 NOTE — Patient Instructions (Signed)
Overactive Bladder, Adult  Overactive bladder is a condition in which a person has a sudden and frequent need to urinate. A person might also leak urine if he or she cannot get to the bathroom fast enough (urinary incontinence). Sometimes, symptoms can interfere with work or social activities. What are the causes? Overactive bladder is associated with poor nerve signals between your bladder and your brain. Your bladder may get the signal to empty before it is full. You may also have very sensitive muscles that make your bladder squeeze too soon. This condition may also be caused by other factors, such as:  Medical conditions: ? Urinary tract infection. ? Infection of nearby tissues. ? Prostate enlargement. ? Bladder stones, inflammation, or tumors. ? Diabetes. ? Muscle or nerve weakness, especially from these conditions:  A spinal cord injury.  Stroke.  Multiple sclerosis.  Parkinson's disease.  Other causes: ? Surgery on the uterus or urethra. ? Drinking too much caffeine or alcohol. ? Certain medicines, especially those that eliminate extra fluid in the body (diuretics). ? Constipation. What increases the risk? You may be at greater risk for overactive bladder if you:  Are an older adult.  Smoke.  Are going through menopause.  Have prostate problems.  Have a neurological disease, such as stroke, dementia, Parkinson's disease, or multiple sclerosis (MS).  Eat or drink alcohol, spicy food, caffeine, and other things that irritate the bladder.  Are overweight or obese. What are the signs or symptoms? Symptoms of this condition include a sudden, strong urge to urinate. Other symptoms include:  Leaking urine.  Urinating 8 or more times a day.  Waking up to urinate 2 or more times overnight. How is this diagnosed? This condition may be diagnosed based on:  Your symptoms and medical history.  A physical exam.  Blood or urine tests to check for possible causes,  such as infection. You may also need to see a health care provider who specializes in urinary tract problems. This is called a urologist. How is this treated? Treatment for overactive bladder depends on the cause of your condition and whether it is mild or severe. Treatment may include:  Bladder training, such as: ? Learning to control the urge to urinate by following a schedule to urinate at regular intervals. ? Doing Kegel exercises to strengthen the pelvic floor muscles that support your bladder.  Special devices, such as: ? Biofeedback. This uses sensors to help you become aware of your body's signals. ? Electrical stimulation. This uses electrodes placed inside the body (implanted) or outside the body. These electrodes send gentle pulses of electricity to strengthen the nerves or muscles that control the bladder. ? Women may use a plastic device, called a pessary, that fits into the vagina and supports the bladder.  Medicines, such as: ? Antibiotics to treat bladder infection. ? Antispasmodics to stop the bladder from releasing urine at the wrong time. ? Tricyclic antidepressants to relax bladder muscles. ? Injections of botulinum toxin type A directly into the bladder tissue to relax bladder muscles.  Surgery, such as: ? A device may be implanted to help manage the nerve signals that control urination. ? An electrode may be implanted to stimulate electrical signals in the bladder. ? A procedure may be done to change the shape of the bladder. This is done only in very severe cases. Follow these instructions at home: Eating and drinking  Make diet or lifestyle changes recommended by your health care provider. These may include: ? Drinking fluids   throughout the day and not only with meals. ? Cutting down on caffeine or alcohol. ? Eating a healthy and balanced diet to prevent constipation. This may include:  Choosing foods that are high in fiber, such as beans, whole grains, and  fresh fruits and vegetables.  Limiting foods that are high in fat and processed sugars, such as fried and sweet foods.   Lifestyle  Lose weight if needed.  Do not use any products that contain nicotine or tobacco. These include cigarettes, chewing tobacco, and vaping devices, such as e-cigarettes. If you need help quitting, ask your health care provider.   General instructions  Take over-the-counter and prescription medicines only as told by your health care provider.  If you were prescribed an antibiotic medicine, take it as told by your health care provider. Do not stop taking the antibiotic even if you start to feel better.  Use any implants or pessary as told by your health care provider.  If needed, wear pads to absorb urine leakage.  Keep a log to track how much and when you drink, and when you need to urinate. This will help your health care provider monitor your condition.  Keep all follow-up visits. This is important. Contact a health care provider if:  You have a fever or chills.  Your symptoms do not get better with treatment.  Your pain and discomfort get worse.  You have more frequent urges to urinate. Get help right away if:  You are not able to control your bladder. Summary  Overactive bladder refers to a condition in which a person has a sudden and frequent need to urinate.  Several conditions may lead to an overactive bladder.  Treatment for overactive bladder depends on the cause and severity of your condition.  Making lifestyle changes, doing Kegel exercises, keeping a log, and taking medicines can help with this condition. This information is not intended to replace advice given to you by your health care provider. Make sure you discuss any questions you have with your health care provider. Document Revised: 06/01/2020 Document Reviewed: 06/01/2020 Elsevier Patient Education  2021 Elsevier Inc.  

## 2021-02-08 NOTE — Progress Notes (Signed)
    Procedures performed today:    None.  Independent interpretation of notes and tests performed by another provider:   None.  Brief History, Exam, Impression, and Recommendations:    Overactive bladder This is a very pleasant 55 year old female, she is a chronic history of urinary incontinence described as a sudden urge to void, occasionally resulting in loss of urine. She has been on Macrobid in the past for frequent UTIs. Her most recent positive urine culture that I have seen was 4 years ago and it grew out minimally resistant Klebsiella, good sensitivity with a good MIC with levofloxacin. She does have leukocytes in her urine today, we will add a 7-day course of Levaquin. Multiple to add amitriptyline per her request as I do think there is underlying overactive bladder. 25 mg nightly for a week then 50 mg p.o. nightly. Return to see me in 4 weeks, we will consider switching to oxybutynin if no better. This is a chronic process with pharmacologic management.    ___________________________________________ Ihor Austin. Benjamin Stain, M.D., ABFM., CAQSM. Primary Care and Sports Medicine [facility]  Adjunct Instructor of Family Medicine  [employer]

## 2021-02-08 NOTE — Assessment & Plan Note (Addendum)
This is a very pleasant 55 year old female, she is a chronic history of urinary incontinence described as a sudden urge to void, occasionally resulting in loss of urine. She has been on Macrobid in the past for frequent UTIs. Her most recent positive urine culture that I have seen was 4 years ago and it grew out minimally resistant Klebsiella, good sensitivity with a good MIC with levofloxacin. She does have leukocytes in her urine today, we will add a 7-day course of Levaquin. Multiple to add amitriptyline per her request as I do think there is underlying overactive bladder. 25 mg nightly for a week then 50 mg p.o. nightly. Return to see me in 4 weeks, we will consider switching to oxybutynin if no better. This is a chronic process with pharmacologic management.

## 2021-02-08 NOTE — Addendum Note (Signed)
Addended by: Carolin Coy on: 02/08/2021 10:43 AM   Modules accepted: Orders

## 2021-02-10 LAB — URINE CULTURE
MICRO NUMBER:: 11897783
SPECIMEN QUALITY:: ADEQUATE

## 2021-02-10 LAB — HOUSE ACCOUNT TRACKING

## 2021-02-17 ENCOUNTER — Ambulatory Visit: Payer: BC Managed Care – PPO | Admitting: Sports Medicine

## 2021-02-17 ENCOUNTER — Other Ambulatory Visit: Payer: Self-pay

## 2021-02-17 ENCOUNTER — Ambulatory Visit (INDEPENDENT_AMBULATORY_CARE_PROVIDER_SITE_OTHER): Payer: BC Managed Care – PPO

## 2021-02-17 DIAGNOSIS — M25552 Pain in left hip: Secondary | ICD-10-CM

## 2021-02-17 DIAGNOSIS — M1612 Unilateral primary osteoarthritis, left hip: Secondary | ICD-10-CM | POA: Insufficient documentation

## 2021-02-17 DIAGNOSIS — R1030 Lower abdominal pain, unspecified: Secondary | ICD-10-CM | POA: Diagnosis not present

## 2021-02-17 MED ORDER — MELOXICAM 15 MG PO TABS: ORAL_TABLET | ORAL | 3 refills | Status: DC

## 2021-02-17 NOTE — Assessment & Plan Note (Signed)
Pleasant 55 year old female with a month of pain in her left groin radiating down the left inner thigh to above the knee. Worse with weightbearing, on exam she has pain with internal rotation consistent with a hip joint pain generator, switching to meloxicam, adding hip x-rays, hip conditioning exercises, I think she just has hip osteoarthritis, return to see me in a month, injection if no better.

## 2021-02-17 NOTE — Progress Notes (Signed)
    Procedures performed today:    None.  Independent interpretation of notes and tests performed by another provider:   None.  Brief History, Exam, Impression, and Recommendations:    Left hip pain Pleasant 55 year old female with a month of pain in her left groin radiating down the left inner thigh to above the knee. Worse with weightbearing, on exam she has pain with internal rotation consistent with a hip joint pain generator, switching to meloxicam, adding hip x-rays, hip conditioning exercises, I think she just has hip osteoarthritis, return to see me in a month, injection if no better.    ___________________________________________ Ihor Austin. Benjamin Stain, M.D., ABFM., CAQSM. Primary Care and Sports Medicine [facility]  Adjunct Instructor of Family Medicine  [employer]

## 2021-03-02 ENCOUNTER — Other Ambulatory Visit: Payer: Self-pay | Admitting: Sports Medicine

## 2021-03-02 DIAGNOSIS — N3281 Overactive bladder: Secondary | ICD-10-CM

## 2021-03-04 ENCOUNTER — Other Ambulatory Visit: Payer: Self-pay

## 2021-03-08 ENCOUNTER — Ambulatory Visit: Payer: BC Managed Care – PPO | Admitting: Sports Medicine

## 2021-03-08 ENCOUNTER — Other Ambulatory Visit: Payer: Self-pay

## 2021-03-08 DIAGNOSIS — N3281 Overactive bladder: Secondary | ICD-10-CM

## 2021-03-08 DIAGNOSIS — L309 Dermatitis, unspecified: Secondary | ICD-10-CM | POA: Insufficient documentation

## 2021-03-08 DIAGNOSIS — M1612 Unilateral primary osteoarthritis, left hip: Secondary | ICD-10-CM | POA: Diagnosis not present

## 2021-03-08 MED ORDER — TRIAMCINOLONE ACETONIDE 0.5 % EX CREA: 1.0000 "application " | TOPICAL_CREAM | Freq: Two times a day (BID) | CUTANEOUS | 3 refills | Status: DC

## 2021-03-08 MED ORDER — SOLIFENACIN SUCCINATE 5 MG PO TABS: 5.0000 mg | ORAL_TABLET | Freq: Every day | ORAL | 3 refills | Status: DC

## 2021-03-08 NOTE — Progress Notes (Signed)
    Procedures performed today:    None.  Independent interpretation of notes and tests performed by another provider:   None.  Brief History, Exam, Impression, and Recommendations:    Primary osteoarthritis of left hip X-ray confirmed left hip osteoarthritis, 70% better with meloxicam rehab exercises, return as needed for this.  Overactive bladder Pleasant 55 year old female with a chronic history of urinary incontinence described as the sudden urge to void occasionally resulting in the loss of urine, she has been on Macrobid in the past for frequent UTIs, most recent urine culture was 4 years ago and grew out minimally resistant Klebsiella with good sensitivity and a good MIC with levofloxacin. We added a 7-day course of Levaquin, I also added amitriptyline for her underlying overactive bladder symptoms, all overactive bladder symptoms have resolved however she is getting significant drowsiness, constipation, dry mouth. Due to the anticholinergic symptoms we will go ahead and discontinue amitriptyline and switch to Vesicare, if Vesicare either does not work causes excessive side effects we will use Myrbetriq. Avoiding oxybutynin for now because of the anticholinergic side effect profile.  Dermatitis Left elbow what appears to be solar dermatitis, adding topical triamcinolone. Return as needed for this.    ___________________________________________ Ihor Austin. Benjamin Stain, M.D., ABFM., CAQSM. Primary Care and Sports Medicine [facility]  Adjunct Instructor of Family Medicine  [employer]

## 2021-03-08 NOTE — Assessment & Plan Note (Signed)
Left elbow what appears to be solar dermatitis, adding topical triamcinolone. Return as needed for this.

## 2021-03-08 NOTE — Assessment & Plan Note (Signed)
Pleasant 55 year old female with a chronic history of urinary incontinence described as the sudden urge to void occasionally resulting in the loss of urine, she has been on Macrobid in the past for frequent UTIs, most recent urine culture was 4 years ago and grew out minimally resistant Klebsiella with good sensitivity and a good MIC with levofloxacin. We added a 7-day course of Levaquin, I also added amitriptyline for her underlying overactive bladder symptoms, all overactive bladder symptoms have resolved however she is getting significant drowsiness, constipation, dry mouth. Due to the anticholinergic symptoms we will go ahead and discontinue amitriptyline and switch to Vesicare, if Vesicare either does not work causes excessive side effects we will use Myrbetriq. Avoiding oxybutynin for now because of the anticholinergic side effect profile.

## 2021-03-08 NOTE — Assessment & Plan Note (Signed)
X-ray confirmed left hip osteoarthritis, 70% better with meloxicam rehab exercises, return as needed for this.

## 2021-05-09 ENCOUNTER — Other Ambulatory Visit: Payer: Self-pay

## 2021-05-09 ENCOUNTER — Emergency Department
Admission: RE | Admit: 2021-05-09 | Discharge: 2021-05-09 | Disposition: A | Discharge status: Home / Self Care | Payer: BC Managed Care – PPO | Source: Ambulatory Visit

## 2021-05-09 VITALS — BP 110/75 | HR 69 | Temp 99.0°F | Resp 16

## 2021-05-09 DIAGNOSIS — R1084 Generalized abdominal pain: Secondary | ICD-10-CM

## 2021-05-09 DIAGNOSIS — R110 Nausea: Secondary | ICD-10-CM | POA: Diagnosis not present

## 2021-05-09 MED ORDER — ONDANSETRON 8 MG PO TBDP
8.0000 mg | ORAL_TABLET | Freq: Three times a day (TID) | ORAL | 0 refills | Status: DC | PRN
Start: 2021-05-09 — End: 2021-08-09

## 2021-05-09 NOTE — ED Provider Notes (Signed)
[facility]    CSN: [id number] Arrival date & time: 05/09/21  1246      History   Chief Complaint Chief Complaint  Patient presents with   Abdominal Pain   Nausea    HPI Cheri Love Maguire is a 55 y.o. female.   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   UTI (urinary tract infection)     Patient Active Problem List   Diagnosis Date Noted   Dermatitis 03/08/2021   Primary osteoarthritis of left hip 02/17/2021   Overactive bladder 02/08/2021   Breast pain, right 03/31/2020   Preop examination 05/24/2019   Piezogenic pedal papule, 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   Primary osteoarthritis of left knee 09/03/2018   Radiculitis of left cervical region 07/16/2018   Acute frontal sinusitis 06/29/2018   Urine leukocytes 04/06/2018   History of acute pyelonephritis 04/06/2018   Pes anserine bursitis 01/18/2018   Rhinitis 11/28/2016   Skin tag 07/29/2016   Cystitis 04/15/2016   History of arthroplasty of right knee 05/08/2015   Plantar fasciitis, left 08/25/2014   Fatty liver disease, nonalcoholic 06/17/2014   Biliary colic 06/17/2014   History of kidney stones 05/20/2014   Hyperlipidemia LDL goal < 100 11/29/2012   Annual physical exam 07/17/2012   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   Obesity 07/17/2012   Essential hypertension, benign 07/17/2012   Right first  Metacarpophalangeal joint pain 04/30/2012    Past Surgical History:  Procedure Laterality Date   CESAREAN SECTION  1989; 1991   COLONOSCOPY  2016   [facility] with [facility] in [place]   CYSTOSCOPY W/ STONE MANIPULATION  09/2006   JOINT REPLACEMENT     THUMB FUSION Right 2017   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   Procedure: TOTAL KNEE ARTHROPLASTY;  Surgeon: Jodi Geralds, MD;  Location: [facility] OR;  Service: Orthopedics;  Laterality: Right;   TUBAL LIGATION  10/14/2011    OB History     Gravida  2   Para  2   Term  2   Preterm      AB      Living  2      SAB      IAB      Ectopic      Multiple      Live Births               Home Medications    Prior to Admission medications   Medication Sig Start Date End Date Taking? Authorizing Provider  aspirin 81 MG tablet Take 81 mg by mouth daily.   Yes [provider]  FIBER PO Take 2 capsules by mouth daily. Chewable   Yes [provider]  lisinopril-hydrochlorothiazide (ZESTORETIC) 20-25 MG tablet  TAKE 1 TABLET BY MOUTH EVERY DAY 01/15/21  Yes Monica Becton, MD  meloxicam (MOBIC) 15 MG tablet One tab PO qAM with a meal for 2 weeks, then daily prn pain. 02/17/21  Yes Monica Becton, MD  ondansetron (ZOFRAN ODT) 8 MG disintegrating tablet Take 1 tablet (8 mg total) by mouth every 8 (eight) hours as needed for nausea or vomiting. 05/09/21  Yes Trevor Iha, FNP  pantoprazole (PROTONIX) 40 MG tablet Take 1 tablet (40 mg total) by mouth daily. 02/08/21  Yes Monica Becton, MD  solifenacin (VESICARE) 5 MG tablet Take 1 tablet (5 mg total) by mouth daily. 03/08/21  Yes Monica Becton, MD  cetirizine (ZYRTEC) 10 MG tablet Take 10 mg by mouth once.    [provider]  triamcinolone cream (KENALOG) 0.5 % Apply 1 application topically 2 (two) times daily. To affected areas. 03/08/21   Monica Becton, MD     Family History Family History  Problem Relation Age of Onset   Hypertension Mother    Depression Mother    Colon polyps Mother    Atrial fibrillation Mother    Irritable bowel syndrome Mother    Barrett's esophagus Mother    Hypertension Father    Cancer Father        tongue/kidney and kidney removed    Diabetes Father    Breast cancer Sister        dx at the age 25   Colon cancer Neg Hx    Esophageal cancer Neg Hx    Stomach cancer Neg Hx    Rectal cancer Neg Hx     Social History Social History   Tobacco Use   Smoking status: Former    Packs/day: 0.10    Years: 2.00    Pack years: 0.20    Types: Cigarettes    Quit date: 09/27/1983    Years since quitting: 37.6   Smokeless tobacco: Never  Vaping Use   Vaping Use: Never used  Substance Use Topics   Alcohol use: No   Drug use: No     Allergies   Other   Review of Systems Review of Systems  Gastrointestinal:  Positive for abdominal pain and nausea.    Physical Exam Triage Vital Signs ED Triage Vitals  Enc Vitals Group     BP 05/09/21 1300 110/75     Pulse Rate 05/09/21 1300 69     Resp 05/09/21 1300 16     Temp 05/09/21 1300 99 F (37.2 C)     Temp Source 05/09/21 1300 Oral     SpO2 05/09/21 1300 97 %     Weight --      Height --      Head Circumference --      Peak Flow --      Pain Score 05/09/21 1258 7     Pain Loc --      Pain Edu? --      Excl. in GC? --    No data found.  Updated Vital Signs BP 110/75 (BP Location: Left Arm)   Pulse 69   Temp 99 F (37.2 C) (Oral)   Resp 16   LMP 09/29/2011 (LMP Unknown)   SpO2 97%    Physical Exam Vitals and nursing note reviewed.  Constitutional:      General: She is not in acute distress.    Appearance: Normal appearance. She is obese. She is not ill-appearing.  HENT:     Head:  Normocephalic and atraumatic.     Mouth/Throat:     Mouth: Mucous membranes are moist.     Pharynx: Oropharynx is clear.  Eyes:     Extraocular Movements:  Extraocular movements intact.     Conjunctiva/sclera: Conjunctivae normal.     Pupils: Pupils are equal, round, and reactive to light.  Cardiovascular:     Rate and Rhythm: Normal rate and regular rhythm.     Pulses: Normal pulses.     Heart sounds: Normal heart sounds.  Pulmonary:     Effort: Pulmonary effort is normal.     Breath sounds: Normal breath sounds.  Abdominal:     General: There is no distension.     Palpations: Abdomen is soft. There is no mass.     Tenderness: There is abdominal tenderness. There is no right CVA tenderness, left CVA tenderness, guarding or rebound.     Hernia: No hernia is present.     Comments: Hypoactive bowel sounds x4 quadrant, mildly TTP over 4 quadrants, no hepatosplenomegaly  Musculoskeletal:        General: Normal range of motion.     Cervical back: Normal range of motion and neck supple. No tenderness.  Lymphadenopathy:     Cervical: No cervical adenopathy.  Skin:    General: Skin is warm and dry.  Neurological:     General: No focal deficit present.     Mental Status: She is alert and oriented to person, place, and time. Mental status is at baseline.  Psychiatric:        Mood and Affect: Mood normal.        Behavior: Behavior normal.     UC Treatments / Results  Labs (all labs ordered are listed, but only abnormal results are displayed) Labs Reviewed - No data to display  EKG   Radiology No results found.  Procedures Procedures (including critical care time)  Medications Ordered in UC Medications - No data to display  Initial Impression / Assessment and Plan / UC Course  I have reviewed the triage vital signs and the nursing notes.  Pertinent labs & imaging results that were available during my care of the patient were reviewed by me and considered in my medical decision making (see chart for details).     MDM: 1.  Generalized abdominal pain-advised patient if symptoms worsen please to follow-up with PCP for further  evaluation and possible advanced imaging, patient advised to discontinue meloxicam now; 2.  Nausea-Rx'd Zofran.  Patient discharged home, hemodynamically stable. Final Clinical Impressions(s) / UC Diagnoses   Final diagnoses:  Generalized abdominal pain  Nausea     Discharge Instructions      Advised/instructed patient to discontinue meloxicam now, advised if abdominal pain continues to please follow-up PCP for further evaluation and possibly advanced imaging of abdomen.     ED Prescriptions     Medication Sig Dispense Auth. Provider   ondansetron (ZOFRAN ODT) 8 MG disintegrating tablet Take 1 tablet (8 mg total) by mouth every 8 (eight) hours as needed for nausea or vomiting. 24 tablet Trevor Iha, FNP      PDMP not reviewed this encounter.   Trevor Iha, FNP 05/09/21 1417

## 2021-05-09 NOTE — ED Triage Notes (Signed)
Patient presents to Urgent Care with complaints of nausea, upper abdominal pain, and headache since 1 month. Patient reports having nausea, upper abdominal pain and headache. She states she has been chewing Meloxicam for hip pain due to unable to swallow the pill. She did have some bowel changes earlier last week but has gone back to normal. She did take Protonix at 9am today has helped some.

## 2021-05-09 NOTE — Discharge Instructions (Addendum)
Advised/instructed patient to discontinue meloxicam now, advised if abdominal pain continues to please follow-up PCP for further evaluation and possibly advanced imaging of abdomen.

## 2021-05-17 ENCOUNTER — Ambulatory Visit (INDEPENDENT_AMBULATORY_CARE_PROVIDER_SITE_OTHER): Payer: BC Managed Care – PPO | Admitting: Sports Medicine

## 2021-05-17 ENCOUNTER — Other Ambulatory Visit: Payer: Self-pay

## 2021-05-17 VITALS — BP 104/64 | HR 78 | Temp 98.8°F

## 2021-05-17 DIAGNOSIS — Z23 Encounter for immunization: Secondary | ICD-10-CM | POA: Diagnosis not present

## 2021-05-17 NOTE — Progress Notes (Signed)
Pt here for Shingrix immunization #2.  Injection given in left deltoid.  Pt tolerated well.    Tiajuana Amass, CMA

## 2021-05-24 ENCOUNTER — Other Ambulatory Visit: Payer: Self-pay

## 2021-05-24 ENCOUNTER — Other Ambulatory Visit: Payer: Self-pay | Admitting: Family Medicine

## 2021-05-24 ENCOUNTER — Encounter: Payer: Self-pay | Admitting: Family Medicine

## 2021-05-24 ENCOUNTER — Ambulatory Visit: Payer: BC Managed Care – PPO | Admitting: Family Medicine

## 2021-05-24 VITALS — BP 127/75 | HR 83 | Resp 17 | Wt 220.7 lb

## 2021-05-24 DIAGNOSIS — R109 Unspecified abdominal pain: Secondary | ICD-10-CM | POA: Diagnosis not present

## 2021-05-24 DIAGNOSIS — K921 Melena: Secondary | ICD-10-CM | POA: Diagnosis not present

## 2021-05-24 DIAGNOSIS — Z1211 Encounter for screening for malignant neoplasm of colon: Secondary | ICD-10-CM | POA: Diagnosis not present

## 2021-05-24 LAB — HEMOCCULT GUIAC POC 1CARD (OFFICE): Fecal Occult Blood, POC: NEGATIVE

## 2021-05-24 MED ORDER — SUCRALFATE 1 GM/10ML PO SUSP: 1.0000 g | Freq: Three times a day (TID) | ORAL | 0 refills | Status: DC

## 2021-05-24 NOTE — Progress Notes (Signed)
Acute Office Visit  Subjective:    Patient ID: Wanda Valencia, female    DOB: [birth year], 55 y.o.   MRN: [id number]  Chief Complaint  Patient presents with   dark stool    HPI Patient is in today for abdominal concerns  Patient was started on Meloxicam a few months ago for arthritic pain. She began having some black stools and went to urgent care about two weeks ago. Reports nothing much was done, but she was told to stop chewing her pills (unable to swallow medication). Reports she has not taken the meloxicam at all since that appointment but continues to have a few days of black stool followed by a few days of normal stool. She is also having some intermittent cramping sensation across the middle of her abdomen that will last for a varying length of time, and recur multiple times per day. She has not noticed if the timing is related to meals at all.   She denies any bright red blood, diarrhea, fevers, ongoing abdominal pain, pain with palpation of abdomen, reflux, heartburn/indigestion, bladder changes, dysuria, etc, constipation, nausea, vomiting.   She is also asking if her [place] is putting her at higher risk for dementia. She is concerned because she has a family history of dementia.    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   UTI (urinary tract infection)     Past Surgical History:  Procedure Laterality Date   CESAREAN SECTION  1989; 1991    COLONOSCOPY  2016   [facility] with [facility] in [place]   CYSTOSCOPY W/ STONE MANIPULATION  09/2006   JOINT REPLACEMENT     THUMB FUSION Right 2017   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   Procedure: TOTAL KNEE ARTHROPLASTY;  Surgeon: Jodi Geralds, MD;  Location: [facility] OR;  Service: Orthopedics;  Laterality: Right;   TUBAL LIGATION  10/14/2011    Family History  Problem Relation Age of Onset   Hypertension Mother    Depression Mother    Colon polyps Mother    Atrial fibrillation Mother    Irritable bowel syndrome Mother    Barrett's esophagus Mother    Hypertension Father    Cancer Father        tongue/kidney and kidney removed    Diabetes Father    Breast cancer Sister        dx at the age 37   Colon cancer Neg Hx    Esophageal cancer Neg Hx    Stomach cancer Neg Hx    Rectal cancer Neg Hx     Social History   Socioeconomic History   Marital status: Married    Spouse name: Not on file   Number of children: 2   Years of education: Not on file   Highest  education level: Not on file  Occupational History   Occupation: retired  Tobacco Use   Smoking status: Former    Packs/day: 0.10    Years: 2.00    Pack years: 0.20    Types: Cigarettes    Quit date: 09/27/1983    Years since quitting: 37.6   Smokeless tobacco: Never  Vaping Use   Vaping Use: Never used  Substance and Sexual Activity   Alcohol use: No   Drug use: No   Sexual activity: Yes    Birth control/protection: Post-menopausal  Other Topics Concern   Not on file  Social History Narrative   Not on file   Social Determinants of Health   Financial Resource Strain: Not on file  Food Insecurity: Not on file  Transportation Needs: Not on file  Physical Activity: Not on file  Stress: Not on file  Social Connections: Not on file  Intimate Partner Violence: Not on file    Outpatient Medications Prior to Visit  Medication Sig Dispense Refill    aspirin 81 MG tablet Take 81 mg by mouth daily.     cetirizine (ZYRTEC) 10 MG tablet Take 10 mg by mouth once.     FIBER PO Take 2 capsules by mouth daily. Chewable     lisinopril-hydrochlorothiazide (ZESTORETIC) 20-25 MG tablet TAKE 1 TABLET BY MOUTH EVERY DAY 90 tablet 1   meloxicam (MOBIC) 15 MG tablet One tab PO qAM with a meal for 2 weeks, then daily prn pain. 30 tablet 3   ondansetron (ZOFRAN ODT) 8 MG disintegrating tablet Take 1 tablet (8 mg total) by mouth every 8 (eight) hours as needed for nausea or vomiting. 24 tablet 0   pantoprazole (PROTONIX) 40 MG tablet Take 1 tablet (40 mg total) by mouth daily. 90 tablet 3   solifenacin (VESICARE) 5 MG tablet Take 1 tablet (5 mg total) by mouth daily. 30 tablet 3   No facility-administered medications prior to visit.    Allergies  Allergen Reactions   Other     Pt does not do well with swallowing pills Other reaction(s): Other (See Comments) Runny nose and watery eyes.    Review of Systems All review of systems negative except what is listed in the HPI     Objective:    Physical Exam Vitals reviewed.  Constitutional:      Appearance: Normal appearance.  HENT:     Head: Normocephalic and atraumatic.  Cardiovascular:     Rate and Rhythm: Regular rhythm.     Heart sounds: Normal heart sounds.  Pulmonary:     Breath sounds: Normal breath sounds.  Abdominal:     General: Bowel sounds are normal. There is no distension.     Palpations: Abdomen is soft. There is no mass.     Tenderness: There is no abdominal tenderness. There is no guarding or rebound.  Skin:    Capillary Refill: Capillary refill takes less than 2 seconds.     Findings: No rash.  Neurological:     Mental Status: She is alert and oriented to person, place, and time. Mental status is at baseline.  Psychiatric:        Mood and Affect: Mood normal.        Behavior: Behavior normal.        Thought Content: Thought content normal.        Judgment: Judgment  normal.    BP 127/75   Pulse 83   Resp 17   Wt 220 lb  11.2 oz (100.1 kg)   LMP 09/29/2011 (LMP Unknown)   SpO2 96%   BMI 37.88 kg/m  Wt Readings from Last 3 Encounters:  05/24/21 220 lb 11.2 oz (100.1 kg)  02/08/21 224 lb (101.6 kg)  12/02/20 215 lb (97.5 kg)    Health Maintenance Due  Topic Date Due   COVID-19 Vaccine (1) Never done   Pneumococcal Vaccine 5-65 Years old (2 - PCV) 08/12/2009   INFLUENZA VACCINE  04/26/2021    There are no preventive care reminders to display for this patient.   Lab Results  Component Value Date   TSH 1.71 09/01/2020   Lab Results  Component Value Date   WBC 4.6 09/01/2020   HGB 14.0 09/01/2020   HCT 41.1 09/01/2020   MCV 91.3 09/01/2020   PLT 279 09/01/2020   Lab Results  Component Value Date   NA 141 09/01/2020   K 4.4 09/01/2020   CO2 30 09/01/2020   GLUCOSE 90 09/01/2020   BUN 19 09/01/2020   CREATININE 0.90 09/01/2020   BILITOT 0.4 09/01/2020   ALKPHOS 75 12/05/2015   AST 21 09/01/2020   ALT 18 09/01/2020   PROT 6.1 09/01/2020   ALBUMIN 4.1 12/05/2015   CALCIUM 9.4 09/01/2020   ANIONGAP 8 04/23/2018   Lab Results  Component Value Date   CHOL 154 09/01/2020   Lab Results  Component Value Date   HDL 42 (L) 09/01/2020   Lab Results  Component Value Date   LDLCALC 92 09/01/2020   Lab Results  Component Value Date   TRIG 102 09/01/2020   Lab Results  Component Value Date   CHOLHDL 3.7 09/01/2020   Lab Results  Component Value Date   HGBA1C 5.7 (H) 09/01/2020       Assessment & Plan:   1. Encounter for Hemoccult screening 2. Black stool 3. Abdominal discomfort Occult stool negative in office today. Concerning for ulcer d/t recent initiation of NSAIDs in correlation to her symptoms. Recommend she continue holding off on all NSAIDs and alcohol. Continue protonix as prescribed. Adding carafate to take before meals. Additionally, no colonoscopy on file here - she cannot remember how long ago it was, but  thinks she is still within her time frame. She is agreeable for me to go ahead and place a GI referral to update the colonoscopy since she is now symptomatic. Encourage her to keep track of her symptoms, timing, relation to meals, etc. She can switch to Tylenol Arthritis for pain management and consider joint injections with PCP if needed. Patient aware of signs/symptoms requiring further/urgent evaluation.  Asked PCP about correlation of Vesicare with dementia - would need to further look into this but no significant reason to stop taking at this time if it is significantly helping her symptoms. Can reassess in the future.   - Ambulatory referral to Gastroenterology - sucralfate (CARAFATE) 1 GM/10ML suspension; Take 10 mLs (1 g total) by mouth 4 (four) times daily -  with meals and at bedtime.  Dispense: 420 mL; Refill: 0 - POCT occult blood stool  Follow-up with PCP in about 3-4 weeks or sooner if needed.   Lollie Marrow Reola Calkins, DNP, FNP-C

## 2021-05-24 NOTE — Patient Instructions (Signed)
Avoid NSAIDs (meloxicam, ibuprofen, Aleve, etc) and alcohol. Try Tylenol Arthritis for pain and follow-up with Dr. Karie Schwalbe to consider injections GI referral placed for colonoscopy Adding Carafate (take 1 hour before meals) to help possible ulcer.

## 2021-05-25 MED ORDER — SUCRALFATE 1 G PO TABS: 1.0000 g | ORAL_TABLET | Freq: Four times a day (QID) | ORAL | 0 refills | Status: DC

## 2021-06-22 ENCOUNTER — Other Ambulatory Visit: Payer: Self-pay | Admitting: Sports Medicine

## 2021-06-22 DIAGNOSIS — Z1231 Encounter for screening mammogram for malignant neoplasm of breast: Secondary | ICD-10-CM

## 2021-07-21 ENCOUNTER — Other Ambulatory Visit: Payer: Self-pay | Admitting: Sports Medicine

## 2021-08-09 ENCOUNTER — Other Ambulatory Visit: Payer: Self-pay

## 2021-08-09 ENCOUNTER — Ambulatory Visit: Payer: BC Managed Care – PPO | Admitting: Sports Medicine

## 2021-08-09 VITALS — BP 110/76 | HR 70 | Wt 224.1 lb

## 2021-08-09 DIAGNOSIS — R109 Unspecified abdominal pain: Secondary | ICD-10-CM | POA: Insufficient documentation

## 2021-08-09 LAB — POCT URINALYSIS DIP (CLINITEK)
Bilirubin, UA: NEGATIVE
Blood, UA: NEGATIVE
Glucose, UA: NEGATIVE mg/dL
Ketones, POC UA: NEGATIVE mg/dL
Nitrite, UA: NEGATIVE
POC PROTEIN,UA: NEGATIVE
Spec Grav, UA: 1.025 (ref 1.010–1.025)
Urobilinogen, UA: 0.2 E.U./dL
pH, UA: 6.5 (ref 5.0–8.0)

## 2021-08-09 MED ORDER — NITROFURANTOIN MONOHYD MACRO 100 MG PO CAPS: 100.0000 mg | ORAL_CAPSULE | Freq: Two times a day (BID) | ORAL | 0 refills | Status: DC

## 2021-08-09 MED ORDER — PHENAZOPYRIDINE HCL 200 MG PO TABS: 200.0000 mg | ORAL_TABLET | Freq: Three times a day (TID) | ORAL | 0 refills | Status: AC

## 2021-08-09 NOTE — Assessment & Plan Note (Addendum)
This is a pleasant 54 year old female, she went through the weekend acting and eating as is typical for her, on Saturday she developed left lower quadrant abdominal pain, no other urinary symptoms, stools potentially a little bit looser but no blood, no melena, no hematochezia. No fevers, chills, she did have a bit of a runny nose. I did internally rotate her left hip, this reproduced pain but it was nonconcordant. Her exam is benign, belly is completely soft, no tenderness, no rebound tenderness, guarding, rigidity. Previous urine culture has grown out relatively pansensitive Klebsiella pneumoniae. Urinalysis was positive for leukocytes, adding Macrobid, Pyridium, with close follow-up in a week or 2 for additional evaluation and likely imaging for consideration of diverticulitis if insufficient improvement.

## 2021-08-09 NOTE — Addendum Note (Signed)
Addended by: Gaynelle Arabian on: 08/09/2021 04:29 PM   Modules accepted: Orders

## 2021-08-09 NOTE — Progress Notes (Addendum)
    Procedures performed today:    None.  Independent interpretation of notes and tests performed by another provider:   None.  Brief History, Exam, Impression, and Recommendations:    Abdominal pain in female This is a pleasant 55 year old female, she went through the weekend acting and eating as is typical for her, on Saturday she developed left lower quadrant abdominal pain, no other urinary symptoms, stools potentially a little bit looser but no blood, no melena, no hematochezia. No fevers, chills, she did have a bit of a runny nose. I did internally rotate her left hip, this reproduced pain but it was nonconcordant. Her exam is benign, belly is completely soft, no tenderness, no rebound tenderness, guarding, rigidity. Previous urine culture has grown out relatively pansensitive Klebsiella pneumoniae. Urinalysis was positive for leukocytes, adding Macrobid, Pyridium, with close follow-up in a week or 2 for additional evaluation and likely imaging for consideration of diverticulitis if insufficient improvement.    ___________________________________________ Ihor Austin. Benjamin Stain, M.D., ABFM., CAQSM. Primary Care and Sports Medicine [facility]  Adjunct Instructor of Family Medicine  [employer]

## 2021-08-11 ENCOUNTER — Ambulatory Visit (INDEPENDENT_AMBULATORY_CARE_PROVIDER_SITE_OTHER): Payer: BC Managed Care – PPO

## 2021-08-11 ENCOUNTER — Other Ambulatory Visit: Payer: Self-pay

## 2021-08-11 DIAGNOSIS — Z1231 Encounter for screening mammogram for malignant neoplasm of breast: Secondary | ICD-10-CM

## 2021-08-11 LAB — URINE CULTURE
MICRO NUMBER:: 12637166
SPECIMEN QUALITY:: ADEQUATE

## 2021-08-17 ENCOUNTER — Other Ambulatory Visit: Payer: Self-pay | Admitting: Sports Medicine

## 2021-08-17 DIAGNOSIS — M25552 Pain in left hip: Secondary | ICD-10-CM

## 2021-09-24 ENCOUNTER — Other Ambulatory Visit: Payer: Self-pay

## 2021-09-24 ENCOUNTER — Ambulatory Visit: Payer: BC Managed Care – PPO | Admitting: Sports Medicine

## 2021-09-24 ENCOUNTER — Ambulatory Visit (INDEPENDENT_AMBULATORY_CARE_PROVIDER_SITE_OTHER): Payer: BC Managed Care – PPO

## 2021-09-24 DIAGNOSIS — M5030 Other cervical disc degeneration, unspecified cervical region: Secondary | ICD-10-CM

## 2021-09-24 NOTE — Progress Notes (Signed)
° ° °  Procedures performed today:    None.  Independent interpretation of notes and tests performed by another provider:   None.  Brief History, Exam, Impression, and Recommendations:    DDD (degenerative disc disease), cervical This is a pleasant 55 year old female, we have treated her several years ago for periscapular distribution radicular symptoms, more recently she has been developing occasional shooting pains into the right upper chest, not associated with exertion, nonpositional, no trauma. She has had breast ultrasounds in the mammograms that were clear. Pain is not pleuritic, nothing down the arm. On exam with a female chaperone the breast is completely unremarkable, no areas of tenderness, no masses, tail of Spence completely unremarkable. No areas of palpation along the right rib cage, costochondral junctions, shoulder exam is completely unremarkable with good motion, strength, negative impingement, labral, bicipital, acromioclavicular signs. On further questioning she does have continued pain in her neck with tightness, adding updated cervical spine x-rays, cervical spine conditioning for the next 6 weeks followed by a cervical spine MRI if not sufficiently better.    ___________________________________________ Ihor Austin. Benjamin Stain, M.D., ABFM., CAQSM. Primary Care and Sports Medicine [facility]  Adjunct Instructor of Family Medicine  [employer]

## 2021-09-24 NOTE — Assessment & Plan Note (Signed)
This is a pleasant 55 year old female, we have treated her several years ago for periscapular distribution radicular symptoms, more recently she has been developing occasional shooting pains into the right upper chest, not associated with exertion, nonpositional, no trauma. She has had breast ultrasounds in the mammograms that were clear. Pain is not pleuritic, nothing down the arm. On exam with a female chaperone the breast is completely unremarkable, no areas of tenderness, no masses, tail of Spence completely unremarkable. No areas of palpation along the right rib cage, costochondral junctions, shoulder exam is completely unremarkable with good motion, strength, negative impingement, labral, bicipital, acromioclavicular signs. On further questioning she does have continued pain in her neck with tightness, adding updated cervical spine x-rays, cervical spine conditioning for the next 6 weeks followed by a cervical spine MRI if not sufficiently better.

## 2021-11-09 ENCOUNTER — Other Ambulatory Visit: Payer: Self-pay

## 2021-11-09 ENCOUNTER — Ambulatory Visit: Payer: BC Managed Care – PPO | Admitting: Sports Medicine

## 2021-11-09 DIAGNOSIS — M5030 Other cervical disc degeneration, unspecified cervical region: Secondary | ICD-10-CM

## 2021-11-09 DIAGNOSIS — M7701 Medial epicondylitis, right elbow: Secondary | ICD-10-CM

## 2021-11-09 NOTE — Progress Notes (Signed)
° ° °  Procedures performed today:    None.  Independent interpretation of notes and tests performed by another provider:   None.  Brief History, Exam, Impression, and Recommendations:    DDD (degenerative disc disease), cervical Pleasant 56 year old female, we treated her last time for right periscapular and right upper chest cervical plexus radiculitis, she responded well to conservative treatment, symptoms have all resolved.  Medial epicondylitis, right Unfortunately she is starting to have increasing pain right elbow, she is playing a lot of video games on her phone, the pain is worse with gripping and flexion of her thumb. On exam she has tenderness at the common flexor tendon origin, this is classic golfers elbow. Adding an elbow sleeve, home conditioning, return to see me in 6 weeks, injection if no better.    ___________________________________________ Gwen Her. Dianah Field, M.D., ABFM., CAQSM. Primary Care and [place] Instructor of [employer]

## 2021-11-09 NOTE — Assessment & Plan Note (Signed)
Unfortunately she is starting to have increasing pain right elbow, she is playing a lot of video games on her phone, the pain is worse with gripping and flexion of her thumb. On exam she has tenderness at the common flexor tendon origin, this is classic golfers elbow. Adding an elbow sleeve, home conditioning, return to see me in 6 weeks, injection if no better.

## 2021-11-09 NOTE — Assessment & Plan Note (Signed)
Pleasant 56 year old female, we treated her last time for right periscapular and right upper chest cervical plexus radiculitis, she responded well to conservative treatment, symptoms have all resolved.

## 2022-01-01 ENCOUNTER — Other Ambulatory Visit: Payer: Self-pay | Admitting: Sports Medicine

## 2022-01-01 DIAGNOSIS — M25552 Pain in left hip: Secondary | ICD-10-CM

## 2022-01-01 DIAGNOSIS — N3281 Overactive bladder: Secondary | ICD-10-CM

## 2022-04-20 ENCOUNTER — Ambulatory Visit: Payer: BC Managed Care – PPO | Admitting: Sports Medicine

## 2022-04-20 DIAGNOSIS — I10 Essential (primary) hypertension: Secondary | ICD-10-CM

## 2022-04-20 DIAGNOSIS — M5030 Other cervical disc degeneration, unspecified cervical region: Secondary | ICD-10-CM

## 2022-04-20 MED ORDER — VALSARTAN-HYDROCHLOROTHIAZIDE 160-25 MG PO TABS: 1.0000 | ORAL_TABLET | Freq: Every day | ORAL | 3 refills | Status: DC

## 2022-04-20 NOTE — Assessment & Plan Note (Signed)
Pleasant 56 year old female, known cervical degenerative disc disease, radiculitis, historically she has had right-sided periscapular and right upper chest radicular symptoms, responded well to conservative treatment including steroids, now having recurrence of discomfort at this time both arms, forearms, hands and wrists, with weakness and dropping things. She has a negative Tinel's and Phalen signs and symptoms are not worse at night. Differential still includes carpal tunnel syndrome versus cervical radiculopathy. I would like a nerve conduction/EMG of both upper extremities, I like to see her back to go over results. Adding some carpal tunnel stretches in the meantime

## 2022-04-20 NOTE — Assessment & Plan Note (Signed)
Currently on high dose of lisinopril/HCTZ, complaining of leg swelling. Would like to switch, switching to valsartan/HCTZ 160/25. Return in 2 weeks for nurse visit blood pressure check.

## 2022-04-20 NOTE — Progress Notes (Signed)
    Procedures performed today:    None.  Independent interpretation of notes and tests performed by another provider:   None.  Brief History, Exam, Impression, and Recommendations:    DDD (degenerative disc disease), cervical Pleasant 56 year old female, known cervical degenerative disc disease, radiculitis, historically she has had right-sided periscapular and right upper chest radicular symptoms, responded well to conservative treatment including steroids, now having recurrence of discomfort at this time both arms, forearms, hands and wrists, with weakness and dropping things. She has a negative Tinel's and Phalen signs and symptoms are not worse at night. Differential still includes carpal tunnel syndrome versus cervical radiculopathy. I would like a nerve conduction/EMG of both upper extremities, I like to see her back to go over results. Adding some carpal tunnel stretches in the meantime  Essential hypertension, benign Currently on high dose of lisinopril/HCTZ, complaining of leg swelling. Would like to switch, switching to valsartan/HCTZ 160/25. Return in 2 weeks for nurse visit blood pressure check.    ____________________________________________ Ihor Austin. Benjamin Stain, M.D., ABFM., CAQSM., AME. Primary Care and Sports Medicine [facility]  Adjunct Professor of Family Medicine  [employer]  Restaurant manager, fast food

## 2022-05-04 ENCOUNTER — Ambulatory Visit: Payer: BC Managed Care – PPO

## 2022-05-11 ENCOUNTER — Ambulatory Visit: Payer: BC Managed Care – PPO

## 2022-05-11 ENCOUNTER — Other Ambulatory Visit: Payer: Self-pay | Admitting: Sports Medicine

## 2022-05-11 DIAGNOSIS — N3281 Overactive bladder: Secondary | ICD-10-CM

## 2022-05-12 ENCOUNTER — Ambulatory Visit (INDEPENDENT_AMBULATORY_CARE_PROVIDER_SITE_OTHER): Payer: BC Managed Care – PPO | Admitting: Family Medicine

## 2022-05-12 VITALS — BP 105/62 | HR 82

## 2022-05-12 DIAGNOSIS — I10 Essential (primary) hypertension: Secondary | ICD-10-CM

## 2022-05-12 NOTE — Progress Notes (Signed)
   Established Patient Office Visit  Subjective   Patient ID: Wanda Valencia, female    DOB: [birth year]  Age: 56 y.o. MRN: [id number]  Chief Complaint  Patient presents with   Hypertension    HPI  Wanda Valencia is here for blood pressure check. She reports her home readings have been around 130/78. Denies chest pain, shortness of breath or dizziness.     ROS    Objective:     BP 105/62   Pulse 82   LMP 09/29/2011 (LMP Unknown)   SpO2 97%    Physical Exam   No results found for any visits on 05/12/22.    The 10-year ASCVD risk score (Arnett DK, et al., 2019) is: 2.4%    Assessment & Plan:  Hypertension - Patient advised to continue with current medication as directed. Follow up in 1 month.   Problem List Items Addressed This Visit       Unprioritized   Essential hypertension, benign - Primary    Return in about 1 month (around 06/12/2022) for HTN with Dr Benjamin Stain. .    Earna Coder, Janalyn Harder, CMA

## 2022-05-12 NOTE — Progress Notes (Signed)
Pressure is actually little bit on the low side with the recent change of medication but she reports blood pressures more in the 130s at home.  So we will continue to monitor.  If she is noticing more low blood pressures then we may need to adjust her dose slightly.

## 2022-05-25 ENCOUNTER — Encounter: Payer: BC Managed Care – PPO | Admitting: Sports Medicine

## 2022-06-01 ENCOUNTER — Ambulatory Visit: Payer: BC Managed Care – PPO | Admitting: Sports Medicine

## 2022-06-03 ENCOUNTER — Encounter: Payer: BC Managed Care – PPO | Admitting: Sports Medicine

## 2022-06-10 ENCOUNTER — Telehealth: Payer: Self-pay

## 2022-06-10 NOTE — Telephone Encounter (Signed)
Patient called to report that she was charged an OV charge for a BP ck in August. She does not feel like this is correct and would like someone to check into it.

## 2022-06-15 ENCOUNTER — Telehealth: Payer: Self-pay

## 2022-06-15 NOTE — Telephone Encounter (Signed)
[place] called and states the office visit for 05/12/2022 was billed wrong. 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  It was billed as a level 1/nurse visit. We never know which insurance will charge the co-pay for nurse visits .

## 2022-06-28 ENCOUNTER — Other Ambulatory Visit: Payer: Self-pay | Admitting: Sports Medicine

## 2022-06-28 DIAGNOSIS — Z1231 Encounter for screening mammogram for malignant neoplasm of breast: Secondary | ICD-10-CM

## 2022-06-30 ENCOUNTER — Other Ambulatory Visit: Payer: Self-pay | Admitting: Sports Medicine

## 2022-06-30 DIAGNOSIS — N3281 Overactive bladder: Secondary | ICD-10-CM

## 2022-07-01 ENCOUNTER — Ambulatory Visit (INDEPENDENT_AMBULATORY_CARE_PROVIDER_SITE_OTHER): Payer: BC Managed Care – PPO | Admitting: Sports Medicine

## 2022-07-01 VITALS — BP 127/80 | HR 85 | Ht 64.0 in | Wt 222.0 lb

## 2022-07-01 DIAGNOSIS — F4321 Adjustment disorder with depressed mood: Secondary | ICD-10-CM | POA: Diagnosis not present

## 2022-07-01 DIAGNOSIS — I10 Essential (primary) hypertension: Secondary | ICD-10-CM | POA: Diagnosis not present

## 2022-07-01 DIAGNOSIS — Z0000 Encounter for general adult medical examination without abnormal findings: Secondary | ICD-10-CM

## 2022-07-01 DIAGNOSIS — R21 Rash and other nonspecific skin eruption: Secondary | ICD-10-CM | POA: Diagnosis not present

## 2022-07-01 DIAGNOSIS — E785 Hyperlipidemia, unspecified: Secondary | ICD-10-CM

## 2022-07-01 MED ORDER — ALPRAZOLAM 0.25 MG PO TABS: 0.2500 mg | ORAL_TABLET | Freq: Two times a day (BID) | ORAL | 0 refills | Status: DC | PRN

## 2022-07-01 NOTE — Assessment & Plan Note (Signed)
Reddish rash over the nasal bridge and over the cheeks, does not appear to be the classic butterfly rash of lupus, seborrheic dermatitis, rosacea and sunburn are higher on the differential. She also has a small nodule on the left tip of the nose, she will leave this alone for the next month and if still present we will consider cryotherapy. The facial rash does not bother her so we will hold off on treating with steroid or topical metronidazole.

## 2022-07-01 NOTE — Assessment & Plan Note (Signed)
Mother recently passed, having the expected grieving, we did discuss grief counseling, medication, symptoms are not constant so we will treat intermittently with alprazolam 0.25 mg 1-2 times daily as needed, she declines a daily controller medication such as Zoloft. We will see how things go with this and can titrate dose if needed.

## 2022-07-01 NOTE — Progress Notes (Addendum)
Subjective:    CC: Annual Physical Exam  HPI:  This patient is here for their annual physical  I reviewed the past medical history, family history, social history, surgical history, and allergies today and no changes were needed.  Please see the problem list section below in epic for further details.  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   UTI (urinary tract infection)    Past Surgical History: Past Surgical History:  Procedure Laterality Date   [place]; 1991   COLONOSCOPY  2016   Digestive Health Specialist with [facility] in [place]  09/2006   JOINT REPLACEMENT     THUMB FUSION Right 2017   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   Procedure: TOTAL KNEE ARTHROPLASTY;  Surgeon: Dorna Leitz, MD;  Location: [place];  Service: Orthopedics;  Laterality: Right;   TUBAL LIGATION  10/14/2011   Social History: Social History   Socioeconomic History   Marital status: Married    Spouse name: Not on file   Number of children: 2   Years of education: Not on file   Highest education level: Not on file  Occupational History   Occupation: retired  Tobacco Use   Smoking status: Former    Packs/day: 0.10    Years: 2.00    Total pack years: 0.20    Types: Cigarettes    Quit date: 09/27/1983    Years since quitting: 38.7   Smokeless tobacco: Never   Vaping Use   Vaping Use: Never used  Substance and Sexual Activity   Alcohol use: No   Drug use: No   Sexual activity: Yes    Birth control/protection: Post-menopausal  Other Topics Concern   Not on file  Social History Narrative   Not on file   Social Determinants of Health   Financial Resource Strain: Not on file  Food Insecurity: Not on file  Transportation Needs: Not on file  Physical Activity: Not on file  Stress: Not on file  Social Connections: Not on file   Family History: Family History  Problem Relation Age of Onset   Hypertension Mother    Depression Mother    Colon polyps Mother    Atrial fibrillation Mother    Irritable bowel syndrome Mother    Barrett's esophagus Mother    Hypertension Father    Cancer Father        tongue/kidney and kidney removed    Diabetes Father    Breast cancer Sister        dx at the age 45   Colon cancer Neg Hx    Esophageal cancer Neg Hx    Stomach cancer Neg Hx    Rectal cancer Neg Hx    Allergies: Allergies  Allergen Reactions  Other     Pt does not do well with swallowing pills Other reaction(s): Other (See Comments) Runny nose and watery eyes.   Medications: See med rec.  Review of Systems: No headache, visual changes, nausea, vomiting, diarrhea, constipation, dizziness, abdominal pain, skin rash, fevers, chills, night sweats, swollen lymph nodes, weight loss, chest pain, body aches, joint swelling, muscle aches, shortness of breath, mood changes, visual or auditory hallucinations.  Objective:    General: Well Developed, well nourished, and in no acute distress.  Neuro: Alert and oriented x3, extra-ocular muscles intact, sensation grossly intact. Cranial nerves II through XII are intact, motor, sensory, and coordinative functions are all intact. HEENT: Normocephalic, atraumatic, pupils equal round reactive to light, neck supple, no masses, no lymphadenopathy, thyroid nonpalpable. Oropharynx, nasopharynx, external  ear canals are unremarkable. Skin: Warm and dry, no rashes noted.  Cardiac: Regular rate and rhythm, no murmurs rubs or gallops.  Respiratory: Clear to auscultation bilaterally. Not using accessory muscles, speaking in full sentences.  Abdominal: Soft, nontender, nondistended, positive bowel sounds, no masses, no organomegaly.  Musculoskeletal: Shoulder, elbow, wrist, hip, knee, ankle stable, and with full range of motion.  Impression and Recommendations:    The patient was counselled, risk factors were discussed, anticipatory guidance given.  Annual physical exam Annual physical as above, declined flu shot, we will be trying to get her colonoscopy report from digestive health specialist. She is up-to-date on everything else. Checking routine labs today.  Facial rash Reddish rash over the nasal bridge and over the cheeks, does not appear to be the classic butterfly rash of lupus, seborrheic dermatitis, rosacea and sunburn are higher on the differential. She also has a small nodule on the left tip of the nose, she will leave this alone for the next month and if still present we will consider cryotherapy. The facial rash does not bother her so we will hold off on treating with steroid or topical metronidazole.  Grieving Mother recently passed, having the expected grieving, we did discuss grief counseling, medication, symptoms are not constant so we will treat intermittently with alprazolam 0.25 mg 1-2 times daily as needed, she declines a daily controller medication such as Zoloft. We will see how things go with this and can titrate dose if needed.  Hyperlipidemia with target low density lipoprotein (LDL) cholesterol less than 100 mg/dL Are elevated, patient will work on lower cholesterol diet and we can recheck sometime early 2024.   ____________________________________________ Ihor Austin. Benjamin Stain, M.D., ABFM., CAQSM., AME. Primary Care and Sports Medicine [facility]  [facility]  Adjunct Professor of Family Medicine  [place] [employer]  Restaurant manager, fast food

## 2022-07-01 NOTE — Assessment & Plan Note (Signed)
Annual physical as above, declined flu shot, we will be trying to get her colonoscopy report from digestive health specialist. She is up-to-date on everything else. Checking routine labs today.

## 2022-07-02 LAB — TSH: TSH: 1.76 mIU/L (ref 0.40–4.50)

## 2022-07-02 LAB — CBC
HCT: 42.5 % (ref 35.0–45.0)
Hemoglobin: 14.3 g/dL (ref 11.7–15.5)
MCH: 31.4 pg (ref 27.0–33.0)
MCHC: 33.6 g/dL (ref 32.0–36.0)
MCV: 93.2 fL (ref 80.0–100.0)
MPV: 11.3 fL (ref 7.5–12.5)
Platelets: 325 10*3/uL (ref 140–400)
RBC: 4.56 10*6/uL (ref 3.80–5.10)
RDW: 12.5 % (ref 11.0–15.0)
WBC: 4.7 10*3/uL (ref 3.8–10.8)

## 2022-07-02 LAB — COMPLETE METABOLIC PANEL WITH GFR
AG Ratio: 1.9 (calc) (ref 1.0–2.5)
ALT: 32 U/L — ABNORMAL HIGH (ref 6–29)
AST: 34 U/L (ref 10–35)
Albumin: 4.6 g/dL (ref 3.6–5.1)
Alkaline phosphatase (APISO): 82 U/L (ref 37–153)
BUN/Creatinine Ratio: 16 (calc) (ref 6–22)
BUN: 17 mg/dL (ref 7–25)
CO2: 29 mmol/L (ref 20–32)
Calcium: 10.2 mg/dL (ref 8.6–10.4)
Chloride: 104 mmol/L (ref 98–110)
Creat: 1.05 mg/dL — ABNORMAL HIGH (ref 0.50–1.03)
Globulin: 2.4 g/dL (calc) (ref 1.9–3.7)
Glucose, Bld: 92 mg/dL (ref 65–99)
Potassium: 4.5 mmol/L (ref 3.5–5.3)
Sodium: 141 mmol/L (ref 135–146)
Total Bilirubin: 0.6 mg/dL (ref 0.2–1.2)
Total Protein: 7 g/dL (ref 6.1–8.1)
eGFR: 62 mL/min/{1.73_m2} (ref >=60)

## 2022-07-02 LAB — HEMOGLOBIN A1C
Hgb A1c MFr Bld: 5.7 % of total Hgb — ABNORMAL HIGH (ref <5.7)
Mean Plasma Glucose: 117 mg/dL
eAG (mmol/L): 6.5 mmol/L

## 2022-07-02 LAB — LIPID PANEL
Cholesterol: 207 mg/dL — ABNORMAL HIGH (ref <200)
HDL: 42 mg/dL — ABNORMAL LOW (ref >=50)
LDL Cholesterol (Calc): 134 mg/dL (calc) — ABNORMAL HIGH
Non-HDL Cholesterol (Calc): 165 mg/dL (calc) — ABNORMAL HIGH (ref <130)
Total CHOL/HDL Ratio: 4.9 (calc) (ref <5.0)
Triglycerides: 169 mg/dL — ABNORMAL HIGH (ref <150)

## 2022-07-04 NOTE — Assessment & Plan Note (Signed)
Are elevated, patient will work on lower cholesterol diet and we can recheck sometime early 2024.

## 2022-08-19 ENCOUNTER — Encounter: Payer: Self-pay | Admitting: Sports Medicine

## 2022-08-19 DIAGNOSIS — I10 Essential (primary) hypertension: Secondary | ICD-10-CM

## 2022-08-22 MED ORDER — VALSARTAN-HYDROCHLOROTHIAZIDE 160-25 MG PO TABS: 1.0000 | ORAL_TABLET | Freq: Every day | ORAL | 3 refills | Status: DC

## 2022-08-24 ENCOUNTER — Ambulatory Visit (INDEPENDENT_AMBULATORY_CARE_PROVIDER_SITE_OTHER): Payer: BC Managed Care – PPO

## 2022-08-24 DIAGNOSIS — Z1231 Encounter for screening mammogram for malignant neoplasm of breast: Secondary | ICD-10-CM | POA: Diagnosis not present

## 2022-08-29 ENCOUNTER — Encounter: Payer: Self-pay | Admitting: Sports Medicine

## 2022-09-02 ENCOUNTER — Telehealth: Payer: Self-pay

## 2022-09-02 NOTE — Telephone Encounter (Signed)
Transition Care Management Unsuccessful Follow-up Telephone Call  Date of discharge and from where:  [facility]  Attempts:  1st Attempt  Reason for unsuccessful TCM follow-up call:  No answer/busy Karena Addison, LPN [facility] Nurse Health Advisor Direct Dial [phone]

## 2022-09-06 ENCOUNTER — Ambulatory Visit: Payer: BC Managed Care – PPO | Admitting: Family Medicine

## 2022-09-06 ENCOUNTER — Encounter: Payer: Self-pay | Admitting: Family Medicine

## 2022-09-06 VITALS — BP 109/69 | HR 102 | Ht 64.0 in | Wt 222.0 lb

## 2022-09-06 DIAGNOSIS — G8929 Other chronic pain: Secondary | ICD-10-CM | POA: Diagnosis not present

## 2022-09-06 DIAGNOSIS — G44019 Episodic cluster headache, not intractable: Secondary | ICD-10-CM | POA: Diagnosis not present

## 2022-09-06 DIAGNOSIS — R519 Headache, unspecified: Secondary | ICD-10-CM | POA: Diagnosis not present

## 2022-09-06 NOTE — Progress Notes (Unsigned)
   Acute Office Visit  Subjective:     Patient ID: Wanda Valencia, female    DOB: Apr 17, 1966, 56 y.o.   MRN: [id number]  Chief Complaint  Patient presents with   Eye Pain    Right eye pain    HPI Patient is in today for having pain above right eye.she describes it as a sharp intermittent pain that she says last seconds. Rates 9/10.  Says her prior kidney stones were a 10/10.  Initially presented to the ED on 12/7 for foreign body sensation in the eye followed by a sharp stabbing pain after she had tried to use some saline drops.  They thought they saw an abrasion and so referred her to the ophthalmologist.  Trustingly, she was also experiencing a cough and upper respiratory symptoms concomitantly.  Followed up with ophthalmology the following day on December 8.   History of hypertension.  ROS      Objective:    BP 109/69 (BP Location: Left Arm, Patient Position: Sitting, Cuff Size: Large)   Pulse (!) 102   Ht 5\' 4"  (1.626 m)   Wt 222 lb 0.6 oz (100.7 kg)   LMP 09/29/2011 (LMP Unknown)   SpO2 95%   BMI 38.11 kg/m  {Vitals History (Optional):23777}  Physical Exam Constitutional:      Appearance: She is well-developed.  HENT:     Head: Normocephalic and atraumatic.     Right Ear: Tympanic membrane, ear canal and external ear normal.     Left Ear: Tympanic membrane, ear canal and external ear normal.     Nose: Nose normal.     Mouth/Throat:     Pharynx: Oropharynx is clear.  Eyes:     Conjunctiva/sclera: Conjunctivae normal.     Pupils: Pupils are equal, round, and reactive to light.  Neck:     Thyroid: No thyromegaly.  Cardiovascular:     Rate and Rhythm: Normal rate and regular rhythm.     Heart sounds: Normal heart sounds.  Pulmonary:     Effort: Pulmonary effort is normal.     Breath sounds: Normal breath sounds. No wheezing.  Musculoskeletal:     Cervical back: Neck supple.  Lymphadenopathy:     Cervical: No cervical adenopathy.  Skin:    General: Skin  is warm and dry.  Neurological:     Mental Status: She is alert and oriented to person, place, and time.     No results found for any visits on 09/06/22.      Assessment & Plan:   Problem List Items Addressed This Visit   None Visit Diagnoses     Chronic right-sided headache    -  Primary   Episodic cluster headache, not intractable       Relevant Orders   MR Brain W Wo Contrast       No orders of the defined types were placed in this encounter.   No follow-ups on file.  M, MD

## 2022-09-15 ENCOUNTER — Encounter: Payer: Self-pay | Admitting: Family Medicine

## 2022-09-15 NOTE — Telephone Encounter (Signed)
Called patient. The MRI has not been resulted.

## 2023-01-01 ENCOUNTER — Other Ambulatory Visit: Payer: Self-pay | Admitting: Sports Medicine

## 2023-07-10 ENCOUNTER — Other Ambulatory Visit: Payer: Self-pay | Admitting: Sports Medicine

## 2023-07-10 DIAGNOSIS — N3281 Overactive bladder: Secondary | ICD-10-CM

## 2023-07-11 ENCOUNTER — Other Ambulatory Visit: Payer: Self-pay | Admitting: Sports Medicine

## 2023-07-11 DIAGNOSIS — Z1231 Encounter for screening mammogram for malignant neoplasm of breast: Secondary | ICD-10-CM

## 2023-08-18 ENCOUNTER — Other Ambulatory Visit: Payer: Self-pay | Admitting: Sports Medicine

## 2023-08-18 DIAGNOSIS — I10 Essential (primary) hypertension: Secondary | ICD-10-CM

## 2023-08-30 ENCOUNTER — Ambulatory Visit: Payer: BC Managed Care – PPO

## 2023-10-18 ENCOUNTER — Ambulatory Visit: Payer: BC Managed Care – PPO

## 2023-10-18 DIAGNOSIS — Z1231 Encounter for screening mammogram for malignant neoplasm of breast: Secondary | ICD-10-CM

## 2023-10-19 ENCOUNTER — Encounter: Payer: Self-pay | Admitting: Sports Medicine

## 2023-10-20 ENCOUNTER — Other Ambulatory Visit: Payer: Self-pay | Admitting: Sports Medicine

## 2023-10-20 DIAGNOSIS — R928 Other abnormal and inconclusive findings on diagnostic imaging of breast: Secondary | ICD-10-CM

## 2023-11-02 ENCOUNTER — Ambulatory Visit
Admission: RE | Admit: 2023-11-02 | Discharge: 2023-11-02 | Disposition: A | Discharge status: Home / Self Care | Payer: BC Managed Care – PPO | Source: Ambulatory Visit | Attending: Sports Medicine | Admitting: Sports Medicine

## 2023-11-02 ENCOUNTER — Other Ambulatory Visit: Payer: Self-pay | Admitting: Sports Medicine

## 2023-11-02 DIAGNOSIS — R928 Other abnormal and inconclusive findings on diagnostic imaging of breast: Secondary | ICD-10-CM

## 2023-11-02 DIAGNOSIS — R921 Mammographic calcification found on diagnostic imaging of breast: Secondary | ICD-10-CM

## 2023-12-14 ENCOUNTER — Encounter: Payer: Self-pay | Admitting: Sports Medicine

## 2023-12-14 ENCOUNTER — Ambulatory Visit

## 2023-12-14 ENCOUNTER — Ambulatory Visit: Admitting: Sports Medicine

## 2023-12-14 DIAGNOSIS — M79662 Pain in left lower leg: Secondary | ICD-10-CM | POA: Diagnosis not present

## 2023-12-14 DIAGNOSIS — M545 Low back pain, unspecified: Secondary | ICD-10-CM | POA: Diagnosis not present

## 2023-12-14 NOTE — Progress Notes (Signed)
    Procedures performed today:    None.  Independent interpretation of notes and tests performed by another provider:   None.  Brief History, Exam, Impression, and Recommendations:    Pain in left shin This is a very pleasant 58 year old female, she has had several weeks of discomfort of left anterolateral shin from mid lower leg down to the ankle. No trauma definitively leading up to this discomfort. No numbness or tingling. She has known lumbar DDD. Her exam is completely normal, negative Homans' sign, no visible swelling, she has good motion, good strength with her ankle, good stability. Sensation is grossly intact. Vascular status is intact. I suspect this is likely more radicular, she is not hurting enough to do medications so we will have her do some x-rays, home physical therapy for herniated disc and return to see me in about 6 weeks as needed.    ____________________________________________ Ihor Austin. Benjamin Stain, M.D., ABFM., CAQSM., AME. Primary Care and Sports Medicine [facility]  Adjunct Professor of Family Medicine  [place] [employer]  Restaurant manager, fast food

## 2023-12-14 NOTE — Assessment & Plan Note (Signed)
 This is a very pleasant 58 year old female, she has had several weeks of discomfort of left anterolateral shin from mid lower leg down to the ankle. No trauma definitively leading up to this discomfort. No numbness or tingling. She has known lumbar DDD. Her exam is completely normal, negative Homans' sign, no visible swelling, she has good motion, good strength with her ankle, good stability. Sensation is grossly intact. Vascular status is intact. I suspect this is likely more radicular, she is not hurting enough to do medications so we will have her do some x-rays, home physical therapy for herniated disc and return to see me in about 6 weeks as needed.

## 2024-01-25 ENCOUNTER — Ambulatory Visit: Admitting: Sports Medicine

## 2024-02-20 ENCOUNTER — Telehealth: Payer: Self-pay | Admitting: *Deleted

## 2024-02-20 NOTE — Telephone Encounter (Signed)
 Returned call from 3:14 PM. No voicemail set up.

## 2024-02-21 ENCOUNTER — Other Ambulatory Visit: Payer: Self-pay | Admitting: Sports Medicine

## 2024-03-12 ENCOUNTER — Ambulatory Visit: Admitting: Obstetrics and Gynecology

## 2024-03-12 ENCOUNTER — Other Ambulatory Visit (HOSPITAL_COMMUNITY)
Admission: RE | Admit: 2024-03-12 | Discharge: 2024-03-12 | Disposition: A | Discharge status: Home / Self Care | Source: Ambulatory Visit | Attending: Obstetrics and Gynecology | Admitting: Obstetrics and Gynecology

## 2024-03-12 ENCOUNTER — Encounter: Payer: Self-pay | Admitting: Obstetrics and Gynecology

## 2024-03-12 VITALS — BP 124/82 | HR 98 | Ht 64.0 in | Wt 241.0 lb

## 2024-03-12 DIAGNOSIS — Z01419 Encounter for gynecological examination (general) (routine) without abnormal findings: Secondary | ICD-10-CM

## 2024-03-12 DIAGNOSIS — Z1231 Encounter for screening mammogram for malignant neoplasm of breast: Secondary | ICD-10-CM

## 2024-03-12 DIAGNOSIS — Z124 Encounter for screening for malignant neoplasm of cervix: Secondary | ICD-10-CM | POA: Diagnosis not present

## 2024-03-12 NOTE — Progress Notes (Signed)
 GYNECOLOGY ANNUAL PREVENTATIVE CARE ENCOUNTER NOTE  Subjective:   Wanda Valencia is a 58 y.o. G72P2002 female here for a annual gynecologic exam. Current complaints: none.    Denies abnormal vaginal bleeding, discharge, pelvic pain, problems with intercourse or other gynecologic concerns. Declines STI screen.   Gynecologic History Patient's last menstrual period was 09/29/2011 (lmp unknown). Contraception: post menopausal status Last Pap: 2020. Results: normal Last mammogram: 09/2023. Results: follow up 04/2024 DEXA: has never had  Obstetric History OB History  Gravida Para Term Preterm AB Living  2 2 2   2   SAB IAB Ectopic Multiple Live Births          # Outcome Date GA Lbr Len/2nd Weight Sex Type Anes PTL Lv  2 Term           1 Term             Past Medical History:  Diagnosis Date   Arthritis    right knee and thumb (05/08/2015)   Chronic bronchitis (HCC)    born w/it; get it alot   Colon polyp 2016   1 removed    Detached retina, right    Family history of adverse reaction to anesthesia    my sister has reactions to about anything; anesthesia included   Gall stones 04/2018   GERD (gastroesophageal reflux disease)    Hx: UTI (urinary tract infection)    Hypertension    Incontinence    Kidney stones    Obesity    Overactive bladder    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   UTI (urinary tract infection)     Past Surgical History:  Procedure Laterality Date   CESAREAN SECTION  1989; 1991   COLONOSCOPY  2016   Digestive Health Specialist with [facility] in [place]   CYSTOSCOPY W/ STONE MANIPULATION  09/2006   JOINT REPLACEMENT     THUMB FUSION Right 2017   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   Procedure: TOTAL KNEE ARTHROPLASTY;  Surgeon: Neil Balls, MD;  Location: [facility];  Service: Orthopedics;  Laterality: Right;   TUBAL LIGATION  10/14/2011    Current Outpatient Medications on File  Prior to Visit  Medication Sig Dispense Refill   amitriptyline  (ELAVIL ) 50 MG tablet TAKE 1/2 TABLET BY MOUTH AT BEDTIME FOR 1 WEEK THEN TAKE 1 TABLET AT BEDTIME 90 tablet 1   ascorbic acid (VITAMIN C) 500 MG tablet Take 500 mg by mouth.     aspirin  81 MG tablet Take 81 mg by mouth daily.     cetirizine  (ZYRTEC ) 10 MG tablet Take 10 mg by mouth once.     Cholecalciferol (VITAMIN D-3 PO) Take 1 tablet by mouth daily.     FIBER PO Take 2 capsules by mouth daily. Chewable     Omega-3 Fatty Acids (OMEGA 3 PO) Take 1 tablet by mouth daily.     pantoprazole  (PROTONIX ) 40 MG tablet Take 1 tablet (40 mg total) by mouth daily. PATIENT DUE FOR ANNUAL EXAM 90 tablet 0   valsartan -hydrochlorothiazide  (DIOVAN -HCT) 160-25 MG tablet TAKE 1 TABLET BY MOUTH EVERY DAY 90 tablet 3   ALPRAZolam  (XANAX ) 0.25 MG tablet Take 1 tablet (0.25 mg total) by mouth 2 (two) times daily as needed for anxiety. (Patient not taking: Reported on 03/12/2024) 20 tablet 0   metroNIDAZOLE (METROCREAM) 0.75 % cream Apply topically 2 (two) times daily. (Patient not taking: Reported on 03/12/2024)  No current facility-administered medications on file prior to visit.    Allergies  Allergen Reactions   Other     Pt does not do well with swallowing pills Other reaction(s): Other (See Comments) Runny nose and watery eyes.    Social History   Socioeconomic History   Marital status: Married    Spouse name: Not on file   Number of children: 2   Years of education: Not on file   Highest education level: Not on file  Occupational History   Occupation: retired  Tobacco Use   Smoking status: Former    Current packs/day: 0.00    Average packs/day: 0.1 packs/day for 2.0 years (0.2 ttl pk-yrs)    Types: Cigarettes    Start date: 09/26/1981    Quit date: 09/27/1983    Years since quitting: 40.4   Smokeless tobacco: Never  Vaping Use   Vaping status: Never Used  Substance and Sexual Activity   Alcohol use: No   Drug use: No    Sexual activity: Yes    Birth control/protection: Post-menopausal  Other Topics Concern   Not on file  Social History Narrative   Not on file   Social Drivers of Health   Financial Resource Strain: Not on file  Food Insecurity: Not on file  Transportation Needs: Not on file  Physical Activity: Not on file  Stress: Not on file  Social Connections: Unknown (02/04/2022)   Received from [facility]   Social Network    Social Network: Not on file  Intimate Partner Violence: Unknown (12/28/2021)   Received from [facility]   HITS    Physically Hurt: Not on file    Insult or Talk Down To: Not on file    Threaten Physical Harm: Not on file    Scream or Curse: Not on file    Family History  Problem Relation Age of Onset   Hypertension Mother    Depression Mother    Colon polyps Mother    Atrial fibrillation Mother    Irritable bowel syndrome Mother    Barrett's esophagus Mother    Hypertension Father    Cancer Father        tongue/kidney and kidney removed    Diabetes Father    Breast cancer Sister        dx at the age 52   Colon cancer Neg Hx    Esophageal cancer Neg Hx    Stomach cancer Neg Hx    Rectal cancer Neg Hx      The following portions of the patient's history were reviewed and updated as appropriate: allergies, current medications, past family history, past medical history, past social history, past surgical history and problem list.  Review of Systems Pertinent items are noted in HPI.   Objective:  BP 124/82   Pulse 98   Ht 5' 4 (1.626 m)   Wt 241 lb (109.3 kg)   LMP 09/29/2011 (LMP Unknown)   BMI 41.37 kg/m  CONSTITUTIONAL: Well-developed, well-nourished female in no acute distress.  HENT:  Normocephalic, atraumatic, External right and left ear normal. Oropharynx is clear and moist EYES: Conjunctivae and EOM are normal. Pupils are equal, round, and reactive to light. No scleral icterus.  NECK: Normal range of motion, supple, no masses.  Normal  thyroid.  SKIN: Skin is warm and dry. No rash noted. Not diaphoretic. No erythema. No pallor. NEUROLOGIC: Alert and oriented to person, place, and time. Normal reflexes, muscle tone coordination. No cranial nerve deficit  noted. PSYCHIATRIC: Normal mood and affect. Normal behavior. Normal judgment and thought content. CARDIOVASCULAR: Normal heart rate noted , regular rhythm RESPIRATORY: Effort normal, no problems with respiration noted. BREASTS: Symmetric in size. No masses, skin changes, nipple drainage, or lymphadenopathy. ABDOMEN: Soft, no distention noted.  No tenderness, rebound or guarding.  PELVIC: atrophic appearing labia, otherwise normal appearing external female genitalia; normal appearing vaginal mucosa and cervix. Vagina moist and healthy appearing. No abnormal discharge noted.  Pap smear obtained, some bleeding with pap. Normal uterine size, no other palpable masses, no uterine or adnexal tenderness. MUSCULOSKELETAL: Normal range of motion. No tenderness.  No cyanosis, clubbing, or edema.   Exam done with chaperone present.   Assessment and Plan:   1. Well woman exam (Primary) Healthy female exam postmenopausal  2. Cervical cancer screening Reviewed recommendation for cessation of screening age 64 with 20 years normal pap She had abnormal pap 24 years ago, all normal since  3. Encounter for screening mammogram for malignant neoplasm of breast Reviewed screening recommendations for breast cancer - she is UTD   Will follow up results of pap smear and manage accordingly. Encouraged improvement in diet and exercise.  Declines STI screen. Mammogram UTD Referral for colonoscopy n/a, is UTD DEXA not due based on age  Routine preventative health maintenance measures emphasized. Please refer to After Visit Summary for other counseling recommendations.    Larence Pleas, MD, [facility] Attending Center for [facility])

## 2024-03-13 LAB — CYTOLOGY - PAP
Comment: NEGATIVE
Diagnosis: NEGATIVE
High risk HPV: NEGATIVE

## 2024-03-14 ENCOUNTER — Ambulatory Visit: Payer: Self-pay | Admitting: Family Medicine

## 2024-04-12 ENCOUNTER — Ambulatory Visit: Admitting: Sports Medicine

## 2024-04-12 VITALS — BP 108/68 | HR 81 | Resp 20 | Ht 64.0 in | Wt 246.0 lb

## 2024-04-12 DIAGNOSIS — Z6841 Body Mass Index (BMI) 40.0 and over, adult: Secondary | ICD-10-CM

## 2024-04-12 DIAGNOSIS — E66813 Obesity, class 3: Secondary | ICD-10-CM | POA: Diagnosis not present

## 2024-04-12 DIAGNOSIS — N644 Mastodynia: Secondary | ICD-10-CM

## 2024-04-12 DIAGNOSIS — R921 Mammographic calcification found on diagnostic imaging of breast: Secondary | ICD-10-CM

## 2024-04-12 MED ORDER — ZEPBOUND 2.5 MG/0.5ML ~~LOC~~ SOAJ: 2.5000 mg | SUBCUTANEOUS | 0 refills | Status: DC

## 2024-04-12 MED ORDER — ONDANSETRON 8 MG PO TBDP: 8.0000 mg | ORAL_TABLET | Freq: Three times a day (TID) | ORAL | 3 refills | Status: AC | PRN

## 2024-04-12 NOTE — Assessment & Plan Note (Signed)
 Very pleasant 58 year old female, she has struggled with her weight her whole life, she does have multiple comorbidities including knee osteoarthritis, fatty liver disease, hyperlipidemia, hip arthritis, plantar fasciitis. She will be enrolled in a multidisciplinary weight loss program with calorie counting, and exercise prescription, she has no contraindications to GLP-1 treatment, she will not be on any other weight loss medications. Starting Zepbound. If not covered we will try Wegovy, if not covered or plan exclusion we will try compounded tirzepatide. Return to see me 1 month after starting to evaluate tolerance.

## 2024-04-12 NOTE — Progress Notes (Signed)
    Procedures performed today:    None.  Independent interpretation of notes and tests performed by another provider:   None.  Brief History, Exam, Impression, and Recommendations:    Obesity Very pleasant 58 year old female, she has struggled with her weight her whole life, she does have multiple comorbidities including knee osteoarthritis, fatty liver disease, hyperlipidemia, hip arthritis, plantar fasciitis. She will be enrolled in a multidisciplinary weight loss program with calorie counting, and exercise prescription, she has no contraindications to GLP-1 treatment, she will not be on any other weight loss medications. Starting Zepbound. If not covered we will try Wegovy, if not covered or plan exclusion we will try compounded tirzepatide. Return to see me 1 month after starting to evaluate tolerance.    ____________________________________________ Debby PARAS. Curtis, M.D., ABFM., CAQSM., AME. Primary Care and Sports Medicine [facility]  Adjunct Professor of [facility] Medicine  [employer]  Restaurant manager, fast food

## 2024-04-15 ENCOUNTER — Encounter: Payer: Self-pay | Admitting: Sports Medicine

## 2024-04-15 DIAGNOSIS — E669 Obesity, unspecified: Secondary | ICD-10-CM

## 2024-04-16 NOTE — Telephone Encounter (Signed)
 To Rx prior Auth team

## 2024-04-17 ENCOUNTER — Other Ambulatory Visit (HOSPITAL_COMMUNITY): Payer: Self-pay

## 2024-04-17 ENCOUNTER — Telehealth: Payer: Self-pay

## 2024-04-17 NOTE — Telephone Encounter (Signed)
 Called [facility], and they stated they had filled the Zepbound  for the patient and the copay is $1,052.79. The insurance accepted the claim but it has not been picked up.    Called her insurance and per the rep, the copay is $1,052.79 and it will go towards her deductible and the patient is able to use the Zepbound  coupon to help lower the cost.  Patient can go to the Zepbound  website at [email or web address]

## 2024-04-17 NOTE — Telephone Encounter (Signed)
 Looking at the pharmacy prior Auth team note it looks like they said she filled it already, did the price come down?

## 2024-04-17 NOTE — Telephone Encounter (Signed)
 Pharmacy Patient Advocate Encounter   Received notification from Patient Advice Request messages that prior authorization for Zepbound  2.5mg /0.52ml is required/requested.   Insurance verification completed.   The patient is insured through [facility] .   Per test claim: Refill too soon. PA is not needed at this time. Medication was filled 04/12/24. Next eligible fill date is 05/03/24.

## 2024-04-18 NOTE — Telephone Encounter (Signed)
 Spoke with patient. States she will speak with her husband and if she decides to proceed with the compounded medication she will send a message via Mychart.

## 2024-04-18 NOTE — Telephone Encounter (Signed)
 Okay that changes things knowing that it is not a covered medication.  The next option would be compounded tirzepatide  at [facility] in [place], it is about $250 for a month supply, it is cash pay, no insurance needed.  If this is within reach I am happy to call it in.

## 2024-04-19 MED ORDER — TIRZEPATIDE 10 MG/0.5ML ~~LOC~~ SOAJ: SUBCUTANEOUS | 11 refills | Status: DC

## 2024-05-02 ENCOUNTER — Ambulatory Visit: Payer: Self-pay | Admitting: Sports Medicine

## 2024-05-02 ENCOUNTER — Ambulatory Visit
Admission: RE | Admit: 2024-05-02 | Discharge: 2024-05-02 | Disposition: A | Discharge status: Home / Self Care | Source: Ambulatory Visit | Attending: Sports Medicine | Admitting: Sports Medicine

## 2024-05-02 ENCOUNTER — Ambulatory Visit

## 2024-05-02 ENCOUNTER — Other Ambulatory Visit: Payer: Self-pay | Admitting: Sports Medicine

## 2024-05-02 ENCOUNTER — Other Ambulatory Visit (HOSPITAL_COMMUNITY)
Admission: RE | Admit: 2024-05-02 | Discharge: 2024-05-02 | Disposition: A | Discharge status: Home / Self Care | Source: Ambulatory Visit | Attending: Obstetrics and Gynecology | Admitting: Obstetrics and Gynecology

## 2024-05-02 VITALS — BP 124/77 | HR 88 | Ht 64.0 in | Wt 241.0 lb

## 2024-05-02 DIAGNOSIS — R921 Mammographic calcification found on diagnostic imaging of breast: Secondary | ICD-10-CM

## 2024-05-02 DIAGNOSIS — N898 Other specified noninflammatory disorders of vagina: Secondary | ICD-10-CM

## 2024-05-02 NOTE — Progress Notes (Signed)
 SUBJECTIVE:  58 y.o. female complains of light yellow vaginal discharge for a few week(s). Denies abnormal vaginal bleeding or significant pelvic pain or fever. No UTI symptoms. Denies history of known exposure to STD.  Patient's last menstrual period was 09/29/2011 (lmp unknown).  OBJECTIVE:    ASSESSMENT:  Vaginal Discharge    PLAN:  , BVAG, CVAG probe sent to lab. Treatment: To be determined once lab results are received

## 2024-05-02 NOTE — Addendum Note (Signed)
 Addended by: CURTIS DEBBY PARAS on: 05/02/2024 02:48 PM   Modules accepted: Orders

## 2024-05-08 ENCOUNTER — Telehealth: Payer: Self-pay

## 2024-05-08 ENCOUNTER — Ambulatory Visit: Payer: Self-pay | Admitting: Obstetrics and Gynecology

## 2024-05-08 DIAGNOSIS — B9689 Other specified bacterial agents as the cause of diseases classified elsewhere: Secondary | ICD-10-CM

## 2024-05-08 LAB — CERVICOVAGINAL ANCILLARY ONLY
Bacterial Vaginitis (gardnerella): POSITIVE — AB
Candida Glabrata: NEGATIVE
Candida Vaginitis: NEGATIVE
Comment: NEGATIVE
Comment: NEGATIVE
Comment: NEGATIVE

## 2024-05-08 MED ORDER — METRONIDAZOLE 0.75 % VA GEL
1.0000 | Freq: Every day | VAGINAL | 1 refills | Status: DC
Start: 2024-05-08 — End: 2024-05-20

## 2024-05-08 NOTE — Telephone Encounter (Signed)
 RN attempted to call patient at both numbers listed for her to notify of medication sent by provider, no voicemail for either of the numbers, unable to leave message. RN sent [employer].   Silvano LELON Piano, RN

## 2024-05-20 ENCOUNTER — Telehealth: Payer: Self-pay

## 2024-05-20 ENCOUNTER — Ambulatory Visit (INDEPENDENT_AMBULATORY_CARE_PROVIDER_SITE_OTHER): Admitting: Urgent Care

## 2024-05-20 ENCOUNTER — Ambulatory Visit: Admitting: Sports Medicine

## 2024-05-20 ENCOUNTER — Encounter: Payer: Self-pay | Admitting: Urgent Care

## 2024-05-20 VITALS — BP 107/73 | HR 86 | Resp 18 | Ht 64.0 in | Wt 240.2 lb

## 2024-05-20 DIAGNOSIS — E66813 Obesity, class 3: Secondary | ICD-10-CM | POA: Diagnosis not present

## 2024-05-20 DIAGNOSIS — R400 Somnolence: Secondary | ICD-10-CM | POA: Diagnosis not present

## 2024-05-20 DIAGNOSIS — R0683 Snoring: Secondary | ICD-10-CM | POA: Diagnosis not present

## 2024-05-20 DIAGNOSIS — Z6841 Body Mass Index (BMI) 40.0 and over, adult: Secondary | ICD-10-CM

## 2024-05-20 MED ORDER — TIRZEPATIDE-WEIGHT MANAGEMENT 5 MG/0.5ML ~~LOC~~ SOLN: 5.0000 mg | SUBCUTANEOUS | 2 refills | Status: DC

## 2024-05-20 NOTE — Telephone Encounter (Signed)
 Copied from CRM [id number]. Topic: Clinical - Prescription Issue >> May 20, 2024 11:56 AM Graeme ORN wrote: Reason for CRM: Lauraine called from pharmacy states they received tirzepatide  5 MG/0.5ML injection vial -unable to fill as is. Needs to be corrected and sent back with note for Liopslim.  Formula = Liposlim add additional ingredients: tirzepatide  10mg /ml vit b6 10mg /ml, vit b1 10mg /ml, and  l-carnitine - directions stay the same - put in note liposlim ok. Thank You

## 2024-05-20 NOTE — Progress Notes (Signed)
 Established Patient Office Visit  Subjective:  Patient ID: Wanda Valencia, female    DOB: Jun 16, 1966  Age: 58 y.o. MRN: [id number]  Chief Complaint  Patient presents with   Medical Management of Chronic Issues    Mounjaro  f/u    HPI  Discussed the use of AI scribe software for clinical note transcription with the patient, who gave verbal consent to proceed.  History of Present Illness   Wanda Valencia is a 58 year old female who presents for a follow-up visit regarding her weight loss treatment with Mounjaro .  She has been on Mounjaro  for one month, starting at a dose of 2.5 mg. Initially, she experienced constipation during the first two weeks, which has since resolved. No nausea or sickness is reported, and she has noticed a change in her appetite, leading to reduced food intake. She has been avoiding sugars and processed foods, focusing on fruits and vegetables. Her typical daily intake includes yogurt for breakfast and a banana for lunch, with no significant hunger throughout the day. She reports a weight loss of six pounds since starting Mounjaro .  Her blood sugar levels have historically been in the seventies and eighties, and she experiences increased sleepiness around 3 PM, which she suspects might be related to low blood sugar. However, she denies symptoms of hypoglycemia such as shakiness or sweating. Her last A1c was 5.7% in October 2023, and it has remained stable for the past eleven years. She is not diabetic and is using Mounjaro  specifically for weight loss.  She has a history of snoring, which she associates with weight fluctuations, particularly when her weight exceeds 218 pounds. She reports that she sleeps well at night and has not had a sleep study before, and her husband has reported that she snores. She sleeps well at night without waking up with a sore throat or headaches. Does have daytime somnolence.  Her current medications include Mounjaro  at 2.5 mg, and  she is not on any cholesterol medication. She uses metronidazole  cream for her face and has discontinued the vaginal gel.       Patient Active Problem List   Diagnosis Date Noted   Pain in left shin 12/14/2023   Facial rash 07/01/2022   Grieving 07/01/2022   Medial epicondylitis, right 11/09/2021   Abdominal pain in female 08/09/2021   Dermatitis 03/08/2021   Primary osteoarthritis of left hip 02/17/2021   Overactive bladder 02/08/2021   Breast calcifications 03/31/2020   Piezogenic pedal papule, 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   Primary osteoarthritis of left knee 09/03/2018   DDD (degenerative disc disease), cervical 07/16/2018   History of acute pyelonephritis 04/06/2018   Pes anserine bursitis 01/18/2018   Rhinitis 11/28/2016   History of arthroplasty of right knee 05/08/2015   Plantar fasciitis, left 08/25/2014   Fatty liver disease, nonalcoholic 06/17/2014   Biliary colic 06/17/2014   History of kidney stones 05/20/2014   Hyperlipidemia with target low density lipoprotein (LDL) cholesterol less than 100 mg/dL 96/93/7985   Annual physical exam 07/17/2012   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   Obesity 07/17/2012   Essential hypertension, benign 07/17/2012   Right first Metacarpophalangeal joint pain 04/30/2012   Past Medical History:  Diagnosis Date   Arthritis    right knee and thumb (05/08/2015)   Cataract    Chronic bronchitis (HCC)    born w/it; get it alot   Colon polyp 2016   1 removed    Detached retina, right    Family history of adverse reaction  to anesthesia    my sister has reactions to about anything; anesthesia included   Gall stones 04/2018   GERD (gastroesophageal reflux disease)    Hx: UTI (urinary tract infection)    Hypertension    Incontinence    Kidney stones    Obesity    Overactive bladder    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   UTI (urinary tract infection)    Past Surgical History:  Procedure Laterality Date   CESAREAN  SECTION  1989; 1991   CHOLECYSTECTOMY     COLONOSCOPY  2016   [facility] with [facility] in [place]   CYSTOSCOPY W/ STONE MANIPULATION  09/2006   EYE SURGERY     JOINT REPLACEMENT     THUMB FUSION Right 2017   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   Procedure: TOTAL KNEE ARTHROPLASTY;  Surgeon: Norleen Gavel, MD;  Location: [facility] OR;  Service: Orthopedics;  Laterality: Right;   TUBAL LIGATION  10/14/2011   Social History   Tobacco Use   Smoking status: Former    Current packs/day: 0.00    Average packs/day: 0.1 packs/day for 2.0 years (0.2 ttl pk-yrs)    Types: Cigarettes    Start date: 09/26/1981    Quit date: 09/27/1983    Years since quitting: 40.6   Smokeless tobacco: Never  Vaping Use   Vaping status: Never Used  Substance Use Topics   Alcohol use: No   Drug use: No      ROS: as noted in HPI  Objective:     BP 107/73   Pulse 86   Resp 18   Ht 5' 4 (1.626 m)   Wt 240 lb 4 oz (109 kg)   LMP 09/29/2011 (LMP Unknown)   SpO2 96%   BMI 41.24 kg/m  BP Readings from Last 3 Encounters:  05/20/24 107/73  05/02/24 124/77  04/12/24 108/68   Wt Readings from Last 3 Encounters:  05/20/24 240 lb 4 oz (109 kg)  05/02/24 241 lb (109.3 kg)  04/12/24 246 lb (111.6 kg)      Physical Exam Vitals and nursing note reviewed.  Constitutional:      General: She is not in acute distress.    Appearance: Normal appearance. She is not ill-appearing, toxic-appearing or diaphoretic.  HENT:     Head: Normocephalic and atraumatic.     Mouth/Throat:     Mouth: Mucous membranes are moist.     Pharynx: Oropharynx is clear. No oropharyngeal exudate or posterior oropharyngeal erythema.     Comments: Mallampati class 3 Eyes:     General: No scleral icterus.       Right eye: No discharge.        Left eye: No discharge.     Extraocular Movements: Extraocular movements intact.     Pupils: Pupils are equal, round, and reactive  to light.  Cardiovascular:     Rate and Rhythm: Normal rate.  Pulmonary:     Effort: Pulmonary effort is normal. No respiratory distress.  Musculoskeletal:     Cervical back: Normal range of motion. No rigidity.  Skin:    General: Skin is warm and dry.     Coloration: Skin is not jaundiced.     Findings: No bruising, erythema or rash.  Neurological:     General: No focal deficit present.     Mental Status: She is alert and oriented to person, place, and time.  Gait: Gait normal.  Psychiatric:        Mood and Affect: Mood normal.        Behavior: Behavior normal.      No results found for any visits on 05/20/24.  Last CBC Lab Results  Component Value Date   WBC 4.7 07/01/2022   HGB 14.3 07/01/2022   HCT 42.5 07/01/2022   MCV 93.2 07/01/2022   MCH 31.4 07/01/2022   RDW 12.5 07/01/2022   PLT 325 07/01/2022   Last metabolic panel Lab Results  Component Value Date   GLUCOSE 92 07/01/2022   NA 141 07/01/2022   K 4.5 07/01/2022   CL 104 07/01/2022   CO2 29 07/01/2022   BUN 17 07/01/2022   CREATININE 1.05 (H) 07/01/2022   EGFR 62 07/01/2022   CALCIUM 10.2 07/01/2022   PROT 7.0 07/01/2022   ALBUMIN 4.1 12/05/2015   BILITOT 0.6 07/01/2022   ALKPHOS 75 12/05/2015   AST 34 07/01/2022   ALT 32 (H) 07/01/2022   ANIONGAP 8 04/23/2018   Last lipids Lab Results  Component Value Date   CHOL 207 (H) 07/01/2022   HDL 42 (L) 07/01/2022   LDLCALC 134 (H) 07/01/2022   TRIG 169 (H) 07/01/2022   CHOLHDL 4.9 07/01/2022   Last hemoglobin A1c Lab Results  Component Value Date   HGBA1C 5.7 (H) 07/01/2022      The 10-year ASCVD risk score (Arnett DK, et al., 2019) is: 3.8%  Assessment & Plan:  Class 3 severe obesity with serious comorbidity and body mass index (BMI) of 40.0 to 44.9 in adult, unspecified obesity type -     Tirzepatide -Weight Management; Inject 5 mg into the skin once a week.  Dispense: 2 mL; Refill: 2 -     CBC with Differential/Platelet -      Hemoglobin A1c -     TSH -     Lipid panel -     Comprehensive metabolic panel with GFR  Snoring -     Ambulatory referral to Sleep Studies  Daytime somnolence -     Ambulatory referral to Sleep Studies  Assessment and Plan    Daytime somnolence and suspected obstructive sleep apnea Daytime somnolence likely related to obstructive sleep apnea. Malampatti class III increases likelihood. Discussed benefits of treating sleep apnea, including reduced cardiovascular risks and improved weight management. - Order home sleep study through [facility].  Obesity Obesity managed with tirzepatide . Six-pound weight loss on 2.5 mg dose. No adverse effects reported. Discussed increasing dose to 5 mg and realistic weight loss goals. - Increase tirzepatide  dose to 5 mg if tolerated. - Monitor weight loss and side effects. - Encourage continued dietary modifications. - Order lab work to update baseline values.  Hypertension Hypertension well-controlled with current medication.  General Health Maintenance Routine health maintenance discussed. No issues with cholesterol management. Blood pressure well-controlled. - Update labs to assess baseline values.         Return in about 3 months (around 08/20/2024).   Benton LITTIE Gave, PA

## 2024-05-20 NOTE — Patient Instructions (Addendum)
 Please go up to the 5mg  weekly injection of tirzepatide . Monitor your weight - goal is about 8# per month. Let me know in one month if we need to increase or adjust your dose.  I have ordered a home sleep study through [facility]. They will contact you to perform your sleep test.  We obtained your labs today.   Please schedule a follow up in 3 months sooner as needed

## 2024-05-21 ENCOUNTER — Ambulatory Visit: Payer: Self-pay | Admitting: Urgent Care

## 2024-05-21 LAB — COMPREHENSIVE METABOLIC PANEL WITH GFR
ALT: 63 IU/L — ABNORMAL HIGH (ref 0–32)
AST: 59 IU/L — ABNORMAL HIGH (ref 0–40)
Albumin: 4.2 g/dL (ref 3.8–4.9)
Alkaline Phosphatase: 83 IU/L (ref 44–121)
BUN/Creatinine Ratio: 18 (ref 12–28)
BUN: 19 mg/dL (ref 8–27)
Bilirubin Total: 0.3 mg/dL (ref 0.0–1.2)
CO2: 24 mmol/L (ref 20–29)
Calcium: 9.5 mg/dL (ref 8.7–10.3)
Chloride: 100 mmol/L (ref 96–106)
Creatinine, Ser: 1.04 mg/dL — ABNORMAL HIGH (ref 0.57–1.00)
Globulin, Total: 2.1 g/dL (ref 1.5–4.5)
Glucose: 88 mg/dL (ref 70–99)
Potassium: 4.2 mmol/L (ref 3.5–5.2)
Sodium: 141 mmol/L (ref 134–144)
Total Protein: 6.3 g/dL (ref 6.0–8.5)
eGFR: 62 mL/min/1.73 (ref >59)

## 2024-05-21 LAB — HEMOGLOBIN A1C
Est. average glucose Bld gHb Est-mCnc: 117 mg/dL
Hgb A1c MFr Bld: 5.7 % — ABNORMAL HIGH (ref 4.8–5.6)

## 2024-05-21 LAB — CBC WITH DIFFERENTIAL/PLATELET
Basophils Absolute: 0.1 x10E3/uL (ref 0.0–0.2)
Basos: 1 %
EOS (ABSOLUTE): 0.6 x10E3/uL — ABNORMAL HIGH (ref 0.0–0.4)
Eos: 10 %
Hematocrit: 45.3 % (ref 34.0–46.6)
Hemoglobin: 14.7 g/dL (ref 11.1–15.9)
Immature Grans (Abs): 0 x10E3/uL (ref 0.0–0.1)
Immature Granulocytes: 0 %
Lymphocytes Absolute: 1.3 x10E3/uL (ref 0.7–3.1)
Lymphs: 22 %
MCH: 31.3 pg (ref 26.6–33.0)
MCHC: 32.5 g/dL (ref 31.5–35.7)
MCV: 96 fL (ref 79–97)
Monocytes Absolute: 0.7 x10E3/uL (ref 0.1–0.9)
Monocytes: 12 %
Neutrophils Absolute: 3.3 x10E3/uL (ref 1.4–7.0)
Neutrophils: 55 %
Platelets: 281 x10E3/uL (ref 150–450)
RBC: 4.7 x10E6/uL (ref 3.77–5.28)
RDW: 12.9 % (ref 11.7–15.4)
WBC: 6.1 x10E3/uL (ref 3.4–10.8)

## 2024-05-21 LAB — TSH: TSH: 3.16 u[IU]/mL (ref 0.450–4.500)

## 2024-05-21 LAB — LIPID PANEL
Chol/HDL Ratio: 4.3 ratio (ref 0.0–4.4)
Cholesterol, Total: 155 mg/dL (ref 100–199)
HDL: 36 mg/dL — ABNORMAL LOW (ref >39)
LDL Chol Calc (NIH): 92 mg/dL (ref 0–99)
Triglycerides: 153 mg/dL — ABNORMAL HIGH (ref 0–149)
VLDL Cholesterol Cal: 27 mg/dL (ref 5–40)

## 2024-05-21 MED ORDER — TIRZEPATIDE-WEIGHT MANAGEMENT 10 MG/0.5ML ~~LOC~~ SOLN: 5.0000 mg | SUBCUTANEOUS | 3 refills | Status: DC

## 2024-05-23 MED ORDER — PANTOPRAZOLE SODIUM 40 MG PO TBEC: 40.0000 mg | DELAYED_RELEASE_TABLET | Freq: Every day | ORAL | 1 refills | Status: DC

## 2024-05-23 NOTE — Telephone Encounter (Signed)
 Patient requesting rx rf of Pantoprazole  40mg   Last written 02/21/2024 Last OV 05/20/2024 Upcoming appt 08/20/2024

## 2024-05-28 ENCOUNTER — Encounter: Payer: Self-pay | Admitting: Sports Medicine

## 2024-06-06 ENCOUNTER — Encounter: Payer: Self-pay | Admitting: Urgent Care

## 2024-06-27 ENCOUNTER — Encounter: Payer: Self-pay | Admitting: Urgent Care

## 2024-06-27 DIAGNOSIS — G4733 Obstructive sleep apnea (adult) (pediatric): Secondary | ICD-10-CM

## 2024-07-08 ENCOUNTER — Encounter: Payer: Self-pay | Admitting: Urgent Care

## 2024-07-08 MED ORDER — PANTOPRAZOLE SODIUM 40 MG PO TBEC: 40.0000 mg | DELAYED_RELEASE_TABLET | Freq: Two times a day (BID) | ORAL | 1 refills | Status: DC

## 2024-07-09 MED ORDER — PANTOPRAZOLE SODIUM 20 MG PO TBEC: 20.0000 mg | DELAYED_RELEASE_TABLET | Freq: Two times a day (BID) | ORAL | 2 refills | Status: AC

## 2024-07-09 NOTE — Addendum Note (Signed)
 Addended by: Evone Arseneau on: 07/09/2024 02:29 PM   Modules accepted: Orders

## 2024-08-20 ENCOUNTER — Ambulatory Visit: Admitting: Urgent Care

## 2024-08-21 ENCOUNTER — Encounter: Payer: Self-pay | Admitting: Urgent Care

## 2024-08-21 ENCOUNTER — Ambulatory Visit: Admitting: Urgent Care

## 2024-08-21 DIAGNOSIS — I10 Essential (primary) hypertension: Secondary | ICD-10-CM

## 2024-08-26 ENCOUNTER — Ambulatory Visit: Admitting: Urgent Care

## 2024-08-26 ENCOUNTER — Encounter: Payer: Self-pay | Admitting: Urgent Care

## 2024-08-26 VITALS — BP 99/64 | HR 80 | Ht 64.0 in | Wt 222.0 lb

## 2024-08-26 DIAGNOSIS — I10 Essential (primary) hypertension: Secondary | ICD-10-CM | POA: Diagnosis not present

## 2024-08-26 DIAGNOSIS — E66813 Obesity, class 3: Secondary | ICD-10-CM | POA: Diagnosis not present

## 2024-08-26 DIAGNOSIS — N3281 Overactive bladder: Secondary | ICD-10-CM | POA: Diagnosis not present

## 2024-08-26 DIAGNOSIS — Z23 Encounter for immunization: Secondary | ICD-10-CM

## 2024-08-26 DIAGNOSIS — Z6841 Body Mass Index (BMI) 40.0 and over, adult: Secondary | ICD-10-CM

## 2024-08-26 MED ORDER — TIRZEPATIDE-WEIGHT MANAGEMENT 10 MG/0.5ML ~~LOC~~ SOLN: 75.0000 mg | SUBCUTANEOUS | 3 refills | Status: AC

## 2024-08-26 MED ORDER — VALSARTAN-HYDROCHLOROTHIAZIDE 160-12.5 MG PO TABS: 1.0000 | ORAL_TABLET | Freq: Every day | ORAL | 3 refills | Status: AC

## 2024-08-26 MED ORDER — AMITRIPTYLINE HCL 50 MG PO TABS: 25.0000 mg | ORAL_TABLET | Freq: Every day | ORAL | 1 refills | Status: AC

## 2024-08-26 NOTE — Patient Instructions (Addendum)
 I have called in your blood pressure medication, only at a lower hydrochlorothiazide  dose (previously at 25mg , now 12.5mg )  We may be able to get rid of the hydrochlorothiazide  component completely with continue weight loss and use of your CPAP machine.  Please continue to monitor your blood pressure readings at home, send me the results around 12/14.  Please return in about 4 months for fasting labs

## 2024-08-26 NOTE — Telephone Encounter (Signed)
 Requesting rx rf of Valsartan  / hydrochlorothiazide  160/25mg  Last written 08/18/2023 Last OV 05/20/2024 Upcoming appt -patient is being seen in office today 08/26/2024

## 2024-08-26 NOTE — Progress Notes (Signed)
 Established Patient Office Visit  Subjective:  Patient ID: Wanda Valencia, female    DOB: [birth year]  Age: 58 y.o. MRN: [id number]  Chief Complaint  Patient presents with   Weight Check    HPI  Discussed the use of AI scribe software for clinical note transcription with the patient, who gave verbal consent to proceed.  History of Present Illness   Wanda Valencia is a 58 year old female with hypertension who presents for a medication refill and blood pressure management.  She has experienced significant weight loss, which has coincided with a decrease in her blood pressure readings. In August, her blood pressure was recorded at 124 mmHg, later dropping to 107 mmHg, and currently it is at 99 mmHg. No lightheadedness or symptoms of hypotension. She is currently on valsartan  160 mg and hydrochlorothiazide  25 mg.  She is on tirzepatide  for weight management, having started at 25 mg and currently on 50 mg. She has not experienced any adverse effects such as abdominal pain or diarrhea. She administers her shots on Monday nights.  Her current medication regimen includes pantoprazole , Zofran  as needed for nausea, amitriptyline  25 mg at night for bladder control, vitamin C, baby aspirin , Zyrtec , vitamin D3, fiber, omega-3, and Metro cream. She has not needed Zofran  since starting the tirzepatide .  She has been using a CPAP machine for about two weeks and reports sleeping well with it, achieving up to ten hours of sleep at night. Her breathing events during the night have decreased since starting CPAP therapy. She does not feel different during the day as she was not experiencing daytime fatigue prior to using the CPAP.  Her last labs were done in August, and she is curious about her A1c and other blood work results after being on tirzepatide .       Patient Active Problem List   Diagnosis Date Noted   Pain in left shin 12/14/2023   Facial rash 07/01/2022   Grieving 07/01/2022   Medial  epicondylitis, right 11/09/2021   Abdominal pain in female 08/09/2021   Dermatitis 03/08/2021   Primary osteoarthritis of left hip 02/17/2021   Overactive bladder 02/08/2021   Breast calcifications 03/31/2020   Piezogenic pedal papule, 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   Primary osteoarthritis of left knee 09/03/2018   DDD (degenerative disc disease), cervical 07/16/2018   History of acute pyelonephritis 04/06/2018   Pes anserine bursitis 01/18/2018   Rhinitis 11/28/2016   History of arthroplasty of right knee 05/08/2015   Plantar fasciitis, left 08/25/2014   Fatty liver disease, nonalcoholic 06/17/2014   Biliary colic 06/17/2014   History of kidney stones 05/20/2014   Hyperlipidemia with target low density lipoprotein (LDL) cholesterol less than 100 mg/dL 96/93/7985   Annual physical exam 07/17/2012   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   Obesity 07/17/2012   Essential hypertension, benign 07/17/2012   Right first Metacarpophalangeal joint pain 04/30/2012   Past Medical History:  Diagnosis Date   Arthritis    right knee and thumb (05/08/2015)   Cataract    Chronic bronchitis (HCC)    born w/it; get it alot   Colon polyp 2016   1 removed    Detached retina, right    Family history of adverse reaction to anesthesia    my sister has reactions to about anything; anesthesia included   Gall stones 04/2018   GERD (gastroesophageal reflux disease)    Hx: UTI (urinary tract infection)    Hypertension    Incontinence    Kidney stones  Obesity    Overactive bladder    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   UTI (urinary tract infection)    Past Surgical History:  Procedure Laterality Date   CESAREAN SECTION  1989; 1991   CHOLECYSTECTOMY     COLONOSCOPY  2016   [facility] with [facility] in [place]   CYSTOSCOPY W/ STONE MANIPULATION  09/2006   EYE SURGERY     JOINT REPLACEMENT     THUMB FUSION Right 2017   TOTAL KNEE ARTHROPLASTY Right  05/08/2015   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   Procedure: TOTAL KNEE ARTHROPLASTY;  Surgeon: Norleen Gavel, MD;  Location: [facility] OR;  Service: Orthopedics;  Laterality: Right;   TUBAL LIGATION  10/14/2011   Social History   Tobacco Use   Smoking status: Former    Current packs/day: 0.00    Average packs/day: 0.1 packs/day for 2.0 years (0.2 ttl pk-yrs)    Types: Cigarettes    Start date: 09/26/1981    Quit date: 09/27/1983    Years since quitting: 40.9   Smokeless tobacco: Never  Vaping Use   Vaping status: Never Used  Substance Use Topics   Alcohol use: No   Drug use: No      ROS: as noted in HPI  Objective:     BP 99/64   Pulse 80   Ht 5' 4 (1.626 m)   Wt 222 lb (100.7 kg)   LMP 09/29/2011 (LMP Unknown)   SpO2 96%   BMI 38.11 kg/m  BP Readings from Last 3 Encounters:  08/26/24 99/64  05/20/24 107/73  05/02/24 124/77   Wt Readings from Last 3 Encounters:  08/26/24 222 lb (100.7 kg)  05/20/24 240 lb 4 oz (109 kg)  05/02/24 241 lb (109.3 kg)      Physical Exam Vitals and nursing note reviewed.  Constitutional:      General: She is not in acute distress.    Appearance: Normal appearance. She is not ill-appearing, toxic-appearing or diaphoretic.  HENT:     Head: Normocephalic and atraumatic.     Right Ear: Tympanic membrane, ear canal and external ear normal. There is no impacted cerumen.     Left Ear: Tympanic membrane, ear canal and external ear normal. There is no impacted cerumen.     Nose: Nose normal.     Mouth/Throat:     Mouth: Mucous membranes are moist.     Pharynx: Oropharynx is clear. No oropharyngeal exudate or posterior oropharyngeal erythema.  Eyes:     General: No scleral icterus.       Right eye: No discharge.        Left eye: No discharge.     Extraocular Movements: Extraocular movements intact.     Pupils: Pupils are equal, round, and reactive to light.  Neck:     Thyroid: No thyroid mass, thyromegaly or thyroid tenderness.   Cardiovascular:     Rate and Rhythm: Normal rate and regular rhythm.     Pulses: Normal pulses.     Heart sounds: No murmur heard. Pulmonary:     Effort: Pulmonary effort is normal. No respiratory distress.     Breath sounds: Normal breath sounds. No stridor. No wheezing or rhonchi.  Musculoskeletal:     Cervical back: Normal range of motion and neck supple. No rigidity or tenderness.     Right lower leg: No edema.     Left lower leg: No edema.  Lymphadenopathy:     Cervical:  No cervical adenopathy.  Skin:    General: Skin is warm and dry.     Coloration: Skin is not jaundiced.     Findings: No bruising, erythema or rash.  Neurological:     General: No focal deficit present.     Mental Status: She is alert and oriented to person, place, and time.     Sensory: No sensory deficit.     Motor: No weakness.  Psychiatric:        Mood and Affect: Mood normal.        Behavior: Behavior normal.      No results found for any visits on 08/26/24.  Last CBC Lab Results  Component Value Date   WBC 6.1 05/20/2024   HGB 14.7 05/20/2024   HCT 45.3 05/20/2024   MCV 96 05/20/2024   MCH 31.3 05/20/2024   RDW 12.9 05/20/2024   PLT 281 05/20/2024   Last metabolic panel Lab Results  Component Value Date   GLUCOSE 88 05/20/2024   NA 141 05/20/2024   K 4.2 05/20/2024   CL 100 05/20/2024   CO2 24 05/20/2024   BUN 19 05/20/2024   CREATININE 1.04 (H) 05/20/2024   EGFR 62 05/20/2024   CALCIUM 9.5 05/20/2024   PROT 6.3 05/20/2024   ALBUMIN 4.2 05/20/2024   LABGLOB 2.1 05/20/2024   BILITOT 0.3 05/20/2024   ALKPHOS 83 05/20/2024   AST 59 (H) 05/20/2024   ALT 63 (H) 05/20/2024   ANIONGAP 8 04/23/2018   Last lipids Lab Results  Component Value Date   CHOL 155 05/20/2024   HDL 36 (L) 05/20/2024   LDLCALC 92 05/20/2024   TRIG 153 (H) 05/20/2024   CHOLHDL 4.3 05/20/2024   Last hemoglobin A1c Lab Results  Component Value Date   HGBA1C 5.7 (H) 05/20/2024   Last thyroid  functions Lab Results  Component Value Date   TSH 3.160 05/20/2024   Last vitamin D No results found for: 25OHVITD2, 25OHVITD3, VD25OH Last vitamin B12 and Folate No results found for: VITAMINB12, FOLATE    The 10-year ASCVD risk score (Arnett DK, et al., 2019) is: 2.9%  Assessment & Plan:  Essential hypertension, benign -     Valsartan -hydroCHLOROthiazide ; Take 1 tablet by mouth daily.  Dispense: 90 tablet; Refill: 3  Overactive bladder -     Amitriptyline  HCl; Take 0.5 tablets (25 mg total) by mouth at bedtime.  Dispense: 90 tablet; Refill: 1  Class 3 severe obesity with serious comorbidity and body mass index (BMI) of 40.0 to 44.9 in adult, unspecified obesity type (HCC) -     Tirzepatide -Weight Management; Inject 75 mg into the skin once a week. Liposlim add additional ingredients: tirzepatide  10mg /ml vit b6 10mg /ml, vit b1 10mg /ml, and  l-carnitine. Inject 75 units weekly  Dispense: 2 mL; Refill: 3  Immunization due -     Pneumococcal conjugate vaccine 20-valent  Assessment and Plan    Essential hypertension Blood pressure decreased to 99/63 mmHg with weight loss. Asymptomatic. Potential overtreatment with further weight loss. - Decreased hydrochlorothiazide  to 12.5 mg with valsartan  160 mg. - Monitor home blood pressure, report on December 14th. - Consider discontinuing hydrochlorothiazide  if blood pressure remains low.  Obesity, class 3 Weight loss achieved. Considering tirzepatide  dose increase. No adverse effects reported. - Increased tirzepatide  to 75 mg. - Monitor for adverse effects: abdominal pain, cramping, diarrhea. - Adjust dosage based on tolerance and weight loss.  Overactive bladder Managed with amitriptyline  25 mg. No new symptoms. - Refilled amitriptyline  25 mg  for 90 tablets.  Obstructive sleep apnea, on CPAP Good compliance with CPAP, improved sleep quality. Potential for further weight loss and blood pressure reduction. - Continue CPAP  therapy. - Monitor weight loss and blood pressure changes.  General Health Maintenance Due for pneumococcal vaccine. Declined COVID vaccine. - Administered pneumococcal vaccine.         Return in about 4 months (around 12/25/2024).   Benton LITTIE Gave, PA

## 2024-09-08 ENCOUNTER — Encounter: Payer: Self-pay | Admitting: Urgent Care

## 2024-11-05 ENCOUNTER — Encounter

## 2024-12-30 ENCOUNTER — Ambulatory Visit: Admitting: Urgent Care
